# Patient Record
Sex: Male | Born: 1955 | Race: White | Hispanic: No | Smoking: Former smoker
Health system: Southern US, Community
[De-identification: ages and names within clinical notes are randomized; demographics above are authoritative.]

## PROBLEM LIST (undated history)

## (undated) DIAGNOSIS — G4700 Insomnia, unspecified: Secondary | ICD-10-CM

## (undated) DIAGNOSIS — F419 Anxiety disorder, unspecified: Secondary | ICD-10-CM

## (undated) DIAGNOSIS — J449 Chronic obstructive pulmonary disease, unspecified: Secondary | ICD-10-CM

## (undated) DIAGNOSIS — I10 Essential (primary) hypertension: Secondary | ICD-10-CM

## (undated) DIAGNOSIS — K219 Gastro-esophageal reflux disease without esophagitis: Secondary | ICD-10-CM

## (undated) DIAGNOSIS — Z87891 Personal history of nicotine dependence: Secondary | ICD-10-CM

## (undated) DIAGNOSIS — E119 Type 2 diabetes mellitus without complications: Secondary | ICD-10-CM

## (undated) HISTORY — DX: Insomnia, unspecified: G47.00

## (undated) HISTORY — DX: Chronic obstructive pulmonary disease, unspecified: J44.9

## (undated) HISTORY — DX: Gastro-esophageal reflux disease without esophagitis: K21.9

## (undated) HISTORY — DX: Type 2 diabetes mellitus without complications: E11.9

## (undated) HISTORY — DX: Anxiety disorder, unspecified: F41.9

## (undated) HISTORY — DX: Essential (primary) hypertension: I10

## (undated) HISTORY — DX: Personal history of nicotine dependence: Z87.891

---

## 2001-05-26 HISTORY — PX: HERNIA REPAIR: SHX51

## 2004-10-13 ENCOUNTER — Emergency Department: Payer: Self-pay | Admitting: Emergency Medicine

## 2008-02-14 ENCOUNTER — Other Ambulatory Visit: Payer: Self-pay

## 2008-02-14 ENCOUNTER — Inpatient Hospital Stay: Payer: Self-pay | Admitting: Psychiatry

## 2009-04-20 ENCOUNTER — Emergency Department: Payer: Self-pay | Admitting: Emergency Medicine

## 2009-08-27 ENCOUNTER — Emergency Department: Payer: Self-pay | Admitting: Unknown Physician Specialty

## 2010-01-20 ENCOUNTER — Emergency Department: Payer: Self-pay | Admitting: Internal Medicine

## 2010-08-01 ENCOUNTER — Emergency Department: Payer: Self-pay | Admitting: Emergency Medicine

## 2010-08-11 ENCOUNTER — Emergency Department: Payer: Self-pay | Admitting: Emergency Medicine

## 2010-09-03 ENCOUNTER — Emergency Department: Payer: Self-pay | Admitting: Unknown Physician Specialty

## 2010-09-28 ENCOUNTER — Emergency Department: Payer: Self-pay | Admitting: Emergency Medicine

## 2010-10-26 ENCOUNTER — Emergency Department: Payer: Self-pay | Admitting: Emergency Medicine

## 2010-12-08 ENCOUNTER — Emergency Department: Payer: Self-pay | Admitting: Emergency Medicine

## 2011-01-25 ENCOUNTER — Inpatient Hospital Stay: Payer: Self-pay | Admitting: Psychiatry

## 2011-02-27 ENCOUNTER — Emergency Department: Payer: Self-pay | Admitting: Emergency Medicine

## 2011-04-07 ENCOUNTER — Ambulatory Visit: Payer: Self-pay | Admitting: Adult Health

## 2011-11-09 ENCOUNTER — Emergency Department: Payer: Self-pay | Admitting: Emergency Medicine

## 2011-11-09 LAB — URINALYSIS, COMPLETE
Bacteria: NONE SEEN
Blood: NEGATIVE
Glucose,UR: >=500 mg/dL (ref 0–75)
Leukocyte Esterase: NEGATIVE
Nitrite: NEGATIVE
RBC,UR: <1 /HPF (ref 0–5)
Squamous Epithelial: NONE SEEN

## 2011-11-09 LAB — COMPREHENSIVE METABOLIC PANEL
Albumin: 3 g/dL — ABNORMAL LOW (ref 3.4–5.0)
Alkaline Phosphatase: 93 U/L (ref 50–136)
Anion Gap: 7 (ref 7–16)
BUN: 16 mg/dL (ref 7–18)
Bilirubin,Total: 0.6 mg/dL (ref 0.2–1.0)
Calcium, Total: 8.7 mg/dL (ref 8.5–10.1)
Chloride: 103 mmol/L (ref 98–107)
Creatinine: 0.74 mg/dL (ref 0.60–1.30)
EGFR (Non-African Amer.): >60
Glucose: 269 mg/dL — ABNORMAL HIGH (ref 65–99)
Osmolality: 281 (ref 275–301)
Potassium: 4.2 mmol/L (ref 3.5–5.1)
SGOT(AST): 174 U/L — ABNORMAL HIGH (ref 15–37)
SGPT (ALT): 346 U/L — ABNORMAL HIGH
Sodium: 135 mmol/L — ABNORMAL LOW (ref 136–145)

## 2011-11-09 LAB — CBC
HGB: 14.5 g/dL (ref 13.0–18.0)
MCH: 32.8 pg (ref 26.0–34.0)
Platelet: 111 10*3/uL — ABNORMAL LOW (ref 150–440)
WBC: 6.7 10*3/uL (ref 3.8–10.6)

## 2011-11-09 LAB — TROPONIN I: Troponin-I: <0.02 ng/mL

## 2011-11-09 LAB — CK TOTAL AND CKMB (NOT AT ARMC): CK, Total: 52 U/L (ref 35–232)

## 2012-12-12 ENCOUNTER — Emergency Department: Payer: Self-pay | Admitting: Internal Medicine

## 2012-12-12 LAB — URINALYSIS, COMPLETE
Bacteria: NONE SEEN
Bilirubin,UR: NEGATIVE
Blood: NEGATIVE
Glucose,UR: NEGATIVE mg/dL
Ketone: NEGATIVE
Leukocyte Esterase: NEGATIVE
Nitrite: NEGATIVE
Ph: 5
Protein: NEGATIVE
RBC,UR: <1 /HPF
Specific Gravity: 1.019
Squamous Epithelial: NONE SEEN
WBC UR: 1 /HPF

## 2012-12-12 LAB — COMPREHENSIVE METABOLIC PANEL
Albumin: 3.3 g/dL — ABNORMAL LOW (ref 3.4–5.0)
Alkaline Phosphatase: 96 U/L (ref 50–136)
Anion Gap: 3 — ABNORMAL LOW (ref 7–16)
Bilirubin,Total: 0.8 mg/dL (ref 0.2–1.0)
Calcium, Total: 9.1 mg/dL (ref 8.5–10.1)
Co2: 28 mmol/L (ref 21–32)
Creatinine: 0.98 mg/dL (ref 0.60–1.30)
EGFR (African American): >60
Osmolality: 274 (ref 275–301)
SGPT (ALT): 253 U/L — ABNORMAL HIGH (ref 12–78)

## 2012-12-12 LAB — CBC
HCT: 38.6 % — ABNORMAL LOW (ref 40.0–52.0)
HGB: 13.3 g/dL (ref 13.0–18.0)
MCH: 31.3 pg (ref 26.0–34.0)
MCV: 91 fL (ref 80–100)
Platelet: 144 10*3/uL — ABNORMAL LOW (ref 150–440)
RBC: 4.26 10*6/uL — ABNORMAL LOW (ref 4.40–5.90)
WBC: 8.3 10*3/uL (ref 3.8–10.6)

## 2013-02-01 ENCOUNTER — Ambulatory Visit: Payer: Self-pay | Admitting: Surgery

## 2013-02-01 LAB — CBC WITH DIFFERENTIAL/PLATELET
Basophil #: 0 10*3/uL (ref 0.0–0.1)
Basophil %: 0.5 %
Eosinophil #: 0.2 10*3/uL (ref 0.0–0.7)
Eosinophil %: 2.6 %
HCT: 40.1 % (ref 40.0–52.0)
HGB: 14.3 g/dL (ref 13.0–18.0)
MCHC: 35.8 g/dL (ref 32.0–36.0)
MCV: 93 fL (ref 80–100)
Monocyte %: 6.9 %
Neutrophil %: 59.9 %
RBC: 4.29 10*6/uL — ABNORMAL LOW (ref 4.40–5.90)
WBC: 6.1 10*3/uL (ref 3.8–10.6)

## 2013-02-01 LAB — BASIC METABOLIC PANEL
BUN: 11 mg/dL (ref 7–18)
Chloride: 105 mmol/L (ref 98–107)
Co2: 28 mmol/L (ref 21–32)
Creatinine: 0.86 mg/dL (ref 0.60–1.30)
EGFR (African American): >60
EGFR (Non-African Amer.): >60
Glucose: 150 mg/dL — ABNORMAL HIGH (ref 65–99)
Osmolality: 276 (ref 275–301)
Potassium: 4.3 mmol/L (ref 3.5–5.1)

## 2013-02-01 LAB — HEPATIC FUNCTION PANEL A (ARMC)
Alkaline Phosphatase: 89 U/L (ref 50–136)
Bilirubin,Total: 0.5 mg/dL (ref 0.2–1.0)
SGOT(AST): 137 U/L — ABNORMAL HIGH (ref 15–37)
SGPT (ALT): 215 U/L — ABNORMAL HIGH (ref 12–78)

## 2013-02-01 LAB — PROTIME-INR
INR: 0.9
Prothrombin Time: 12.7 secs (ref 11.5–14.7)

## 2013-02-04 ENCOUNTER — Emergency Department: Payer: Self-pay | Admitting: Emergency Medicine

## 2013-02-04 ENCOUNTER — Ambulatory Visit: Payer: Self-pay | Admitting: Surgery

## 2013-02-05 ENCOUNTER — Emergency Department: Payer: Self-pay | Admitting: Emergency Medicine

## 2013-02-05 LAB — URINALYSIS, COMPLETE
Bacteria: NONE SEEN
Glucose,UR: NEGATIVE mg/dL (ref 0–75)
Hyaline Cast: 3
Nitrite: NEGATIVE
Specific Gravity: 1.023 (ref 1.003–1.030)

## 2013-02-06 LAB — COMPREHENSIVE METABOLIC PANEL
Albumin: 3.1 g/dL — ABNORMAL LOW (ref 3.4–5.0)
Alkaline Phosphatase: 90 U/L (ref 50–136)
BUN: 9 mg/dL (ref 7–18)
Bilirubin,Total: 0.7 mg/dL (ref 0.2–1.0)
Calcium, Total: 8.9 mg/dL (ref 8.5–10.1)
Chloride: 107 mmol/L (ref 98–107)
Co2: 23 mmol/L (ref 21–32)
Creatinine: 0.76 mg/dL (ref 0.60–1.30)
EGFR (African American): >60
EGFR (Non-African Amer.): >60
Osmolality: 273 (ref 275–301)
Potassium: 3.8 mmol/L (ref 3.5–5.1)
SGOT(AST): 91 U/L — ABNORMAL HIGH (ref 15–37)
SGPT (ALT): 144 U/L — ABNORMAL HIGH (ref 12–78)
Sodium: 137 mmol/L (ref 136–145)
Total Protein: 7.7 g/dL (ref 6.4–8.2)

## 2013-02-06 LAB — CBC
HGB: 13 g/dL (ref 13.0–18.0)
MCH: 33 pg (ref 26.0–34.0)
RBC: 3.95 10*6/uL — ABNORMAL LOW (ref 4.40–5.90)
RDW: 13.5 % (ref 11.5–14.5)

## 2013-02-07 ENCOUNTER — Emergency Department: Payer: Self-pay | Admitting: Emergency Medicine

## 2013-02-07 LAB — URINALYSIS, COMPLETE
Bilirubin,UR: NEGATIVE
Nitrite: NEGATIVE
Ph: 6 (ref 4.5–8.0)
RBC,UR: 833 /HPF (ref 0–5)
Specific Gravity: 1.015 (ref 1.003–1.030)
WBC UR: 7 /HPF (ref 0–5)

## 2013-05-18 IMAGING — CT CT HEAD WITHOUT CONTRAST
3 of 4 series · 18 of 30 positions shown, 20 images · non-contrast
Comparison: none

REASON FOR EXAM: FALL, SCALP ABRASIONS
COMMENTS:   May transport without cardiac monitor

[Series 2: without · axial · non-contrast · 0.40mm/px · z∈[+1168,+1278]mm · 8 of 30 slices shown, 10 images (1 of 2)]
[im 4/30  brain]
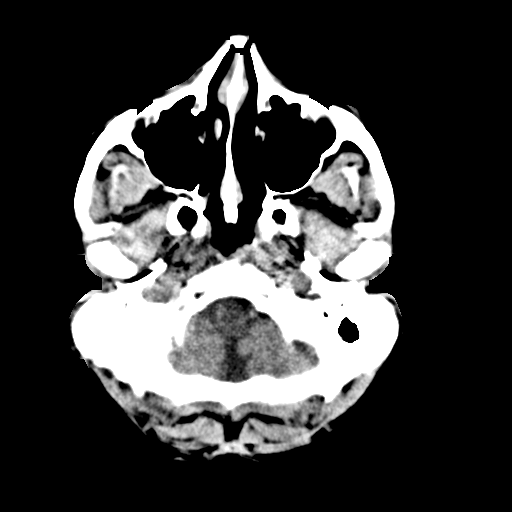
[im 4/30  bone]
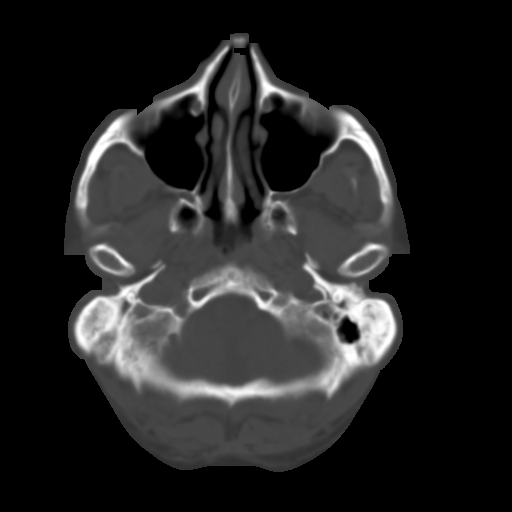
[im 7/30  brain]
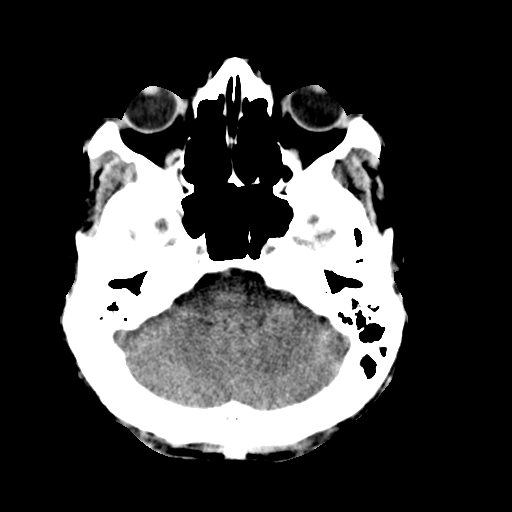
[im 10/30  brain]
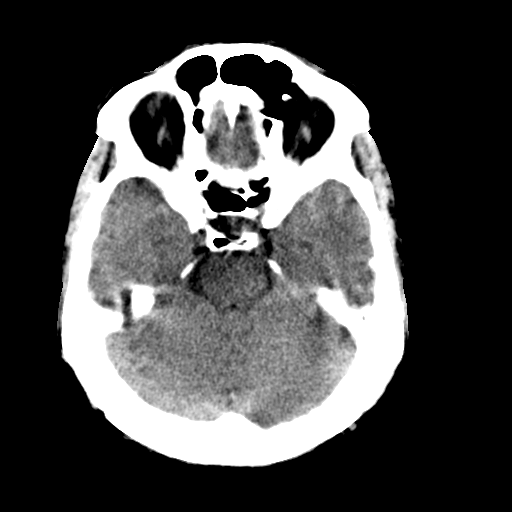
[im 13/30  brain]
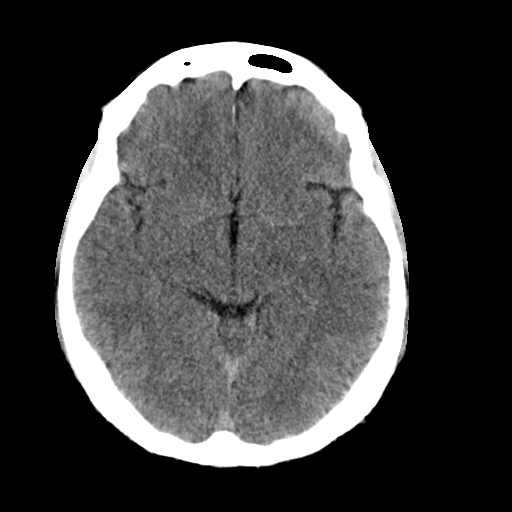
[im 17/30  brain]
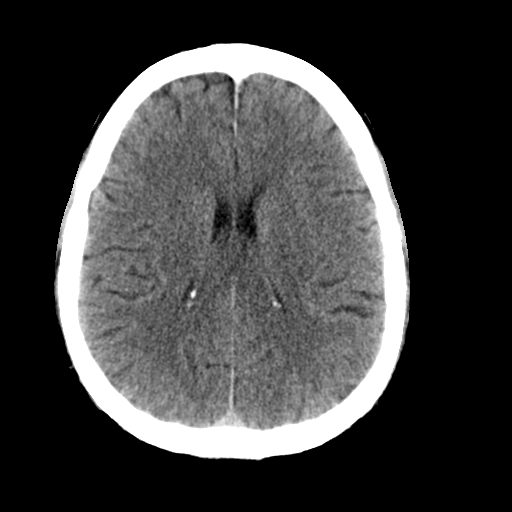
[im 17/30  bone]
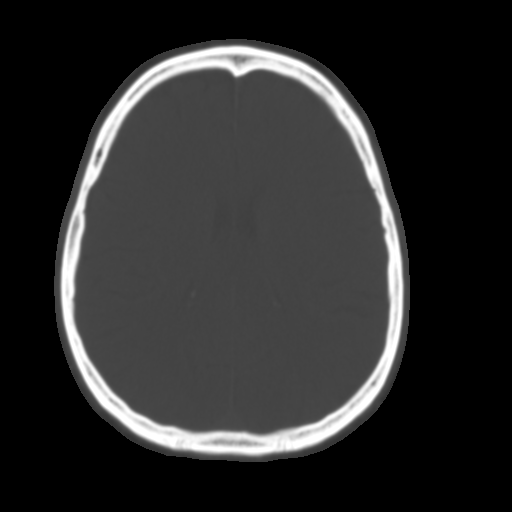
[im 20/30  brain]
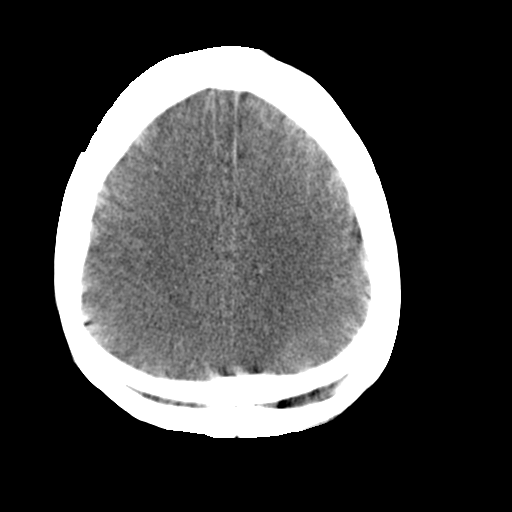
[im 23/30  brain]
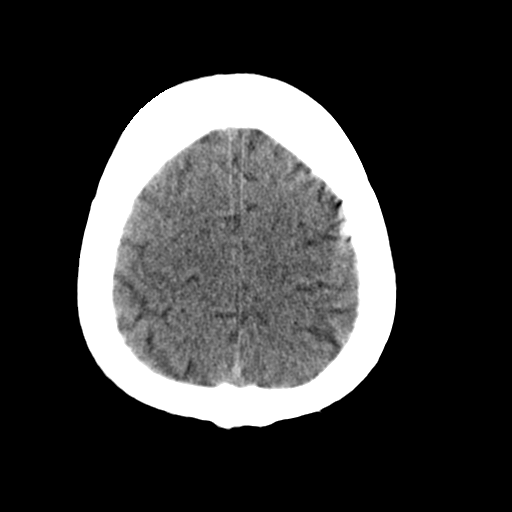
[im 26/30  brain]
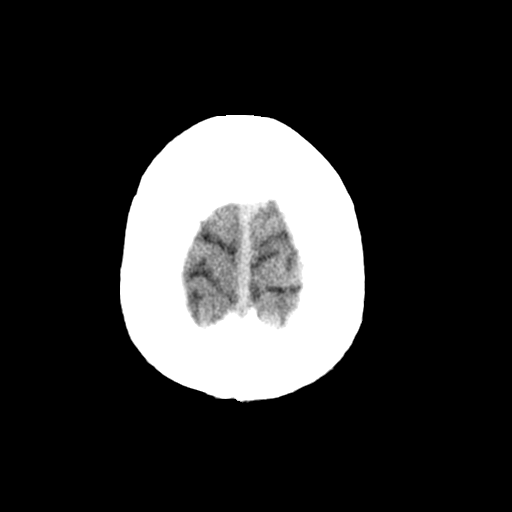

[Series 3: bone · axial · 0.40mm/px · z∈[+1168,+1262]mm · 7 of 30 slices shown]
[im 4/30  bone]
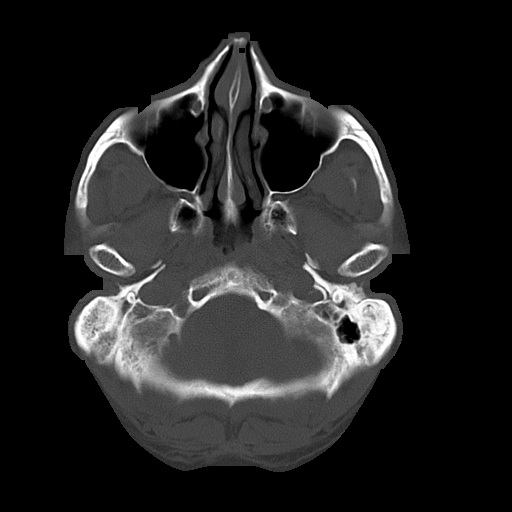
[im 7/30  bone]
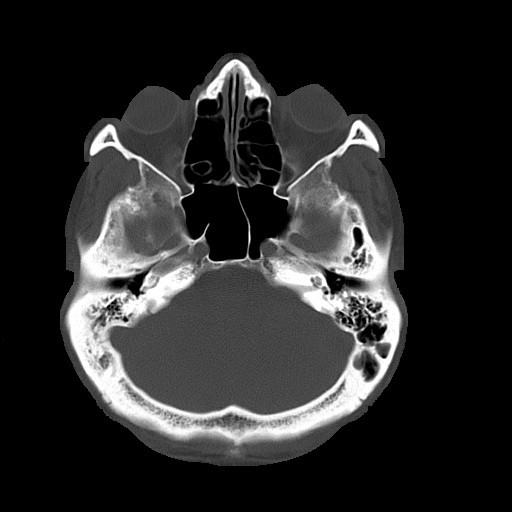
[im 10/30  bone]
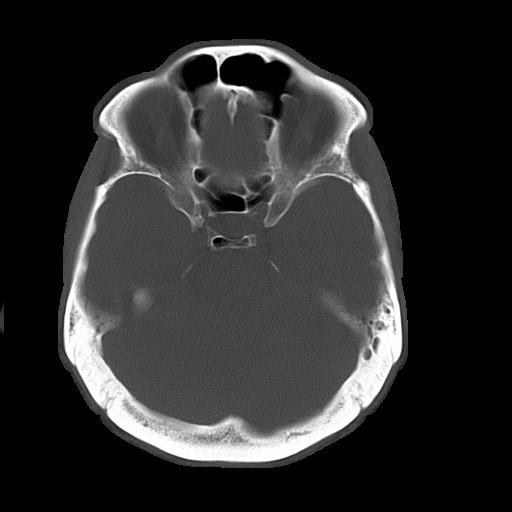
[im 13/30  bone]
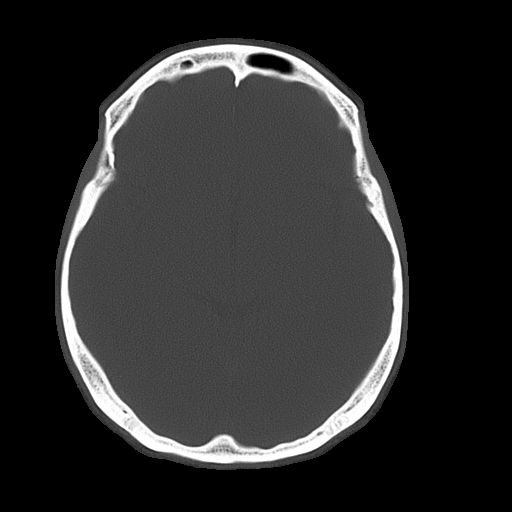
[im 17/30  bone]
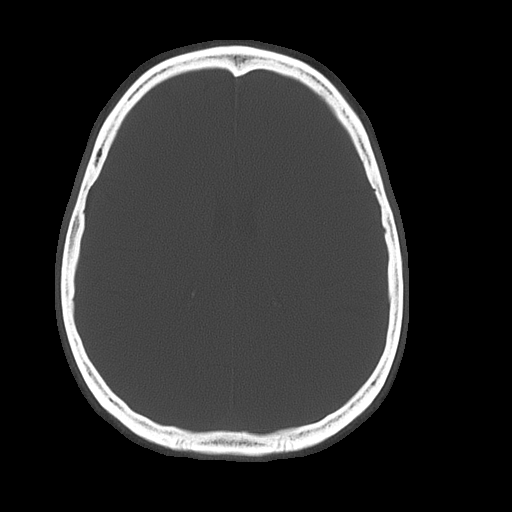
[im 20/30  bone]
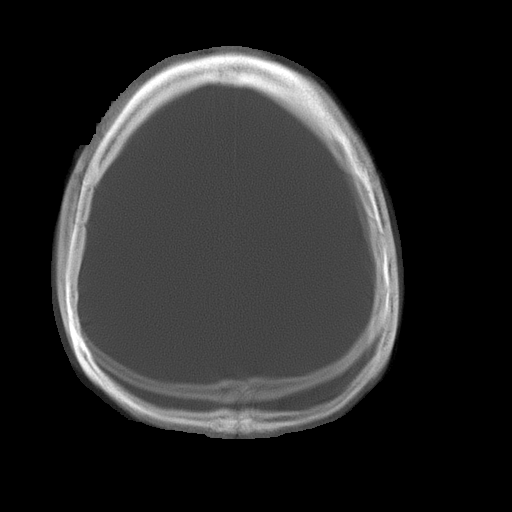
[im 23/30  bone]
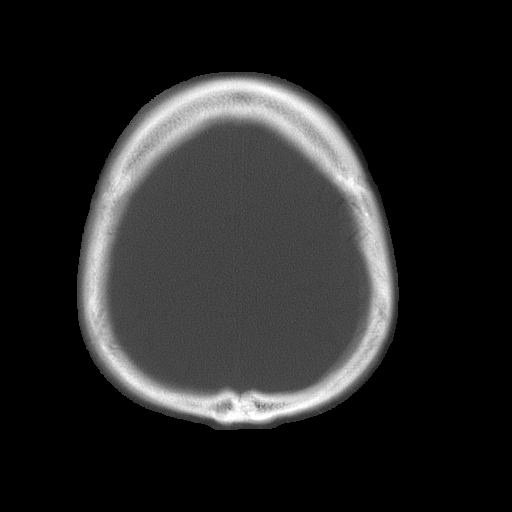

[Series 4: without · axial · non-contrast · 0.40mm/px · z∈[+1246,+1276]mm · 3 of 14 slices shown (2 of 2)]
[im 4/14  brain]
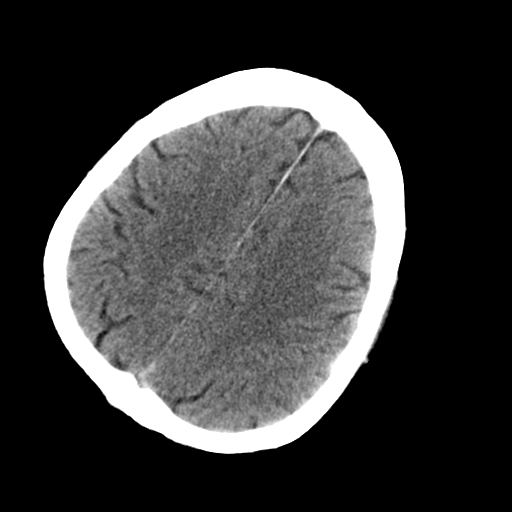
[im 7/14  brain]
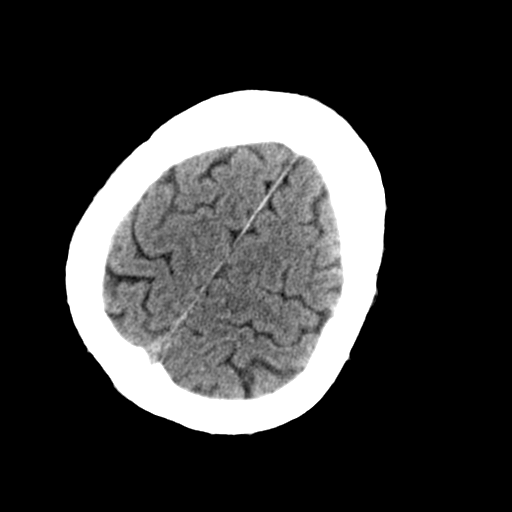
[im 10/14  brain]
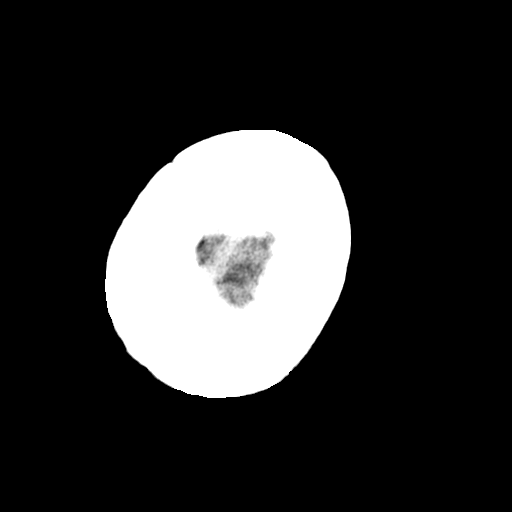

[18 of 30 positions shown; findings below may reference images not displayed]

PROCEDURE:     CT  - CT HEAD WITHOUT CONTRAST  - January 25, 2011  [DATE]

RESULT:     Emergent noncontrast CT of the brain is compared to the previous
examination from 10/26/2010.

The ventricles and sulci are normal. There is no hemorrhage. There is no
focal mass, mass-effect or midline shift. There is no evidence of edema or
territorial infarct. The bone windows demonstrate normal aeration of the
paranasal sinuses and mastoid air cells. There is no skull fracture
demonstrated.
IMPRESSION: 1. No acute intracranial abnormality.

## 2013-06-20 IMAGING — CR DG CHEST 2V
1 series · 2 of 2 positions shown · non-contrast
Comparison: none

REASON FOR EXAM: CP, SOB
COMMENTS:   LMP: (Male)

PROCEDURE:     DXR - DXR CHEST PA (OR AP) AND LATERAL  - February 27, 2011  [DATE]
RESULT:     Comparison is made to the prior exam of 08/01/2010. The lung
fields are clear. The heart, mediastinal and osseous structures reveal no
significant abnormalities.

[Series 1: view not recorded · 0.17mm/px · 2 of 2 slices shown]
[im 1/2]
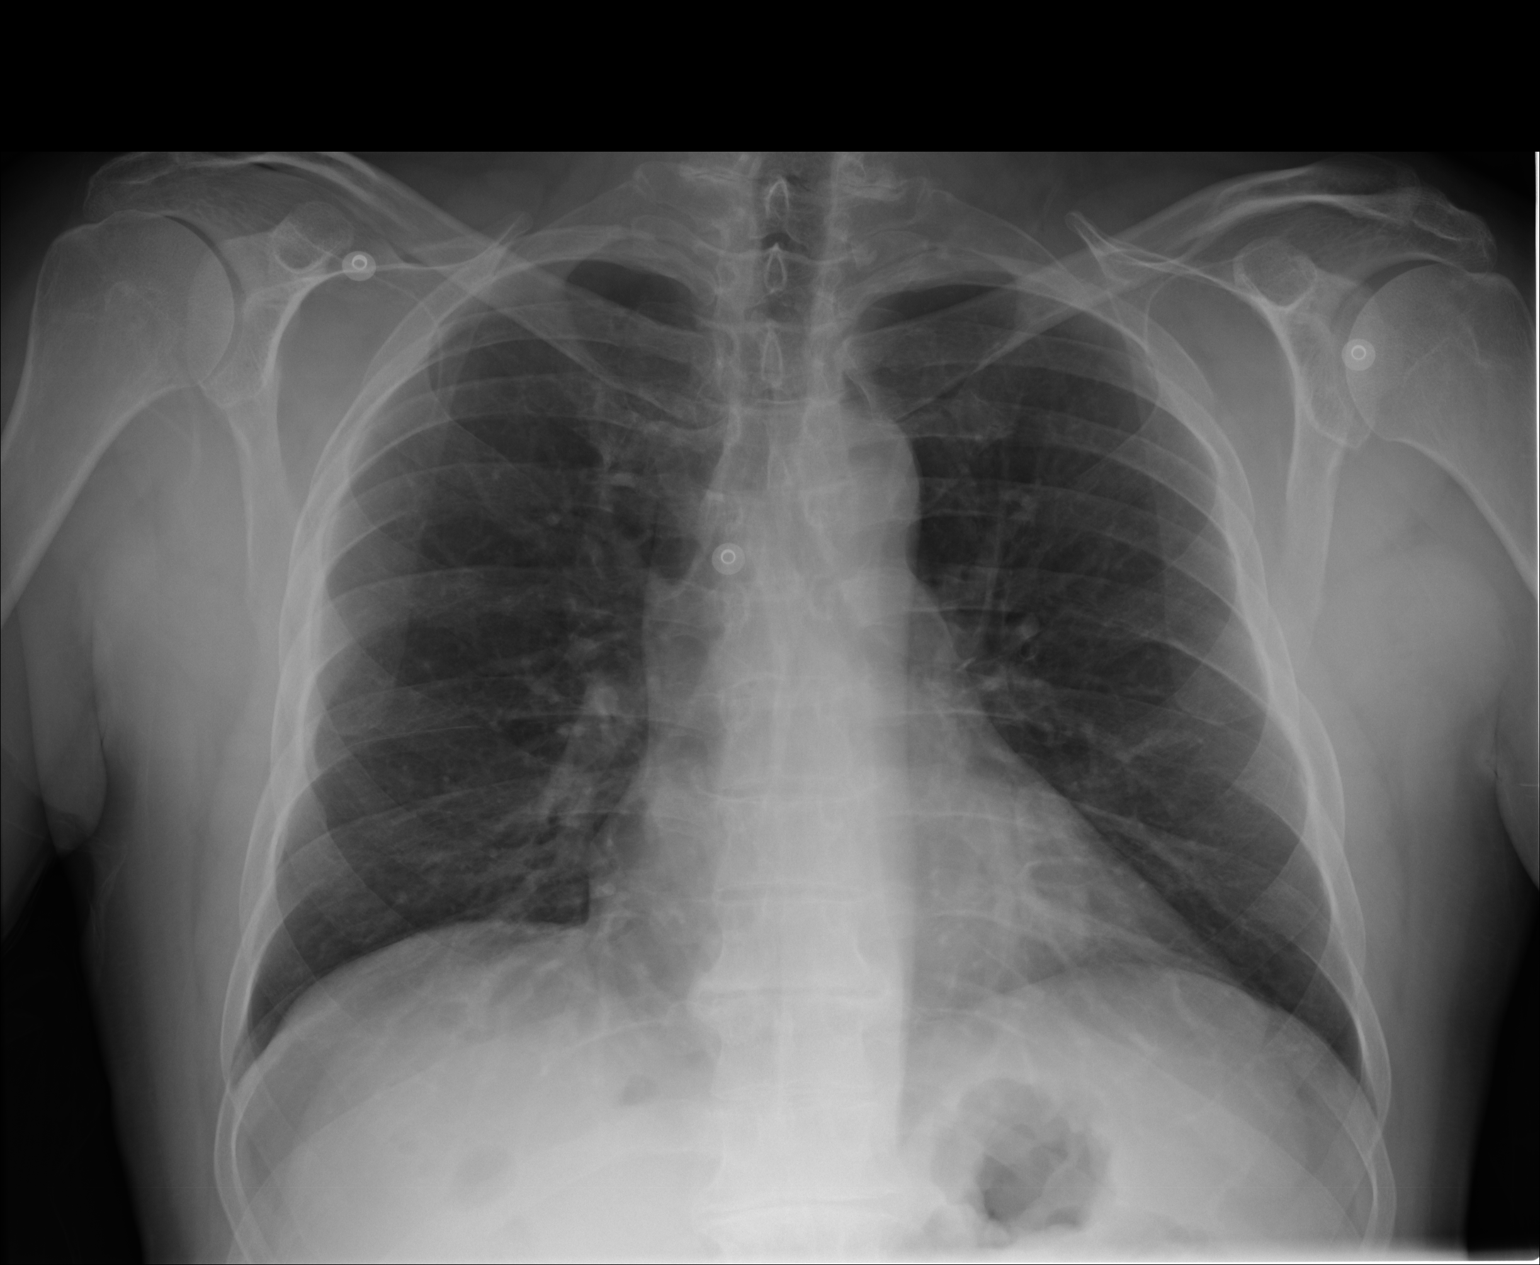
[im 2/2]
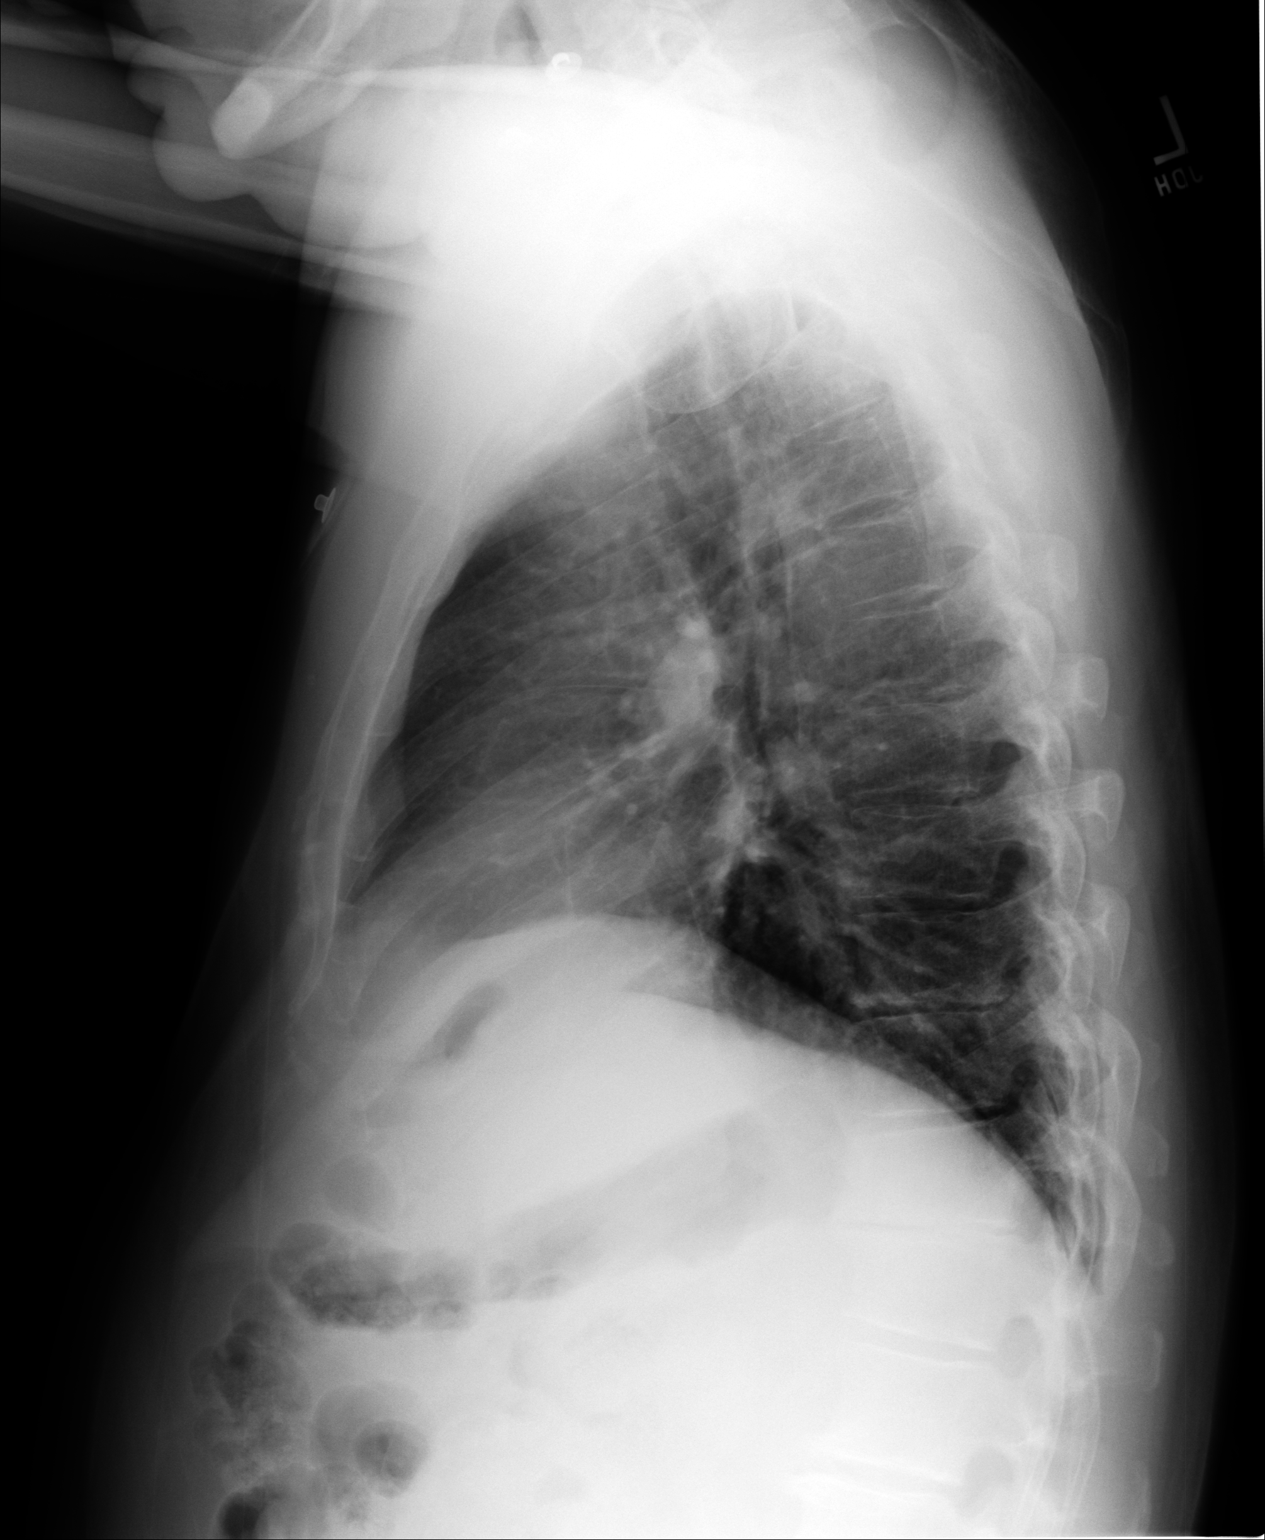

[2 of 2 positions shown; findings below may reference images not displayed]

IMPRESSION: No acute changes are identified.

## 2013-10-16 ENCOUNTER — Emergency Department: Payer: Self-pay | Admitting: Emergency Medicine

## 2014-06-08 DIAGNOSIS — B182 Chronic viral hepatitis C: Secondary | ICD-10-CM | POA: Insufficient documentation

## 2014-06-14 ENCOUNTER — Ambulatory Visit: Payer: Self-pay | Admitting: Gastroenterology

## 2014-06-26 ENCOUNTER — Ambulatory Visit: Payer: Self-pay | Admitting: Gastroenterology

## 2014-07-10 ENCOUNTER — Ambulatory Visit: Payer: Self-pay | Admitting: Gastroenterology

## 2014-07-10 LAB — HM COLONOSCOPY

## 2014-09-15 NOTE — Op Note (Signed)
PATIENT NAME:  [name], [name] MR#:  [id number] DATE OF BIRTH:  06-01-1955  DATE OF PROCEDURE:  02/04/2013  PREOPERATIVE DIAGNOSIS:  Bilateral inguinal hernias, umbilical hernia.   POSTOPERATIVE DIAGNOSIS:  Left inguinal hernia, direct and umbilical hernia.   PROCEDURE PERFORMED:  Transabdominal preperitoneal left inguinal hernia repair with mesh, left and umbilical hernia repair.   ESTIMATED BLOOD LOSS:  15 mL.   COMPLICATIONS:  None.   SPECIMEN:  None.   FINDINGS:  Left direct inguinal hernia.   INDICATION FOR SURGERY:  Mr. Drew is a pleasant 59 year old male with history of left inguinal hernia which has been causing him discomfort.  He would like it repeated.  I also thought that he had a right inguinal hernia and had a definite umbilical hernia.   DETAILS OF PROCEDURE ARE AS FOLLOWS:  Informed consent was obtained.  Mr. Kant was brought to the Operating Room suite.  His supraumbilical incision was made.  This was deepened down to the fascia.  The fascia was incised.  I decided to access the abdomen with the umbilical hernia.  I thus went around the stalk of the umbilical hernia with a Kelly clamp and divided it.  There was a small defect about the size of the [facility] trocar.  I placed two stay sutures through the fascia and placed the [facility] in the abdomen.  The abdomen was insufflated.  The camera was placed through the [facility] trocar.  I then looked at his abdominal wall.  There was a definite direct inguinal hernia on the right.  He did have colon that was stuck to the abdominal wall in the area of the hernia.  I thus bluntly dissected sigmoid colon away from the hernia.  I then incised the peritoneum using hot scissors on the abdominal wall laterally and proceeded medially and proceeded down and I dissected the peritoneum out of the direct hernia which again was quite small.  I dissected out Cooper's ligament and dissected out the cord.  There was no obvious peritoneum and I encircled the  cord and then decided to place prolene mesh.  I used an approximately 4 x 6 cm piece of Prolene mesh which I made an incision halfway during the length of the mesh to allow me to recreate the internal ring.  The mesh was then tacked to the anterior abdominal wall medially and Cooper's ligament.  I then encircled the cord with the tails of the mesh and recreated the ring by tacking a suture through both pieces of mesh.  I then tacked the mesh to the abdominal wall laterally.  It appeared to fit well and cover all three defects.  We tacked the peritoneum up to the abdominal wall using tacks.  I then irrigated the abdomen.  There were no obvious large exposed mesh defects and then I removed all trocars under direct visualization.  The umbilical hernia was closed with a figure-of-eight 0 Vicryl.  I then re-tacked the umbilicus to the fascial repair using two 3-0 Vicryl sutures.  I then closed the port sites using an interrupted deep dermal 4-0 Monocryl.  I then placed Steri-Strips, Telfa gauze and Tegaderm over the wounds.  The patient was then awoken, extubated and brought to the postanesthesia care unit.  There were no immediate complications.  Needle, sponge and instrument counts correct at the end of the procedure.    ____________________________ Glena Norfolk. Camdyn Laden, MD cal:ea D: 02/04/2013 12:17:08 ET T: 02/04/2013 22:26:20 ET JOB#: [id number]  cc: Harrell Gave A. Tida Saner,  MD, <Dictator> Floyde Parkins MD ELECTRONICALLY SIGNED 02/08/2013 11:44

## 2014-09-18 LAB — SURGICAL PATHOLOGY

## 2015-05-31 ENCOUNTER — Encounter: Payer: Self-pay | Admitting: Family Medicine

## 2015-05-31 ENCOUNTER — Other Ambulatory Visit: Payer: Self-pay | Admitting: Family Medicine

## 2015-05-31 DIAGNOSIS — Z87891 Personal history of nicotine dependence: Secondary | ICD-10-CM

## 2015-05-31 HISTORY — DX: Personal history of nicotine dependence: Z87.891

## 2015-06-01 ENCOUNTER — Inpatient Hospital Stay: Payer: Medicaid Other | Attending: Family Medicine | Admitting: Family Medicine

## 2015-06-01 ENCOUNTER — Ambulatory Visit
Admission: RE | Admit: 2015-06-01 | Discharge: 2015-06-01 | Disposition: A | Discharge status: Home / Self Care | Payer: Medicaid Other | Source: Ambulatory Visit | Attending: Family Medicine | Admitting: Family Medicine

## 2015-06-01 ENCOUNTER — Telehealth: Payer: Self-pay | Admitting: *Deleted

## 2015-06-01 DIAGNOSIS — Z122 Encounter for screening for malignant neoplasm of respiratory organs: Secondary | ICD-10-CM | POA: Diagnosis not present

## 2015-06-01 DIAGNOSIS — J432 Centrilobular emphysema: Secondary | ICD-10-CM | POA: Diagnosis not present

## 2015-06-01 DIAGNOSIS — I708 Atherosclerosis of other arteries: Secondary | ICD-10-CM | POA: Diagnosis not present

## 2015-06-01 DIAGNOSIS — Z87891 Personal history of nicotine dependence: Secondary | ICD-10-CM | POA: Insufficient documentation

## 2015-06-01 DIAGNOSIS — K8020 Calculus of gallbladder without cholecystitis without obstruction: Secondary | ICD-10-CM | POA: Insufficient documentation

## 2015-06-01 NOTE — Telephone Encounter (Signed)
IMPRESSION: 1. Lung-RADS Category 1, negative. Continue annual screening with low-dose chest CT without contrast in 12 months. Centrilobular emphysema, without suspicious pulmonary nodule or mass. 2. Atherosclerosis, including within the coronary arteries. LAD versus possible distal left main coronary artery atherosclerosis. Correlate with risk factors. Consider cardiology consultation. 3. Hepatic morphology, suspicious for cirrhosis. Correlate with risk factors. Consider dedicated abdominal imaging (i.e. the pre and postcontrast abdominal MRI or ultrasound elastography). 4. Cholelithiasis. These results will be called to the ordering clinician or representative by the Radiologist Assistant, and communication documented in the PACS or zVision Dashboard.   Electronically Signed  By: Abigail Miyamoto M.D.  On: 06/01/2015 15:36

## 2015-06-01 NOTE — Progress Notes (Signed)
In accordance with CMS guidelines, patient has meet eligibility criteria including age, absence of signs or symptoms of lung cancer, the specific calculation of cigarette smoking pack-years was 50 years and is a current smoker.   A shared decision-making session was conducted prior to the performance of CT scan. This includes one or more decision aids, includes benefits and harms of screening, follow-up diagnostic testing, over-diagnosis, false positive rate, and total radiation exposure.  Counseling on the importance of adherence to annual lung cancer LDCT screening, impact of co-morbidities, and ability or willingness to undergo diagnosis and treatment is imperative for compliance of the program.  Counseling on the importance of continued smoking cessation for former smokers; the importance of smoking cessation for current smokers and information about tobacco cessation interventions have been given to patient including the [place] [facility], 1800 quit [facility], as well as Cancer Center specific smoking cessation programs.  Written order for lung cancer screening with LDCT has been given to the patient and any and all questions have been answered to the best of my abilities.   Yearly follow up will be scheduled by Shawn Perkins, Thoracic Navigator.   

## 2015-06-05 ENCOUNTER — Telehealth: Payer: Self-pay | Admitting: *Deleted

## 2015-06-05 NOTE — Telephone Encounter (Signed)
Notified patient of LDCT lung cancer screening results of Lung Rads 1 finding with recommendation for 12 month follow up imaging. Also notified of incidental finding noted below. Patient verbalizes understanding. Results will be sent to referring provider.  IMPRESSION: 1. Lung-RADS Category 1, negative. Continue annual screening with low-dose chest CT without contrast in 12 months. Centrilobular emphysema, without suspicious pulmonary nodule or mass. 2. Atherosclerosis, including within the coronary arteries. LAD versus possible distal left main coronary artery atherosclerosis. Correlate with risk factors. Consider cardiology consultation. 3. Hepatic morphology, suspicious for cirrhosis. Correlate with risk factors. Consider dedicated abdominal imaging (i.e. the pre and postcontrast abdominal MRI or ultrasound elastography). 4. Cholelithiasis. These results will be called to the ordering clinician or representative by the Radiologist Assistant, and communication documented in the PACS or zVision Dashboard.

## 2015-09-24 ENCOUNTER — Other Ambulatory Visit: Payer: Self-pay | Admitting: Physician Assistant

## 2015-09-24 DIAGNOSIS — R9439 Abnormal result of other cardiovascular function study: Secondary | ICD-10-CM

## 2015-09-28 ENCOUNTER — Ambulatory Visit
Admission: RE | Admit: 2015-09-28 | Discharge: 2015-09-28 | Disposition: A | Discharge status: Home / Self Care | Payer: Medicaid Other | Source: Ambulatory Visit | Attending: Physician Assistant | Admitting: Physician Assistant

## 2015-09-28 DIAGNOSIS — R9439 Abnormal result of other cardiovascular function study: Secondary | ICD-10-CM | POA: Insufficient documentation

## 2015-09-28 LAB — NM MYOCAR MULTI W/SPECT W/WALL MOTION / EF
CHL CUP NUCLEAR SRS: 0
CHL CUP NUCLEAR SSS: 0
CHL CUP STRESS STAGE 1 HR: 60 {beats}/min
CHL CUP STRESS STAGE 2 HR: 61 {beats}/min
CHL CUP STRESS STAGE 3 GRADE: 0 %
CHL CUP STRESS STAGE 4 HR: 102 {beats}/min
CHL CUP STRESS STAGE 4 SBP: 160 mmHg
CHL CUP STRESS STAGE 5 GRADE: 12 %
CHL CUP STRESS STAGE 5 HR: 121 {beats}/min
CHL CUP STRESS STAGE 6 SPEED: 3.4 mph
CHL CUP STRESS STAGE 7 GRADE: 0 %
CHL CUP STRESS STAGE 8 DBP: 101 mmHg
CHL CUP STRESS STAGE 8 SBP: 156 mmHg
CSEPED: 6 min
CSEPEDS: 31 s
CSEPEW: 7.8 METS
CSEPPHR: 127 {beats}/min
LV dias vol: 46 mL (ref 62–150)
LVSYSVOL: 13 mL
NUC STRESS TID: 0.76
Percent of predicted max HR: 80 %
Rest HR: 60 {beats}/min
SDS: 0
Stage 2 Grade: 0 %
Stage 2 Speed: 0.1 mph
Stage 3 HR: 61 {beats}/min
Stage 3 Speed: 0.1 mph
Stage 4 DBP: 80 mmHg
Stage 4 Grade: 10 %
Stage 4 Speed: 1.7 mph
Stage 5 Speed: 2.5 mph
Stage 6 Grade: 14 %
Stage 6 HR: 127 {beats}/min
Stage 7 HR: 104 {beats}/min
Stage 7 Speed: 0 mph
Stage 8 Grade: 0 %
Stage 8 HR: 66 {beats}/min
Stage 8 Speed: 0 mph

## 2015-09-28 MED ORDER — TECHNETIUM TC 99M SESTAMIBI - CARDIOLITE
13.8100 | Freq: Once | INTRAVENOUS | Status: AC | PRN
  Administered 2015-09-28: 09:00:00 13.81 via INTRAVENOUS

## 2015-09-28 MED ORDER — TECHNETIUM TC 99M SESTAMIBI - CARDIOLITE
30.4200 | Freq: Once | INTRAVENOUS | Status: AC | PRN
  Administered 2015-09-28: 30.42 via INTRAVENOUS

## 2016-05-27 ENCOUNTER — Telehealth: Payer: Self-pay | Admitting: *Deleted

## 2016-05-27 DIAGNOSIS — Z87891 Personal history of nicotine dependence: Secondary | ICD-10-CM

## 2016-05-27 NOTE — Telephone Encounter (Signed)
Notified patient that annual lung cancer screening low dose CT scan is due. Confirmed that patient is within the age range of 55-77, and asymptomatic, (no signs or symptoms of lung cancer). Patient denies illness that would prevent curative treatment for lung cancer if found. The patient is a former smoker, quit 2 years ago with a 50 pack year history. The shared decision making visit was done 06/01/15. Patient is agreeable for CT scan being scheduled.

## 2016-06-10 ENCOUNTER — Ambulatory Visit: Admission: RE | Admit: 2016-06-10 | Payer: Medicaid Other | Source: Ambulatory Visit

## 2016-06-17 ENCOUNTER — Ambulatory Visit
Admission: RE | Admit: 2016-06-17 | Discharge: 2016-06-17 | Disposition: A | Discharge status: Home / Self Care | Payer: Medicaid Other | Source: Ambulatory Visit | Attending: Oncology | Admitting: Oncology

## 2016-06-17 ENCOUNTER — Encounter (INDEPENDENT_AMBULATORY_CARE_PROVIDER_SITE_OTHER): Payer: Self-pay

## 2016-06-17 DIAGNOSIS — I2510 Atherosclerotic heart disease of native coronary artery without angina pectoris: Secondary | ICD-10-CM | POA: Insufficient documentation

## 2016-06-17 DIAGNOSIS — I700 Atherosclerosis of aorta: Secondary | ICD-10-CM | POA: Insufficient documentation

## 2016-06-17 DIAGNOSIS — Z87891 Personal history of nicotine dependence: Secondary | ICD-10-CM | POA: Diagnosis present

## 2016-06-27 ENCOUNTER — Encounter: Payer: Self-pay | Admitting: *Deleted

## 2017-05-05 ENCOUNTER — Ambulatory Visit: Payer: Self-pay | Admitting: Internal Medicine

## 2017-06-18 ENCOUNTER — Telehealth: Payer: Self-pay | Admitting: *Deleted

## 2017-06-18 ENCOUNTER — Other Ambulatory Visit: Payer: Self-pay | Admitting: *Deleted

## 2017-06-18 DIAGNOSIS — Z87891 Personal history of nicotine dependence: Secondary | ICD-10-CM

## 2017-06-18 DIAGNOSIS — Z122 Encounter for screening for malignant neoplasm of respiratory organs: Secondary | ICD-10-CM

## 2017-06-18 NOTE — Telephone Encounter (Signed)
Notified patient that annual lung cancer screening low dose CT scan is due currently or will be in near future. Confirmed that patient is within the age range of 55-77, and asymptomatic, (no signs or symptoms of lung cancer). Patient denies illness that would prevent curative treatment for lung cancer if found. Verified smoking history, (former, quit 2016, 50 pack year). The shared decision making visit was done 06/01/15. Patient is agreeable for CT scan being scheduled.

## 2017-06-29 ENCOUNTER — Ambulatory Visit
Admission: RE | Admit: 2017-06-29 | Discharge: 2017-06-29 | Disposition: A | Discharge status: Home / Self Care | Payer: Medicaid Other | Source: Ambulatory Visit | Attending: Nurse Practitioner | Admitting: Nurse Practitioner

## 2017-06-29 DIAGNOSIS — Z87891 Personal history of nicotine dependence: Secondary | ICD-10-CM | POA: Insufficient documentation

## 2017-06-29 DIAGNOSIS — Z122 Encounter for screening for malignant neoplasm of respiratory organs: Secondary | ICD-10-CM | POA: Diagnosis not present

## 2017-06-29 DIAGNOSIS — I700 Atherosclerosis of aorta: Secondary | ICD-10-CM | POA: Diagnosis not present

## 2017-07-06 ENCOUNTER — Encounter: Payer: Self-pay | Admitting: *Deleted

## 2018-06-18 ENCOUNTER — Telehealth: Payer: Self-pay

## 2018-06-18 NOTE — Telephone Encounter (Signed)
Call pt regarding lung screening .Unable to leave voice mail.

## 2018-06-19 ENCOUNTER — Telehealth: Payer: Self-pay

## 2018-06-19 NOTE — Telephone Encounter (Signed)
Call pt regarding lung screening. No answer unable to leave message. PT  Has no voice mail.

## 2018-06-25 ENCOUNTER — Telehealth: Payer: Self-pay

## 2018-06-25 NOTE — Telephone Encounter (Signed)
Call pt regarding lung screening. Unable to get anyone to say hello. Someone picks up the phone holds it and laugh never say anything.

## 2018-07-27 ENCOUNTER — Other Ambulatory Visit: Payer: Self-pay

## 2018-07-27 ENCOUNTER — Ambulatory Visit (INDEPENDENT_AMBULATORY_CARE_PROVIDER_SITE_OTHER): Payer: Medicare Other | Admitting: Nurse Practitioner

## 2018-07-27 ENCOUNTER — Encounter: Payer: Self-pay | Admitting: Nurse Practitioner

## 2018-07-27 VITALS — BP 106/69 | HR 69 | Temp 98.4°F | Ht 66.0 in | Wt 155.0 lb

## 2018-07-27 DIAGNOSIS — E1169 Type 2 diabetes mellitus with other specified complication: Secondary | ICD-10-CM

## 2018-07-27 DIAGNOSIS — E1136 Type 2 diabetes mellitus with diabetic cataract: Secondary | ICD-10-CM

## 2018-07-27 DIAGNOSIS — J449 Chronic obstructive pulmonary disease, unspecified: Secondary | ICD-10-CM

## 2018-07-27 DIAGNOSIS — Z7689 Persons encountering health services in other specified circumstances: Secondary | ICD-10-CM

## 2018-07-27 DIAGNOSIS — I10 Essential (primary) hypertension: Secondary | ICD-10-CM

## 2018-07-27 DIAGNOSIS — K219 Gastro-esophageal reflux disease without esophagitis: Secondary | ICD-10-CM

## 2018-07-27 DIAGNOSIS — E119 Type 2 diabetes mellitus without complications: Secondary | ICD-10-CM

## 2018-07-27 DIAGNOSIS — J432 Centrilobular emphysema: Secondary | ICD-10-CM | POA: Insufficient documentation

## 2018-07-27 DIAGNOSIS — F5101 Primary insomnia: Secondary | ICD-10-CM | POA: Insufficient documentation

## 2018-07-27 DIAGNOSIS — E785 Hyperlipidemia, unspecified: Secondary | ICD-10-CM

## 2018-07-27 LAB — POCT GLYCOSYLATED HEMOGLOBIN (HGB A1C): Hemoglobin A1C: 5.8 % — AB (ref 4.0–5.6)

## 2018-07-27 MED ORDER — HYDROCHLOROTHIAZIDE 12.5 MG PO CAPS: ORAL_CAPSULE | ORAL | 1 refills | Status: DC

## 2018-07-27 MED ORDER — TRAZODONE HCL 100 MG PO TABS: 100.0000 mg | ORAL_TABLET | Freq: Every evening | ORAL | 1 refills | Status: DC | PRN

## 2018-07-27 MED ORDER — FAMOTIDINE 20 MG PO TABS: 20.0000 mg | ORAL_TABLET | Freq: Two times a day (BID) | ORAL | 1 refills | Status: DC

## 2018-07-27 MED ORDER — ALBUTEROL SULFATE (2.5 MG/3ML) 0.083% IN NEBU: 2.5000 mg | INHALATION_SOLUTION | Freq: Four times a day (QID) | RESPIRATORY_TRACT | 1 refills | Status: DC | PRN

## 2018-07-27 MED ORDER — LISINOPRIL 40 MG PO TABS: 40.0000 mg | ORAL_TABLET | Freq: Every day | ORAL | 1 refills | Status: DC

## 2018-07-27 MED ORDER — METFORMIN HCL 1000 MG PO TABS: ORAL_TABLET | ORAL | 1 refills | Status: DC

## 2018-07-27 MED ORDER — ATORVASTATIN CALCIUM 40 MG PO TABS: ORAL_TABLET | ORAL | 1 refills | Status: DC

## 2018-07-27 NOTE — Patient Instructions (Addendum)
[name],   Thank you for coming in to clinic today.  1. No changes to medications today.  Refills are sent to [facility].   2.  You will be due for FASTING BLOOD WORK.  This means you should eat no food or drink after midnight.  Drink only water or coffee without cream/sugar on the morning of your lab visit. - Please go ahead and schedule a "Lab Only" visit in the morning at the clinic for lab draw in the next 7 days. - Your results will be available about 2-3 days after blood draw.  If you have set up a MyChart account, you can can log in to MyChart online to view your results and a brief explanation. Also, we can discuss your results together at your next office visit if you would like.   3. Your provider would like to you have your annual eye exam. Please contact your current eye doctor or here are some good options for you to contact.   [facility]   Address: [address] [place], [place] [address] Phone: [phone]  Website: [email or web address] [address], [place], [place] [address] Phone: [phone] [email or web address]  [facility]  Address: [place], [place], [place] [address] Phone: [phone]   [facility] [address] [place], [place] [address] Phone: [phone]  [facility] Address: [place], [place],  [address]  Phone: [phone]   Please schedule a follow-up appointment with Cassell Smiles, AGNP. Return in about 3 months (around 10/27/2018) for diabetes, hypertension.  If you have any other questions or concerns, please feel free to call the clinic or send a message through [place]. You may also schedule an earlier appointment if necessary.  You will receive a survey after today's visit either digitally by e-mail or paper by [employer]. Your experiences and feedback matter to [place].  Please respond so we know how we are doing as we provide care for you.   Cassell Smiles,  DNP, AGNP-BC Adult Gerontology Nurse Practitioner [place]

## 2018-07-27 NOTE — Progress Notes (Signed)
Subjective:    Patient ID: [name], male    DOB: June 03, 1955, 63 y.o.   MRN: [id number]  [name] is a 63 y.o. male presenting on 07/27/2018 for Establish Care (diabetes )   HPI Establish Care New Provider Pt last seen [facility].  Obtain records.   Has not been seen in over 1 year.  Diabetes - Last A1c per patient report about 1 year ago was "about a 6."  He is checking at home.   - Patient notes he has cataracts that are newly diagnosed by his eye doctor.  Will have upcoming follow-up with [facility]. - Pt presents today for follow up of Type 2 diabetes mellitus. He is checking fasting am CBG at home with a range of 130s usually in am and at bedtime - Current diabetic medications include: metformin - He is not currently symptomatic.  - He denies polydipsia, polyphagia, polyuria, headaches, diaphoresis, shakiness, chills, pain, numbness or tingling in extremities and changes in vision.   - Clinical course has been stable. - He  reports an exercise routine that includes walking twice daily for about 20 minutes each, 6-7 days per week. - His diet is moderate in salt, moderate in fat, and moderate in carbohydrates. - Weight trend: stable  PREVENTION: Eye exam current (within one year): no Foot exam current (within one year): no  Lipid/ASCVD risk reduction - on statin: atorvastatin Kidney protection - on ace or arb: lisinopril Recent Labs    07/27/18 1635  HGBA1C 5.8*   Hypertension - He is not checking BP at home or outside of clinic.    - Current medications: lisinopril 40 mg once daily, hydrochlorothiazide 12.5 mg once daily, tolerating well without side effects - He is not currently symptomatic. - Pt denies headache, lightheadedness, dizziness, changes in vision, chest tightness/pressure, palpitations, leg swelling, sudden loss of speech or loss of consciousness.  COPD - not on medications.  Patient has no inhalers.  Patient  has used in past  Patient has nebulizer without need to use recently - Albuterol. Pt reports nose bleeds on inhaler.  Social History   Tobacco Use  . Smoking status: Former Smoker    Packs/day: 1.00    Years: 40.00    Pack years: 40.00    Types: Cigarettes    Last attempt to quit: 01/26/2018    Years since quitting: 0.4  . Smokeless tobacco: Never Used  Substance Use Topics  . Alcohol use: Not Currently  . Drug use: Never    Review of Systems  Constitutional: Negative for activity change, appetite change, fatigue and unexpected weight change.  HENT: Negative for congestion, hearing loss and trouble swallowing.   Eyes: Negative for visual disturbance.  Respiratory: Negative for choking, shortness of breath and wheezing.   Cardiovascular: Negative for chest pain and palpitations.  Gastrointestinal: Negative for abdominal pain, blood in stool, constipation and diarrhea.  Endocrine: Negative for cold intolerance, heat intolerance, polydipsia, polyphagia and polyuria.  Genitourinary: Negative for difficulty urinating.  Musculoskeletal: Negative for arthralgias, back pain and myalgias.  Skin: Negative for color change, rash and wound.  Allergic/Immunologic: Negative for environmental allergies and food allergies.  Neurological: Negative for dizziness, seizures, weakness and headaches.  Psychiatric/Behavioral: Positive for sleep disturbance. Negative for behavioral problems, decreased concentration, dysphoric mood and suicidal ideas. The patient is not nervous/anxious.    Per HPI unless specifically indicated above     Objective:    BP 106/69 (BP  Location: Right Arm, Patient Position: Sitting, Cuff Size: Normal)   Pulse 69   Temp 98.4 F (36.9 C) (Oral)   Ht 5\' 6"  (1.676 m)   Wt 155 lb (70.3 kg)   SpO2 98%   BMI 25.02 kg/m   Wt Readings from Last 3 Encounters:  07/27/18 155 lb (70.3 kg)  06/29/17 160 lb (72.6 kg)  06/17/16 160 lb (72.6 kg)    Physical Exam Vitals signs  reviewed.  Constitutional:      General: He is not in acute distress.    Appearance: Normal appearance. He is well-developed and normal weight.  HENT:     Head: Normocephalic and atraumatic.  Cardiovascular:     Rate and Rhythm: Normal rate and regular rhythm.     Pulses:          Radial pulses are 2+ on the right side and 2+ on the left side.       Posterior tibial pulses are 1+ on the right side and 1+ on the left side.     Heart sounds: Normal heart sounds, S1 normal and S2 normal.  Pulmonary:     Effort: Pulmonary effort is normal. No respiratory distress.     Breath sounds: Normal air entry. Decreased breath sounds present.  Musculoskeletal:     Right lower leg: No edema.     Left lower leg: No edema.  Skin:    General: Skin is warm and dry.     Capillary Refill: Capillary refill takes less than 2 seconds.  Neurological:     General: No focal deficit present.     Mental Status: He is alert and oriented to person, place, and time. Mental status is at baseline.  Psychiatric:        Attention and Perception: Attention normal.        Mood and Affect: Mood and affect normal.        Behavior: Behavior normal. Behavior is cooperative.        Thought Content: Thought content normal.        Judgment: Judgment normal.    Diabetic Foot Exam - Simple   Simple Foot Form Diabetic Foot exam was performed with the following findings:  Yes 07/27/2018  2:50 PM  Visual Inspection No deformities, no ulcerations, no other skin breakdown bilaterally:  Yes Sensation Testing Intact to touch and monofilament testing bilaterally:  Yes Pulse Check Posterior Tibialis and Dorsalis pulse intact bilaterally:  Yes Comments Patient is wearing well fitted and supportive shoes.        Assessment & Plan:   Problem List Items Addressed This Visit      Cardiovascular and Mediastinum   Essential hypertension Controlled hypertension.  BP goal < 130/80.  Pt is working on lifestyle modifications.   Taking medications tolerating well without side effects. No currently identified complications - needs kidney function check  Plan: 1. Continue taking medications today without changes. 2. Obtain labs today  3. Encouraged heart healthy diet and increasing exercise to 30 minutes most days of the week. 4. Check BP 1-2 x per week at home, keep log, and bring to clinic at next appointment. 5. Follow up 3 months.     Relevant Medications   atorvastatin (LIPITOR) 40 MG tablet   hydrochlorothiazide (MICROZIDE) 12.5 MG capsule   lisinopril (PRINIVIL,ZESTRIL) 40 MG tablet   Other Relevant Orders   COMPLETE METABOLIC PANEL WITH GFR (Completed)     Respiratory   Chronic obstructive pulmonary disease (HCC) Improved today  on exam.  Medications tolerated without side effects.  Continue at current doses.  Refills provided.  Consider future PFT assess severity. Pt declines today. Followup 3 months.    Relevant Medications   albuterol (PROVENTIL) (2.5 MG/3ML) 0.083% nebulizer solution     Digestive   Gastroesophageal reflux disease Currently well controlled on famotidine 20 mg bid.  He was recently changed from Zantac.  Plan: 1. Continue famotidine 20 mg one tab twice daily. Side effects discussed. Pt wants to continue med. 2. Avoid diet triggers. Reviewed need to seek care if globus sensation, difficulty swallowing, s/sx of GI bleed. 3. Follow up as needed and in 6 months.    Relevant Medications   famotidine (PEPCID) 20 MG tablet     Endocrine   Diabetes mellitus ([place]) Well-controlledDM with A1c < 7.0% and no recent medical followup.  Pt has continued great lifestyle management strategies. Goal A1c < 7.0%. - Complications - hyperlipidemia and cataracts.  Plan:  1. Continue current therapy: metformin 1000 mg bid 2. Encourage improved lifestyle: - low carb/low glycemic diet encouraged - Continue regular physical activity to 30 minutes most days of the week.  Explained that increased physical  activity increases body's use of sugar for energy.  Patient verbalizes that is why he already walks after meals. 3. Continue ACEi and Statin 5. DM Foot exam done today with normal findings.   and Advised to schedule DM ophtho exam, send record. 6. Follow-up 3 months.    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  Medications tolerated without side effects.  Continue at current doses.  Refills provided.  Encouraged ongoing work to improve sleep hygiene. Followup 6 months.    Relevant Medications   traZODone (DESYREL) 100 MG tablet   Hyperlipidemia Stable per patient in past.  Recheck labs.  Continue statin.  Refills provided. Follow-up 6 months.   Relevant Medications   atorvastatin (LIPITOR) 40 MG tablet   hydrochlorothiazide (MICROZIDE) 12.5 MG capsule   lisinopril (PRINIVIL,ZESTRIL) 40 MG tablet   Other Relevant Orders   COMPLETE METABOLIC PANEL WITH GFR (Completed)   Lipid panel (Completed)    Other Visit Diagnoses    Encounter to establish care     Previous PCP was at [facility].  Records will be requested.  Past medical, family, and surgical history reviewed w/ patient in clinic.       Meds ordered this encounter  Medications  . traZODone (DESYREL) 100 MG tablet    Sig: Take 1 tablet (100 mg total) by mouth at bedtime as needed for sleep.    Dispense:  90 tablet    Refill:  1    Order Specific Question:   Supervising Provider    Answer:   Olin Hauser [[id number]]  . albuterol (PROVENTIL) (2.5 MG/3ML) 0.083% nebulizer solution    Sig: Take 3 mLs (2.5 mg total) by nebulization every 6 (six) hours as needed for wheezing or shortness of breath.  Dispense:  75 mL    Refill:  1    Please do not fill until patient requests.    Order Specific Question:   Supervising Provider    Answer:   Olin Hauser [[id number]]  . atorvastatin (LIPITOR) 40 MG tablet    Sig: TAKE 1 TABLET BY MOUTH AT BEDTIME FOR HIGH CHOLESTEROL    Dispense:  90 tablet    Refill:  1    Order Specific Question:   Supervising Provider    Answer:   Olin Hauser [[id number]]  . famotidine (PEPCID) 20 MG tablet    Sig: Take 1 tablet (20 mg total) by mouth 2 (two) times daily.    Dispense:  180 tablet    Refill:  1    Order Specific Question:   Supervising Provider    Answer:   Olin Hauser [[id number]]  . hydrochlorothiazide (MICROZIDE) 12.5 MG capsule    Sig: TAKE ONE CAPSULE BY MOUTH ONCE DAILY FOR HIGH BLOOD PRESSURE    Dispense:  90 capsule    Refill:  1    Order Specific Question:   Supervising Provider    Answer:   Olin Hauser [[id number]]  . lisinopril (PRINIVIL,ZESTRIL) 40 MG tablet    Sig: Take 1 tablet (40 mg total) by mouth daily. for high blood pressure    Dispense:  90 tablet    Refill:  1    Order Specific Question:   Supervising Provider    Answer:   Olin Hauser [[id number]]  . metFORMIN (GLUCOPHAGE) 1000 MG tablet    Sig: TAKE 1 TABLET BY MOUTH TWICE DAILY FOR DIABETES    Dispense:  180 tablet    Refill:  1    Order Specific Question:   Supervising Provider    Answer:   Olin Hauser [[id number]]   Follow up plan: Return in about 3 months (around 10/27/2018) for diabetes, hypertension.  Cassell Smiles, DNP, AGPCNP-BC Adult Gerontology Primary Care Nurse Practitioner [facility] 07/27/2018, 3:47 PM

## 2018-07-28 ENCOUNTER — Telehealth: Payer: Self-pay | Admitting: *Deleted

## 2018-07-28 DIAGNOSIS — Z122 Encounter for screening for malignant neoplasm of respiratory organs: Secondary | ICD-10-CM

## 2018-07-28 DIAGNOSIS — Z87891 Personal history of nicotine dependence: Secondary | ICD-10-CM

## 2018-07-28 LAB — LIPID PANEL
Cholesterol: 77 mg/dL (ref <200)
HDL: 33 mg/dL — ABNORMAL LOW (ref >=40)
LDL Cholesterol (Calc): 26 mg/dL (calc)
Non-HDL Cholesterol (Calc): 44 mg/dL (calc) (ref <130)
Total CHOL/HDL Ratio: 2.3 (calc) (ref <5.0)
Triglycerides: 95 mg/dL (ref <150)

## 2018-07-28 LAB — COMPLETE METABOLIC PANEL WITH GFR
AG Ratio: 1.5 (calc) (ref 1.0–2.5)
ALT: 18 U/L (ref 9–46)
AST: 17 U/L (ref 10–35)
Albumin: 4 g/dL (ref 3.6–5.1)
Alkaline phosphatase (APISO): 64 U/L (ref 35–144)
BUN: 19 mg/dL (ref 7–25)
CO2: 26 mmol/L (ref 20–32)
Calcium: 9 mg/dL (ref 8.6–10.3)
Chloride: 105 mmol/L (ref 98–110)
Creat: 1.23 mg/dL (ref 0.70–1.25)
GFR, Est African American: 71 mL/min/{1.73_m2} (ref >=60)
GFR, Est Non African American: 62 mL/min/{1.73_m2} (ref >=60)
Globulin: 2.7 g/dL (calc) (ref 1.9–3.7)
Glucose, Bld: 113 mg/dL — ABNORMAL HIGH (ref 65–99)
Potassium: 4.2 mmol/L (ref 3.5–5.3)
Sodium: 139 mmol/L (ref 135–146)
Total Bilirubin: 0.5 mg/dL (ref 0.2–1.2)
Total Protein: 6.7 g/dL (ref 6.1–8.1)

## 2018-07-28 NOTE — Telephone Encounter (Signed)
Patient has been notified that annual lung cancer screening low dose CT scan is due currently or will be in near future. Confirmed that patient is within the age range of 55-77, and asymptomatic, (no signs or symptoms of lung cancer). Patient denies illness that would prevent curative treatment for lung cancer if found. Verified smoking history, (former, quit 03/26/18, 50 pack year). The shared decision making visit was done 06/01/15. Patient is agreeable for CT scan being scheduled.  

## 2018-07-29 ENCOUNTER — Encounter: Payer: Self-pay | Admitting: *Deleted

## 2018-07-30 ENCOUNTER — Encounter: Payer: Self-pay | Admitting: *Deleted

## 2018-08-02 ENCOUNTER — Ambulatory Visit: Admission: RE | Admit: 2018-08-02 | Payer: Medicare Other | Source: Ambulatory Visit

## 2018-08-06 ENCOUNTER — Telehealth: Payer: Self-pay | Admitting: *Deleted

## 2018-08-06 NOTE — Telephone Encounter (Signed)
Attempted to contact patient r/t LDCT Screening follow up due at this time. Pt did not realize he had an appt and was a no show, would like to reschedule appt for the end of this month, sent to scheduling at this time.

## 2018-08-09 ENCOUNTER — Telehealth: Payer: Self-pay | Admitting: *Deleted

## 2018-08-09 NOTE — Telephone Encounter (Signed)
Called pt to inform of ldct appt for tuesday 08/24/2018 @ 2:00pm here @ [facility], voiced understanding appt mailed.

## 2018-08-12 ENCOUNTER — Telehealth: Payer: Self-pay | Admitting: *Deleted

## 2018-08-12 NOTE — Telephone Encounter (Signed)
Patient notified/message left to notify patient that due to current restrictions lung screening appointments are cancelled and patient will be contacted regarding rescheduling.  

## 2018-08-24 ENCOUNTER — Ambulatory Visit: Payer: Medicare Other

## 2018-10-05 ENCOUNTER — Telehealth: Payer: Self-pay | Admitting: *Deleted

## 2018-10-05 NOTE — Telephone Encounter (Signed)
Patient has been notified that annual lung cancer screening low dose CT scan is due currently or will be in near future. Confirmed that patient is within the age range of 55-77, and asymptomatic, (no signs or symptoms of lung cancer). Patient denies illness that would prevent curative treatment for lung cancer if found. Verified smoking history, (former, quit 03/26/18, 50 pack year). The shared decision making visit was done 06/01/15. Patient is agreeable for CT scan being scheduled.

## 2018-10-11 ENCOUNTER — Ambulatory Visit
Admission: RE | Admit: 2018-10-11 | Discharge: 2018-10-11 | Disposition: A | Discharge status: Home / Self Care | Payer: Medicare Other | Source: Ambulatory Visit | Attending: Oncology | Admitting: Oncology

## 2018-10-11 ENCOUNTER — Other Ambulatory Visit: Payer: Self-pay

## 2018-10-11 DIAGNOSIS — Z122 Encounter for screening for malignant neoplasm of respiratory organs: Secondary | ICD-10-CM | POA: Insufficient documentation

## 2018-10-11 DIAGNOSIS — Z87891 Personal history of nicotine dependence: Secondary | ICD-10-CM | POA: Insufficient documentation

## 2018-10-12 ENCOUNTER — Encounter: Payer: Self-pay | Admitting: *Deleted

## 2018-11-08 ENCOUNTER — Other Ambulatory Visit: Payer: Self-pay

## 2018-11-08 ENCOUNTER — Encounter: Payer: Self-pay | Admitting: Family Medicine

## 2018-11-08 ENCOUNTER — Ambulatory Visit (INDEPENDENT_AMBULATORY_CARE_PROVIDER_SITE_OTHER): Payer: Medicare Other | Admitting: Family Medicine

## 2018-11-08 ENCOUNTER — Ambulatory Visit: Payer: Medicare Other | Admitting: Nurse Practitioner

## 2018-11-08 DIAGNOSIS — E1169 Type 2 diabetes mellitus with other specified complication: Secondary | ICD-10-CM

## 2018-11-08 DIAGNOSIS — I10 Essential (primary) hypertension: Secondary | ICD-10-CM | POA: Diagnosis not present

## 2018-11-08 NOTE — Patient Instructions (Signed)
No mychart access. AVS info given by phone.

## 2018-11-08 NOTE — Progress Notes (Signed)
Virtual Visit via Telephone The purpose of this virtual visit is to provide medical care while limiting exposure to the novel coronavirus (COVID19) for both patient and office staff.  Consent was obtained for phone visit:  Yes.   Answered questions that patient had about telehealth interaction:  Yes.   I discussed the limitations, risks, security and privacy concerns of performing an evaluation and management service by telephone. I also discussed with the patient that there may be a patient responsible charge related to this service. The patient expressed understanding and agreed to proceed.  Patient Location: Home Provider Location: [name] (Office)  PCP is Cassell Smiles, AGPCNP-BC - I am currently covering during her maternity leave.   ---------------------------------------------------------------------- Chief Complaint  Patient presents with  . Diabetes    S: Reviewed CMA documentation. I have called patient and gathered additional HPI as follows:  CHRONIC DM, Type 2: Reports no concerns, last check A1c 5.8 CBGs: Avg 120s, Low >100, High <200. Checks CBGs x 1 daily Meds: Metformin 1000mg  BID Reports good compliance. Tolerating well w/o side-effects Currently on ACEi / Statin Lifestyle: - Diet (balanced low sugar, reduced carbs/starches)  - Exercise (walking twice a day) - Unable to schedule DM Eye visit with eye doctor locally, he will try to schedule soon, now that restrictions from coronavirus pandemic are less Denies hypoglycemia, polyuria, visual changes, numbness or tingling.  CHRONIC HTN: Reports not checking BP regularly. Current Meds - Lisinopril 40mg  daily, HCTZ 12.5mg  daily   Reports good compliance, took meds today. Tolerating well, w/o complaints. Denies CP, dyspnea, HA, edema, dizziness / lightheadedness   Denies any high risk travel to areas of current concern for COVID19. Denies any known or suspected exposure to person with or possibly  with COVID19.  Denies any fevers, chills, sweats, body ache, cough, shortness of breath, sinus pain or pressure, headache, abdominal pain, diarrhea  Past Medical History:  Diagnosis Date  . Anxiety   . COPD (chronic obstructive pulmonary disease) ([place])   . GERD (gastroesophageal reflux disease)   . Hypertension   . Insomnia   . Personal history of tobacco use, presenting hazards to health 05/31/2015   Social History   Tobacco Use  . Smoking status: Current Every Day Smoker    Packs/day: 1.00    Years: 40.00    Pack years: 40.00    Types: Cigarettes    Last attempt to quit: 01/26/2018    Years since quitting: 0.7  . Smokeless tobacco: Current User  Substance Use Topics  . Alcohol use: Not Currently    Comment: quit 2003, drank about 5 beer per week prior  . Drug use: Never    Current Outpatient Medications:  .  atorvastatin (LIPITOR) 40 MG tablet, TAKE 1 TABLET BY MOUTH AT BEDTIME FOR HIGH CHOLESTEROL, Disp: 90 tablet, Rfl: 1 .  famotidine (PEPCID) 20 MG tablet, Take 1 tablet (20 mg total) by mouth 2 (two) times daily., Disp: 180 tablet, Rfl: 1 .  hydrochlorothiazide (MICROZIDE) 12.5 MG capsule, TAKE ONE CAPSULE BY MOUTH ONCE DAILY FOR HIGH BLOOD PRESSURE, Disp: 90 capsule, Rfl: 1 .  lisinopril (PRINIVIL,ZESTRIL) 40 MG tablet, Take 1 tablet (40 mg total) by mouth daily. for high blood pressure, Disp: 90 tablet, Rfl: 1 .  metFORMIN (GLUCOPHAGE) 1000 MG tablet, TAKE 1 TABLET BY MOUTH TWICE DAILY FOR DIABETES, Disp: 180 tablet, Rfl: 1 .  traZODone (DESYREL) 100 MG tablet, Take 1 tablet (100 mg total) by mouth at bedtime as needed for sleep.,  Disp: 90 tablet, Rfl: 1 .  albuterol (PROVENTIL) (2.5 MG/3ML) 0.083% nebulizer solution, Take 3 mLs (2.5 mg total) by nebulization every 6 (six) hours as needed for wheezing or shortness of breath. (Patient not taking: Reported on 11/08/2018), Disp: 75 mL, Rfl: 1  Depression screen [facility] 2/9 11/08/2018 07/27/2018  Decreased Interest 1 0  Down,  Depressed, Hopeless 1 0  PHQ - 2 Score 2 0  Altered sleeping 0 -  Tired, decreased energy 2 -  Change in appetite 1 -  Feeling bad or failure about yourself  0 -  Trouble concentrating 0 -  Moving slowly or fidgety/restless 2 -  Suicidal thoughts 0 -  PHQ-9 Score 7 -  Difficult doing work/chores Not difficult at all -    GAD 7 : Generalized Anxiety Score 07/27/2018  Nervous, Anxious, on Edge 0  Control/stop worrying 0  Worry too much - different things 0  Trouble relaxing 0  Restless 0  Easily annoyed or irritable 0  Afraid - awful might happen 0  Total GAD 7 Score 0  Anxiety Difficulty Not difficult at all    -------------------------------------------------------------------------- O: No physical exam performed due to remote telephone encounter.  Lab results reviewed.  Recent Labs    07/27/18 1635  HGBA1C 5.8*     No results found for this or any previous visit (from the past 2160 hour(s)).  -------------------------------------------------------------------------- A&P:  Problem List Items Addressed This Visit    Essential hypertension    Reported controlled HTN - Home BP readings limited  No known complications     Plan:  1. Continue current BP regimen - Lisinopril 40mg  daily, HCTZ 12.5mg  daily 2. Encourage improved lifestyle - low sodium diet, regular exercise 3. Start monitor BP outside office, bring readings to next visit, if persistently >140/90 or new symptoms notify office sooner      Type 2 diabetes mellitus with other specified complication ([place]) - Primary    Previously very well-controlled DM with A1c last 5.8 No hypoglycemia or hyperglycemia Complications - Elevated creatinine, cataracts, GERD, Hyperlipidemia - increases risk of future cardiovascular complications   Plan:  1. Continue current therapy - Metformin 1000mg  BID 2. Encourage improved lifestyle - low carb, low sugar diet, reduce portion size, continue improving regular exercise 3.  Check CBG , bring log to next visit for review 4. Continue ACEi, Statin 5. Advised to schedule DM ophtho exam, send record         No orders of the defined types were placed in this encounter.   Follow-up: - Return in 3 months for DM A1c w/ PCP  Patient verbalizes understanding with the above medical recommendations including the limitation of remote medical advice.  Specific follow-up and call-back criteria were given for patient to follow-up or seek medical care more urgently if needed.   - Time spent in direct consultation with patient on phone: 11 minutes  [name], [facility] 11/08/2018, 8:38 AM

## 2018-11-08 NOTE — Assessment & Plan Note (Addendum)
Reported controlled HTN - Home BP readings limited  No known complications     Plan:  1. Continue current BP regimen - Lisinopril 40mg  daily, HCTZ 12.5mg  daily 2. Encourage improved lifestyle - low sodium diet, regular exercise 3. Start monitor BP outside office, bring readings to next visit, if persistently >140/90 or new symptoms notify office sooner

## 2018-11-08 NOTE — Assessment & Plan Note (Signed)
Previously very well-controlled DM with A1c last 5.8 No hypoglycemia or hyperglycemia Complications - Elevated creatinine, cataracts, GERD, Hyperlipidemia - increases risk of future cardiovascular complications   Plan:  1. Continue current therapy - Metformin 1000mg  BID 2. Encourage improved lifestyle - low carb, low sugar diet, reduce portion size, continue improving regular exercise 3. Check CBG , bring log to next visit for review 4. Continue ACEi, Statin 5. Advised to schedule DM ophtho exam, send record

## 2018-11-12 ENCOUNTER — Other Ambulatory Visit: Payer: Self-pay

## 2018-11-12 DIAGNOSIS — I10 Essential (primary) hypertension: Secondary | ICD-10-CM

## 2018-11-12 DIAGNOSIS — E1136 Type 2 diabetes mellitus with diabetic cataract: Secondary | ICD-10-CM

## 2018-11-12 DIAGNOSIS — E1169 Type 2 diabetes mellitus with other specified complication: Secondary | ICD-10-CM

## 2018-11-12 DIAGNOSIS — E785 Hyperlipidemia, unspecified: Secondary | ICD-10-CM

## 2018-11-12 DIAGNOSIS — K219 Gastro-esophageal reflux disease without esophagitis: Secondary | ICD-10-CM

## 2018-11-12 MED ORDER — HYDROCHLOROTHIAZIDE 12.5 MG PO CAPS: ORAL_CAPSULE | ORAL | 1 refills | Status: DC

## 2018-11-12 MED ORDER — METFORMIN HCL 1000 MG PO TABS: ORAL_TABLET | ORAL | 1 refills | Status: DC

## 2018-11-12 MED ORDER — LISINOPRIL 40 MG PO TABS: 40.0000 mg | ORAL_TABLET | Freq: Every day | ORAL | 1 refills | Status: DC

## 2018-11-12 MED ORDER — FAMOTIDINE 20 MG PO TABS: 20.0000 mg | ORAL_TABLET | Freq: Two times a day (BID) | ORAL | 1 refills | Status: DC

## 2018-11-12 MED ORDER — ATORVASTATIN CALCIUM 40 MG PO TABS: ORAL_TABLET | ORAL | 1 refills | Status: DC

## 2018-11-16 ENCOUNTER — Telehealth: Payer: Self-pay

## 2018-11-16 NOTE — Telephone Encounter (Signed)
The pt called requesting a letter for a service animal (dog) to help with his anxiety. The letter will be submitted to the patients apartment manager. He informed me that his previous PCP at [facility] wrote him a letter for a service cat several years ago.

## 2018-11-16 NOTE — Telephone Encounter (Signed)
Covering inbox for [name], AGPCNP-BC while she is out of office.  I did see patient for virtual visit recently. We did not focus on anxiety.  I agreed to write a generic letter for this request. It is printed and signed, available for pick up or to be mailed to patient, it is in my outbox.  If he needs any further documentation, I would ask that he follow-up with PCP Lauren on her return and discuss his anxiety/mental health in more detail.  Nobie Putnam, [facility] 11/16/2018, 1:14 PM

## 2018-11-16 NOTE — Telephone Encounter (Signed)
The pt was notified. I will mail out the letter per patient request.

## 2018-12-08 LAB — HM DIABETES EYE EXAM

## 2019-01-26 ENCOUNTER — Other Ambulatory Visit: Payer: Self-pay | Admitting: Nurse Practitioner

## 2019-01-26 DIAGNOSIS — K219 Gastro-esophageal reflux disease without esophagitis: Secondary | ICD-10-CM

## 2019-01-26 MED ORDER — OMEPRAZOLE 20 MG PO CPDR: 20.0000 mg | DELAYED_RELEASE_CAPSULE | Freq: Every day | ORAL | 0 refills | Status: DC

## 2019-02-10 ENCOUNTER — Ambulatory Visit (INDEPENDENT_AMBULATORY_CARE_PROVIDER_SITE_OTHER): Payer: Medicare Other | Admitting: Nurse Practitioner

## 2019-02-10 ENCOUNTER — Other Ambulatory Visit: Payer: Self-pay

## 2019-02-10 ENCOUNTER — Encounter: Payer: Self-pay | Admitting: Nurse Practitioner

## 2019-02-10 VITALS — BP 95/59 | HR 68 | Ht 66.0 in | Wt 159.2 lb

## 2019-02-10 DIAGNOSIS — I10 Essential (primary) hypertension: Secondary | ICD-10-CM | POA: Diagnosis not present

## 2019-02-10 DIAGNOSIS — F5101 Primary insomnia: Secondary | ICD-10-CM

## 2019-02-10 DIAGNOSIS — K219 Gastro-esophageal reflux disease without esophagitis: Secondary | ICD-10-CM

## 2019-02-10 DIAGNOSIS — Z23 Encounter for immunization: Secondary | ICD-10-CM | POA: Diagnosis not present

## 2019-02-10 DIAGNOSIS — E1169 Type 2 diabetes mellitus with other specified complication: Secondary | ICD-10-CM | POA: Diagnosis not present

## 2019-02-10 DIAGNOSIS — E785 Hyperlipidemia, unspecified: Secondary | ICD-10-CM

## 2019-02-10 LAB — POCT GLYCOSYLATED HEMOGLOBIN (HGB A1C): Hemoglobin A1C: 6.3 % — AB (ref 4.0–5.6)

## 2019-02-10 MED ORDER — LISINOPRIL 20 MG PO TABS: 20.0000 mg | ORAL_TABLET | Freq: Every day | ORAL | 1 refills | Status: DC

## 2019-02-10 MED ORDER — OMEPRAZOLE 20 MG PO CPDR: 20.0000 mg | DELAYED_RELEASE_CAPSULE | Freq: Every day | ORAL | 1 refills | Status: DC

## 2019-02-10 MED ORDER — ATORVASTATIN CALCIUM 40 MG PO TABS: ORAL_TABLET | ORAL | 1 refills | Status: DC

## 2019-02-10 MED ORDER — TRAZODONE HCL 100 MG PO TABS: 50.0000 mg | ORAL_TABLET | Freq: Every evening | ORAL | 1 refills | Status: DC | PRN

## 2019-02-10 MED ORDER — HYDROCHLOROTHIAZIDE 12.5 MG PO CAPS: ORAL_CAPSULE | ORAL | 1 refills | Status: DC

## 2019-02-10 NOTE — Progress Notes (Signed)
Subjective:    Patient ID: [name], male    DOB: [birth year], 63 y.o.   MRN: [id number]  [name] is a 63 y.o. male presenting on 02/10/2019 for Diabetes  HPI Diabetes Pt presents today for follow up of Type 2 diabetes mellitus. He is not checking CBG at home - Current diabetic medications include: metformin - He is not currently symptomatic.  - He denies polydipsia, polyphagia, polyuria, headaches, diaphoresis, shakiness, chills, pain, numbness or tingling in extremities and changes in vision.   - Clinical course has been stable. - He  reports an exercise routine that includes walking 2 miles, 3-5 days per week. - His diet is moderate in salt, moderate in fat, and moderate in carbohydrates. - Weight trend: stable  PREVENTION: Eye exam current (within one year): yes Foot exam current (within one year): yes  Lipid/ASCVD risk reduction - on statin: yes Kidney protection - on ace or arb: yes Recent Labs    07/27/18 1635 02/10/19 1015  HGBA1C 5.8* 6.3*     Hypertension - He is not checking BP at home or outside of clinic.    - Current medications: lisinopril 40 mg once daily, hydrochlorothiazide 12.5 mg once daily, tolerating well without side effects - He is not currently symptomatic. - Pt denies headache, lightheadedness, dizziness, changes in vision, chest tightness/pressure, palpitations, leg swelling, sudden loss of speech or loss of consciousness.  Insomnia Patient is having difficulty sleeping at night.  Goes to bed around 10:30, but is "instantly wide awake."  Patient further explains that he only remains asleep for about 30-60 minutes.  Watches TV at night regularly. Patient took trazodone previously for sleep. Stopped this in past, but doesn't really remember why.  GERD Patient is not having any heartburn when taking medication.  Has trialed off med without success.  Is having difficulty eating when not taking meds. - He reports no n/v, coffee  ground emesis, dark/black/tarry stool, BRBPR, or other GI bleeding.   Social History   Tobacco Use  . Smoking status: Former Smoker    Packs/day: 1.00    Years: 40.00    Pack years: 40.00    Types: Cigarettes    Quit date: 07/25/2018    Years since quitting: 0.5  . Smokeless tobacco: Former Network engineer Use Topics  . Alcohol use: Not Currently    Comment: quit 2003, drank about 5 beer per week prior  . Drug use: Never    Review of Systems Per HPI unless specifically indicated above     Objective:    BP (!) 95/59 (BP Location: Right Arm, Patient Position: Sitting, Cuff Size: Normal)   Pulse 68   Ht 5\' 6"  (1.676 m)   Wt 159 lb 3.2 oz (72.2 kg)   BMI 25.70 kg/m   Wt Readings from Last 3 Encounters:  02/10/19 159 lb 3.2 oz (72.2 kg)  10/11/18 155 lb (70.3 kg)  07/27/18 155 lb (70.3 kg)    Physical Exam Vitals signs reviewed.  Constitutional:      General: He is not in acute distress.    Appearance: He is well-developed.  HENT:     Head: Normocephalic and atraumatic.  Cardiovascular:     Rate and Rhythm: Normal rate and regular rhythm.     Pulses:          Radial pulses are 2+ on the right side and 2+ on the left side.       Posterior tibial pulses are 1+  on the right side and 1+ on the left side.     Heart sounds: Normal heart sounds, S1 normal and S2 normal.  Pulmonary:     Effort: Pulmonary effort is normal. No respiratory distress.     Breath sounds: Normal breath sounds and air entry.  Abdominal:     General: Bowel sounds are normal. There is no distension.     Palpations: Abdomen is soft.     Tenderness: There is no abdominal tenderness.     Hernia: No hernia is present.  Musculoskeletal:     Right lower leg: No edema.     Left lower leg: No edema.  Skin:    General: Skin is warm and dry.     Capillary Refill: Capillary refill takes less than 2 seconds.  Neurological:     Mental Status: He is alert and oriented to person, place, and time.   Psychiatric:        Attention and Perception: Attention normal.        Mood and Affect: Mood and affect normal.        Behavior: Behavior normal. Behavior is cooperative.      Results for orders placed or performed in visit on 02/10/19  POCT glycosylated hemoglobin (Hb A1C)  Result Value Ref Range   Hemoglobin A1C 6.3 (A) 4.0 - 5.6 %   HbA1c POC (<> result, manual entry)     HbA1c, POC (prediabetic range)     HbA1c, POC (controlled diabetic range)        Assessment & Plan:   Problem List Items Addressed This Visit      Cardiovascular and Mediastinum   Essential hypertension Controlled hypertension.  BP goal < 130/80.  Pt is working on lifestyle modifications.  Taking medications tolerating well without side effects. Complications: hypotension  Plan: 1. Reduce lisinopril to 20 mg once daily (from 40) - Continue hydrochlorothiazide.  Consider stopping in future if BP remains at goals and needing to reduce meds again. 2. Obtain labs  3. Encouraged heart healthy diet and increasing exercise to 30 minutes most days of the week. 4. Check BP 1-2 x per week at home, keep log, and bring to clinic at next appointment. 5. Follow up 3 months.     Relevant Medications   lisinopril (ZESTRIL) 20 MG tablet   atorvastatin (LIPITOR) 40 MG tablet   hydrochlorothiazide (MICROZIDE) 12.5 MG capsule     Digestive   Gastroesophageal reflux disease Currently well controlled on omeprazole 20 mg once daily.  Plan: 1. Continue omeprazole 20 mg once daily. Side effects discussed. Pt wants to continue med. 2. Avoid diet triggers. Reviewed need to seek care if globus sensation, difficulty swallowing, s/sx of GI bleed. 3. Follow up as needed and in 6 months.    Relevant Medications   omeprazole (PRILOSEC) 20 MG capsule     Endocrine   Type 2 diabetes mellitus with other specified complication ([place]) - Primary Well-controlledDM with A1c 6.3% increased from last check and goal A1c < 7.0%. -  Complications - hyperlipidemia, hyperglycemia.  Plan:  1. Continue current therapy: metformin 2. Encourage improved lifestyle: - low carb/low glycemic diet reinforced prior education - Increase physical activity to 30 minutes most days of the week.  Explained that increased physical activity increases body's use of sugar for energy. 3. Check fasting am CBG and bring log to next visit for review 4. Continue ASA, ACEi and Statin 5. Advised to schedule DM ophtho exam, send record when due 6.  Follow-up 3 months    Relevant Medications   lisinopril (ZESTRIL) 20 MG tablet   atorvastatin (LIPITOR) 40 MG tablet   Other Relevant Orders   POCT glycosylated hemoglobin (Hb A1C) (Completed)   Comprehensive metabolic panel (Completed)     Other   Primary insomnia Uncontrolled and worsening.  Patient with past response to trazodone.  Plan: 1. Resume trazodone 50 mg once daily x 1-2 weeks, increase to 100 mg if needed for effect.   2. Encouraged good sleep hygiene. 3. Follow-up 3 months or sooner if trazodone not effective.   Relevant Medications   traZODone (DESYREL) 100 MG tablet   Hyperlipidemia Stable today on exam.  Medications tolerated without side effects.  Continue at current doses.  Refills provided.  Check labs today. Followup prn after labs and in 6 months.    Relevant Medications   lisinopril (ZESTRIL) 20 MG tablet   atorvastatin (LIPITOR) 40 MG tablet   hydrochlorothiazide (MICROZIDE) 12.5 MG capsule    Other Visit Diagnoses    Needs flu shot       Relevant Orders   Flu Vaccine QUAD High Dose(Fluad) (Completed)      Meds ordered this encounter  Medications  . traZODone (DESYREL) 100 MG tablet    Sig: Take 0.5-1 tablets (50-100 mg total) by mouth at bedtime as needed for sleep.    Dispense:  90 tablet    Refill:  1    Order Specific Question:   Supervising Provider    Answer:   Olin Hauser [[id number]]  . lisinopril (ZESTRIL) 20 MG tablet    Sig: Take 1 tablet  (20 mg total) by mouth daily. for high blood pressure    Dispense:  90 tablet    Refill:  1    Order Specific Question:   Supervising Provider    Answer:   Olin Hauser [[id number]]  . atorvastatin (LIPITOR) 40 MG tablet    Sig: TAKE 1 TABLET BY MOUTH AT BEDTIME FOR HIGH CHOLESTEROL    Dispense:  90 tablet    Refill:  1    Order Specific Question:   Supervising Provider    Answer:   Olin Hauser [[id number]]  . hydrochlorothiazide (MICROZIDE) 12.5 MG capsule    Sig: TAKE ONE CAPSULE BY MOUTH ONCE DAILY FOR HIGH BLOOD PRESSURE    Dispense:  90 capsule    Refill:  1    Order Specific Question:   Supervising Provider    Answer:   Olin Hauser [[id number]]  . omeprazole (PRILOSEC) 20 MG capsule    Sig: Take 1 capsule (20 mg total) by mouth daily.    Dispense:  90 capsule    Refill:  1    Order Specific Question:   Supervising Provider    Answer:   Olin Hauser [[id number]]    Follow up plan: Return in about 3 months (around 05/12/2019) for hypertension, diabetes.  Cassell Smiles, DNP, AGPCNP-BC Adult Gerontology Primary Care Nurse Practitioner [facility] 02/10/2019, 10:17 AM

## 2019-02-10 NOTE — Patient Instructions (Addendum)
[name],   Thank you for coming in to clinic today.  1. REDUCE lisinopril to 20 mg once daily (take 1/2 of 40 mg tablet before your next refill). - New prescription is to take one whole 20 mg tablet daily.  2. Trazodone - resume 100 mg tablet.  Try 1/2 tablet at bedtime again for 50 mg for first 1-2 weeks.  If not effective, take whole 100 mg tablet.  Please schedule a follow-up appointment with Cassell Smiles, AGNP. Return in about 3 months (around 05/12/2019) for hypertension, diabetes.  If you have any other questions or concerns, please feel free to call the clinic or send a message through [place]. You may also schedule an earlier appointment if necessary.  You will receive a survey after today's visit either digitally by e-mail or paper by [employer]. Your experiences and feedback matter to [place].  Please respond so we know how we are doing as we provide care for you.   Cassell Smiles, DNP, AGNP-BC Adult Gerontology Nurse Practitioner [place]

## 2019-02-11 LAB — COMPREHENSIVE METABOLIC PANEL
AG Ratio: 1.4 (calc) (ref 1.0–2.5)
ALT: 45 U/L (ref 9–46)
AST: 31 U/L (ref 10–35)
Albumin: 4.3 g/dL (ref 3.6–5.1)
Alkaline phosphatase (APISO): 61 U/L (ref 35–144)
BUN: 22 mg/dL (ref 7–25)
CO2: 24 mmol/L (ref 20–32)
Calcium: 9.7 mg/dL (ref 8.6–10.3)
Chloride: 104 mmol/L (ref 98–110)
Creat: 1.25 mg/dL (ref 0.70–1.25)
Globulin: 3 g/dL (calc) (ref 1.9–3.7)
Glucose, Bld: 204 mg/dL — ABNORMAL HIGH (ref 65–99)
Potassium: 4.5 mmol/L (ref 3.5–5.3)
Sodium: 136 mmol/L (ref 135–146)
Total Bilirubin: 0.8 mg/dL (ref 0.2–1.2)
Total Protein: 7.3 g/dL (ref 6.1–8.1)

## 2019-04-29 ENCOUNTER — Other Ambulatory Visit: Payer: Self-pay | Admitting: Nurse Practitioner

## 2019-04-29 DIAGNOSIS — E1136 Type 2 diabetes mellitus with diabetic cataract: Secondary | ICD-10-CM

## 2019-04-29 DIAGNOSIS — I10 Essential (primary) hypertension: Secondary | ICD-10-CM

## 2019-04-29 DIAGNOSIS — E1169 Type 2 diabetes mellitus with other specified complication: Secondary | ICD-10-CM

## 2019-06-07 ENCOUNTER — Ambulatory Visit: Payer: Medicare Other

## 2019-07-18 ENCOUNTER — Ambulatory Visit: Payer: Medicare Other | Admitting: Family Medicine

## 2019-07-22 ENCOUNTER — Ambulatory Visit: Payer: Medicare Other | Attending: Internal Medicine

## 2019-07-22 DIAGNOSIS — Z23 Encounter for immunization: Secondary | ICD-10-CM

## 2019-07-22 NOTE — Progress Notes (Signed)
   Covid-19 Vaccination Clinic  Name:  [name]    MRN: [id number] DOB: November 25, 1955  07/22/2019  [name] was observed post Covid-19 immunization for 15 minutes without incidence. He was provided with Vaccine Information Sheet and instruction to access the V-Safe system.   [name] was instructed to call 911 with any severe reactions post vaccine: [name] Kitchen Difficulty breathing  . Swelling of your face and throat  . A fast heartbeat  . A bad rash all over your body  . Dizziness and weakness    Immunizations Administered    Name Date Dose VIS Date Route   Pfizer COVID-19 Vaccine 07/22/2019 12:50 PM 0.3 mL 05/06/2019 Intramuscular   Manufacturer: [place]   Lot: [id number]   [place]: [id number]

## 2019-08-01 ENCOUNTER — Other Ambulatory Visit: Payer: Self-pay

## 2019-08-01 ENCOUNTER — Encounter: Payer: Self-pay | Admitting: Family Medicine

## 2019-08-01 ENCOUNTER — Ambulatory Visit (INDEPENDENT_AMBULATORY_CARE_PROVIDER_SITE_OTHER): Payer: Medicare Other | Admitting: Family Medicine

## 2019-08-01 ENCOUNTER — Other Ambulatory Visit: Payer: Self-pay | Admitting: Family Medicine

## 2019-08-01 VITALS — BP 116/74 | HR 100 | Temp 97.1°F | Resp 20 | Ht 66.0 in | Wt 160.6 lb

## 2019-08-01 DIAGNOSIS — E1136 Type 2 diabetes mellitus with diabetic cataract: Secondary | ICD-10-CM

## 2019-08-01 DIAGNOSIS — K219 Gastro-esophageal reflux disease without esophagitis: Secondary | ICD-10-CM

## 2019-08-01 DIAGNOSIS — Z136 Encounter for screening for cardiovascular disorders: Secondary | ICD-10-CM

## 2019-08-01 DIAGNOSIS — E1169 Type 2 diabetes mellitus with other specified complication: Secondary | ICD-10-CM

## 2019-08-01 DIAGNOSIS — E785 Hyperlipidemia, unspecified: Secondary | ICD-10-CM

## 2019-08-01 DIAGNOSIS — J449 Chronic obstructive pulmonary disease, unspecified: Secondary | ICD-10-CM

## 2019-08-01 DIAGNOSIS — I10 Essential (primary) hypertension: Secondary | ICD-10-CM

## 2019-08-01 MED ORDER — METFORMIN HCL 1000 MG PO TABS: ORAL_TABLET | ORAL | 1 refills | Status: DC

## 2019-08-01 MED ORDER — ATORVASTATIN CALCIUM 40 MG PO TABS: ORAL_TABLET | ORAL | 1 refills | Status: DC

## 2019-08-01 MED ORDER — HYDROCHLOROTHIAZIDE 12.5 MG PO CAPS: ORAL_CAPSULE | ORAL | 1 refills | Status: DC

## 2019-08-01 MED ORDER — FAMOTIDINE 20 MG PO TABS: 20.0000 mg | ORAL_TABLET | Freq: Two times a day (BID) | ORAL | 1 refills | Status: DC

## 2019-08-01 MED ORDER — ALBUTEROL SULFATE (2.5 MG/3ML) 0.083% IN NEBU: 2.5000 mg | INHALATION_SOLUTION | Freq: Four times a day (QID) | RESPIRATORY_TRACT | 1 refills | Status: DC | PRN

## 2019-08-01 MED ORDER — OMEPRAZOLE 20 MG PO CPDR: 20.0000 mg | DELAYED_RELEASE_CAPSULE | Freq: Every day | ORAL | 1 refills | Status: DC

## 2019-08-01 MED ORDER — LISINOPRIL 20 MG PO TABS: 20.0000 mg | ORAL_TABLET | Freq: Every day | ORAL | 1 refills | Status: DC

## 2019-08-01 NOTE — Patient Instructions (Signed)
I have put in a referral for a AAA ultrasound as we discussed.  We will see you back in 3 months for a DM and HTN follow up  You will receive a survey after today's visit either digitally by e-mail or paper by [employer]. Your experiences and feedback matter to [place].  Please respond so we know how we are doing as we provide care for you.  Call us with any questions/concerns/needs.  It is my goal to be available to you for your health concerns.  Thanks for choosing me to be a partner in your healthcare needs!  Harlin Rain, FNP-C Family Nurse Practitioner [facility] Phone: [phone]

## 2019-08-01 NOTE — Progress Notes (Signed)
Patient presents during Welcome to Medicare, notified myself that he had 1 day of medications left.  All refills sent in.

## 2019-08-01 NOTE — Progress Notes (Signed)
Subjective:    [name] is a 64 y.o. male who presents for Medicare Initial preventive examination.  Preventive Screening-Counseling & Management Problems Prior to Visit (verified) Patient Active Problem List   Diagnosis Date Noted  . Primary insomnia 07/27/2018  . Type 2 diabetes mellitus with other specified complication ([place]) [id number]  . Cataract associated with type 2 diabetes mellitus ([place]) 07/27/2018  . Gastroesophageal reflux disease 07/27/2018  . Essential hypertension 07/27/2018  . Centrilobular emphysema ([place]) 07/27/2018  . Hyperlipidemia 07/27/2018  . Personal history of tobacco use, presenting hazards to health 05/31/2015  . Chronic hepatitis C without hepatic coma ([place]) 06/08/2014   Medications Prior to Visit (verified) Current Outpatient Medications on File Prior to Visit  Medication Sig Dispense Refill  . albuterol (PROVENTIL) (2.5 MG/3ML) 0.083% nebulizer solution Take 3 mLs (2.5 mg total) by nebulization every 6 (six) hours as needed for wheezing or shortness of breath. 75 mL 1  . atorvastatin (LIPITOR) 40 MG tablet TAKE 1 TABLET BY MOUTH AT BEDTIME FOR HIGH CHOLESTEROL 90 tablet 1  . hydrochlorothiazide (MICROZIDE) 12.5 MG capsule TAKE ONE CAPSULE BY MOUTH ONCE DAILY FOR HIGH BLOOD PRESSURE 90 capsule 1  . lisinopril (ZESTRIL) 20 MG tablet TAKE 1 TABLET BY MOUTH ONCE DAILY FOR HIGH BLOOD PRESSURE 90 tablet 1  . metFORMIN (GLUCOPHAGE) 1000 MG tablet TAKE 1 TABLET BY MOUTH TWICE DAILY FOR DIABETES 180 tablet 1  . Multiple Vitamins-Minerals (SENTRY ADULT) TABS Take by mouth.    [name] Kitchen omeprazole (PRILOSEC) 20 MG capsule Take 1 capsule (20 mg total) by mouth daily. 90 capsule 1  . traZODone (DESYREL) 100 MG tablet Take 0.5-1 tablets (50-100 mg total) by mouth at bedtime as needed for sleep. 90 tablet 1  . Multiple Vitamins-Minerals (VITAMIN D3 COMPLETE PO) Take by mouth.     No current facility-administered medications on file prior to visit.   Allergies  (verified) Codeine  PAST HISTORY Family History Family History  Problem Relation Age of Onset  . Renal Disease Mother 52  . Heart disease Father   . Stroke Father   . Alzheimer's disease Father   . Stroke Brother   . Diabetes Brother   . Diabetes Maternal Grandfather    Social History Social History   Tobacco Use  . Smoking status: Former Smoker    Packs/day: 1.00    Years: 40.00    Pack years: 40.00    Types: Cigarettes    Quit date: 07/25/2018    Years since quitting: 1.0  . Smokeless tobacco: Former Network engineer Use Topics  . Alcohol use: Not Currently    Comment: quit 2003, drank about 5 beer per week prior    Are there smokers in your home (other than you)? No  Risk Factors Current exercise habits: Home exercise routine includes walking 1 hrs per day.  Dietary issues discussed: None  Hospitalizations in last 1 year: None  Cardiac risk factors: advanced age (older than 54 for men, 25 for women), diabetes mellitus, dyslipidemia, family history of premature cardiovascular disease, hypertension and male gender.  Depression Screen Depression screen [facility] 2/9 08/01/2019 02/10/2019 11/08/2018 07/27/2018  Decreased Interest 3 1 1  0  Down, Depressed, Hopeless 1 0 1 0  PHQ - 2 Score 4 1 2  0  Altered sleeping 2 3 0 -  Tired, decreased energy 3 3 2  -  Change in appetite 0 0 1 -  Feeling bad or failure about yourself  0 0 0 -  Trouble concentrating 3  1 0 -  Moving slowly or fidgety/restless 0 0 2 -  Suicidal thoughts 0 0 0 -  PHQ-9 Score 12 8 7  -  Difficult doing work/chores Somewhat difficult Not difficult at all Not difficult at all -     Activities of Daily Living In your present state of health, do you have any difficulty performing the following activities?:  Driving? No Managing money?  No Feeding yourself? No Getting from bed to chair? No Climbing a flight of stairs? No Preparing food and eating?: No Bathing or showering? No Getting dressed: No Getting to the  toilet? No Using the toilet:No Moving around from place to place: No In the past year have you fallen or had a near fall?:No   Are you sexually active?  Yes  Do you have more than one partner?  No  Hearing Difficulties: No Do you often ask people to speak up or repeat themselves? No Do you experience ringing or noises in your ears? No Do you have difficulty understanding soft or whispered voices? Yes   Do you feel that you have a problem with memory? Yes  Do you often misplace items? Yes  Do you feel safe at home?  Yes  Cognitive Testing  Alert? Yes  Normal Appearance?Yes  Oriented to person? Yes  Place? Yes   Time? Yes  Displays appropriate judgment?Yes  No flowsheet data found.  Advanced Directives  Advanced Directives have been discussed with the patient? Yes    List the Names of Other Physician/Practitioners you currently use: 1.  Eye Doctor - Dr. Clydene Laming  Medical Services received from non-[facility] providers in the past year:n/a  Immunization History  Administered Date(s) Administered  . Fluad Quad(high Dose 65+) 02/10/2019  . Influenza,inj,Quad PF,6+ Mos 02/15/2018  . PFIZER SARS-COV-2 Vaccination 07/22/2019  . Pneumococcal Polysaccharide-23 01/28/2011  . Tdap 10/08/2011    Screening Tests Health Maintenance  Topic Date Due  . HIV Screening  08/21/1968  . COLONOSCOPY  08/22/2003  . PNA vac Low Risk Adult (1 of 2 - PCV13) 08/22/2018  . FOOT EXAM  07/27/2019  . HEMOGLOBIN A1C  08/10/2019  . OPHTHALMOLOGY EXAM  12/08/2019  . TETANUS/TDAP  10/07/2021  . INFLUENZA VACCINE  Completed  . Hepatitis C Screening  Completed    History reviewed:  [name]  has a past medical history of Anxiety, COPD (chronic obstructive pulmonary disease) ([place]), GERD (gastroesophageal reflux disease), Hypertension, Insomnia, and Personal history of tobacco use, presenting hazards to health (05/31/2015). [name] does not have any pertinent problems on file. [name]  has a past surgical history that  includes Hernia repair (Left, 2003). His family history includes Alzheimer's disease in his father; Diabetes in his brother and maternal grandfather; Heart disease in his father; Renal Disease (age of onset: 98) in his mother; Stroke in his brother and father. [name]  reports that [name] quit smoking about a year ago. His smoking use included cigarettes. [name] has a 40.00 pack-year smoking history. [name] has quit using smokeless tobacco. [name] reports previous alcohol use. [name] reports that [name] does not use drugs. [name] has a current medication list which includes the following prescription(s): albuterol, atorvastatin, hydrochlorothiazide, lisinopril, metformin, sentry adult, omeprazole, trazodone, and multiple vitamins-minerals. Current Outpatient Medications on File Prior to Visit  Medication Sig Dispense Refill  . albuterol (PROVENTIL) (2.5 MG/3ML) 0.083% nebulizer solution Take 3 mLs (2.5 mg total) by nebulization every 6 (six) hours as needed for wheezing or shortness of breath. 75 mL 1  . atorvastatin (LIPITOR) 40  MG tablet TAKE 1 TABLET BY MOUTH AT BEDTIME FOR HIGH CHOLESTEROL 90 tablet 1  . hydrochlorothiazide (MICROZIDE) 12.5 MG capsule TAKE ONE CAPSULE BY MOUTH ONCE DAILY FOR HIGH BLOOD PRESSURE 90 capsule 1  . lisinopril (ZESTRIL) 20 MG tablet TAKE 1 TABLET BY MOUTH ONCE DAILY FOR HIGH BLOOD PRESSURE 90 tablet 1  . metFORMIN (GLUCOPHAGE) 1000 MG tablet TAKE 1 TABLET BY MOUTH TWICE DAILY FOR DIABETES 180 tablet 1  . Multiple Vitamins-Minerals (SENTRY ADULT) TABS Take by mouth.    [name] Kitchen omeprazole (PRILOSEC) 20 MG capsule Take 1 capsule (20 mg total) by mouth daily. 90 capsule 1  . traZODone (DESYREL) 100 MG tablet Take 0.5-1 tablets (50-100 mg total) by mouth at bedtime as needed for sleep. 90 tablet 1  . Multiple Vitamins-Minerals (VITAMIN D3 COMPLETE PO) Take by mouth.     No current facility-administered medications on file prior to visit.   [name] is allergic to codeine.  Review of Systems Review of Systems    Constitutional: Negative.   HENT: Negative.   Eyes: Negative.   Respiratory: Negative.   Cardiovascular: Negative.   Gastrointestinal: Negative.   Genitourinary: Negative.   Musculoskeletal: Negative.   Skin: Negative.   Neurological: Negative.   Endo/Heme/Allergies: Negative.   Psychiatric/Behavioral: Negative.      Objective:    BP 116/74 (BP Location: Right Arm, Patient Position: Sitting, Cuff Size: Normal)   Pulse 100   Temp (!) 97.1 F (36.2 C) (Temporal)   Resp 20   Ht 5\' 6"  (1.676 m)   Wt 160 lb 9.6 oz (72.8 kg)   SpO2 98%   BMI 25.92 kg/m   No exam data present  Filed Weights   08/01/19 1421  Weight: 160 lb 9.6 oz (72.8 kg)    Physical Exam Vitals reviewed.  Constitutional:      General: [name] is not in acute distress.    Appearance: Normal appearance. [name] is well-groomed and overweight. [name] is not ill-appearing or toxic-appearing.  Eyes:     General:        Right eye: No discharge.        Left eye: No discharge.     Extraocular Movements: Extraocular movements intact.     Conjunctiva/sclera: Conjunctivae normal.     Pupils: Pupils are equal, round, and reactive to light.  Pulmonary:     Effort: Pulmonary effort is normal.  Neurological:     General: No focal deficit present.     Mental Status: [name] is alert and oriented to person, place, and time.     Cranial Nerves: No cranial nerve deficit.     Sensory: No sensory deficit.     Motor: No weakness or tremor.     Coordination: Coordination normal.     Gait: Gait normal.  Psychiatric:        Attention and Perception: Attention and perception normal.        Mood and Affect: Mood and affect normal.        Speech: Speech normal.        Behavior: Behavior normal. Behavior is cooperative.        Thought Content: Thought content normal.        Cognition and Memory: Cognition and memory normal.        Judgment: Judgment normal.     08/01/19 EKG: WNL  HR 99 PR 168ms QT 344ms   QTc 370ms Axis deviation:      Assessment and Plan:    Problem List Items  Addressed This Visit    None      During the course of the visit the patient was educated and counseled about appropriate screening and preventive services including:    Pneumococcal vaccine   Influenza vaccine  Screening electrocardiogram  Diabetes screening  Glaucoma screening   AAA Ultrasound  Diet review for nutrition referral? N/A  Patient Instructions (the written plan) was given to the patient.  Medicare Attestation I have personally reviewed: The patient's medical and social history Their use of alcohol, tobacco or illicit drugs Their current medications and supplements The patient's functional ability including ADLs,fall risks, home safety risks, cognitive, and hearing and visual impairment Diet and physical activities Evidence for depression or mood disorders  The patient's weight, height, BMI, and visual acuity have been recorded in the chart.  I have made referrals, counseling, and provided education to the patient based on review of the above and I have provided the patient with a written personalized care plan for preventive services.     Harlin Rain, FNP-C Family Nurse Practitioner [facility] 08/01/2019, 2:33 PM

## 2019-08-22 ENCOUNTER — Ambulatory Visit (INDEPENDENT_AMBULATORY_CARE_PROVIDER_SITE_OTHER): Payer: Medicare Other

## 2019-08-22 ENCOUNTER — Other Ambulatory Visit: Payer: Self-pay

## 2019-08-22 DIAGNOSIS — Z136 Encounter for screening for cardiovascular disorders: Secondary | ICD-10-CM

## 2019-08-24 ENCOUNTER — Ambulatory Visit: Payer: Medicare Other | Attending: Internal Medicine

## 2019-08-24 DIAGNOSIS — Z23 Encounter for immunization: Secondary | ICD-10-CM

## 2019-08-24 NOTE — Progress Notes (Signed)
   Covid-19 Vaccination Clinic  Name:  [name]    MRN: [id number] DOB: 1956-02-24  08/24/2019  [name] was observed post Covid-19 immunization for 15 minutes without incident. He was provided with Vaccine Information Sheet and instruction to access the V-Safe system.   [name] was instructed to call 911 with any severe reactions post vaccine: [name] Kitchen Difficulty breathing  . Swelling of face and throat  . A fast heartbeat  . A bad rash all over body  . Dizziness and weakness   Immunizations Administered    Name Date Dose VIS Date Route   Pfizer COVID-19 Vaccine 08/24/2019 11:56 AM 0.3 mL 05/06/2019 Intramuscular   Manufacturer: [place]   Lot: [id number]   [place]: [id number]

## 2019-08-25 NOTE — Progress Notes (Signed)
Ultrasound is within normal limits.  No required follow up.

## 2019-08-30 ENCOUNTER — Encounter: Payer: Self-pay | Admitting: Family Medicine

## 2019-08-30 ENCOUNTER — Ambulatory Visit (INDEPENDENT_AMBULATORY_CARE_PROVIDER_SITE_OTHER): Payer: Medicare Other | Admitting: Family Medicine

## 2019-08-30 ENCOUNTER — Other Ambulatory Visit: Payer: Self-pay

## 2019-08-30 VITALS — BP 108/71 | HR 102 | Temp 97.3°F | Ht 66.0 in | Wt 160.0 lb

## 2019-08-30 DIAGNOSIS — E1169 Type 2 diabetes mellitus with other specified complication: Secondary | ICD-10-CM | POA: Diagnosis not present

## 2019-08-30 NOTE — Patient Instructions (Signed)
We have completed your diabetic foot exam today.  We will see you back in clinic in 4 weeks for your updated labs and A1C.  Advice on Protecting Your Feet   1. Daily foot inspections - Look for any breaks in the skin or areas of irritation such as blisters or red areas. Report to primary doctor or foot nurse immediately if any problems occur.  - If you have vision problems and cannot see your feet well or it is hard for you to reach your feet, ask a family member to inspect your feet daily.   2. Daily foot hygiene  - Wash feet daily, but do not soak your feet in hot water. If you do have callus, you may use warm water epsom salt foot soak and use moisturizer after. - Dry well especially between toes; Pat dry do not rub.  - If your skin is dry use a lotion to moisturize but never between the toes.  - If your skin is wet from perspiration, use an antifungal foot powder daily.   3. Shoes and Socks  - Wear a clean pair of socks daily.  - Make sure your shoes fit well and that you are measured and properly fit each time you purchase shoes. Your shoe size may change.  - Powder your shoes with a small amount of an antifungal foot powder daily, as the shoe is the only article of clothing that is not laundered.  - Wear appropriate shoes and socks for the weather. It is especially important to protect your feet from the cold; however, don't forget about sunscreen to the tops of your feet in the hot sun.  - Wear well fitting shoes rather than slippers or flip-flops when walking or standing for long period of time.  - When at home make sure you always have protective foot wear on your feet.     Eat at least 3 meals and 1-2 snacks per day (don't skip breakfast).  Aim for no more than 5 hours between eating. - Tip: If you go >5 hours without eating and become very hungry, your body will supply it's own resources temporarily and you can gain extra weight when you eat.   The 5 Minute Rule of Exercise -  Promise yourself to at least do 5 minutes of exercise (make sure you time it), and if at the end of 5 minutes (this is the hardest part of the work-out), if you still feel like you want stop (or not motivated to continue) then allow yourself to stop. Otherwise, more often than not you will feel encouraged that you can continue for a little while longer or even more!   My 5 to Fitness!  5: fruits and vegetables per day (work on 9 per day if you are at 5) 4: exercise 4-5 times per week for at least 30 minutes (walking counts!) 3: meals per day (don't skip breakfast!), no more than 5 hours between meals 2: habits to quit -smoking -excess alcohol use (men >2 beer/day; women >1beer/day) 1: sweet per day (2 cookies, 1 small cup of ice cream, 12 oz soda)  These are general tips for healthy living. Try to start with 1 or 2 habit TODAY and make it a part of your life for several months. You set a goal today to work on:  Once you have 1 or 2 habits down for several months, try to begin working on your next healthy habit. With every single step you take, you  will be leading a healthier lifestyle!   Diet Recommendations for Diabetes   Starchy (carb) foods include: Bread, rice, pasta, potatoes, corn, crackers, bagels, muffins, all baked goods.   Protein foods include: Meat, fish, poultry, eggs, dairy foods, and beans such as pinto and kidney beans (beans also provide carbohydrate).   1. Eat at least 3 meals and 1-2 snacks per day. Never go more than 4-5 hours while awake without eating.   2. Limit starchy foods to TWO per meal and ONE per snack. ONE portion of a starchy  food is equal to the following:   - ONE slice of bread (or its equivalent, such as half of a hamburger bun).   - 1/2 cup of a "scoopable" starchy food such as potatoes or rice.   - 1 OUNCE (28 grams) of starchy snacks (crackers or pretzels, look on label).   - 15 grams of carbohydrate as shown on food label.   3. Both lunch and  dinner should include a protein food, a carb food, and vegetables.   - Obtain twice as many veg's as protein or carbohydrate foods for both lunch and dinner.   - Try to keep frozen veg's on hand for a quick vegetable serving.     - Fresh or frozen veg's are best.   4. Breakfast should always include protein.    You can learn more information online about your diabetes at American Diabetes Association: http://www.diabetes.org/ - General self-care (diet, medications, blood sugar checks). - Diet recommendations - There are even recipes available for you to look at and try.  You will receive a survey after today's visit either digitally by e-mail or paper by [employer]. Your experiences and feedback matter to [place].  Please respond so we know how we are doing as we provide care for you.  Call us with any questions/concerns/needs.  It is my goal to be available to you for your health concerns.  Thanks for choosing me to be a partner in your healthcare needs!  Harlin Rain, FNP-C Family Nurse Practitioner [facility] Phone: [phone]

## 2019-08-30 NOTE — Progress Notes (Addendum)
Subjective:    Patient ID: [name], male    DOB: 17-May-1956, 64 y.o.   MRN: [id number]  [name] is a 64 y.o. male presenting on 08/30/2019 for Diabetes (diabetic foot exam )   HPI  [name] presents to clinic today for diabetic foot exam and paperwork for his diabetic shoes.  Denies any acute concerns today.  Depression screen [facility] 2/9 08/01/2019 02/10/2019 11/08/2018  Decreased Interest 3 1 1   Down, Depressed, Hopeless 1 0 1  PHQ - 2 Score 4 1 2   Altered sleeping 2 3 0  Tired, decreased energy 3 3 2   Change in appetite 0 0 1  Feeling bad or failure about yourself  0 0 0  Trouble concentrating 3 1 0  Moving slowly or fidgety/restless 0 0 2  Suicidal thoughts 0 0 0  PHQ-9 Score 12 8 7   Difficult doing work/chores Somewhat difficult Not difficult at all Not difficult at all    Social History   Tobacco Use  . Smoking status: Former Smoker    Packs/day: 1.00    Years: 40.00    Pack years: 40.00    Types: Cigarettes    Quit date: 07/25/2018    Years since quitting: 1.0  . Smokeless tobacco: Former Network engineer Use Topics  . Alcohol use: Not Currently    Comment: quit 2003, drank about 5 beer per week prior  . Drug use: Never    Review of Systems  Constitutional: Negative.   HENT: Negative.   Eyes: Negative.   Respiratory: Negative.   Cardiovascular: Negative.   Gastrointestinal: Negative.   Endocrine: Negative.   Genitourinary: Negative.   Musculoskeletal: Negative.   Skin: Negative.   Allergic/Immunologic: Negative.   Neurological: Negative.   Hematological: Negative.   Psychiatric/Behavioral: Negative.    Per HPI unless specifically indicated above     Objective:    BP 108/71 (BP Location: Right Arm, Patient Position: Sitting, Cuff Size: Normal)   Pulse (!) 102   Temp (!) 97.3 F (36.3 C) (Temporal)   Ht 5\' 6"  (1.676 m)   Wt 160 lb (72.6 kg)   BMI 25.82 kg/m   Wt Readings from Last 3 Encounters:  08/30/19 160 lb (72.6 kg)   08/01/19 160 lb 9.6 oz (72.8 kg)  02/10/19 159 lb 3.2 oz (72.2 kg)    Physical Exam Vitals reviewed.  Constitutional:      General: He is not in acute distress.    Appearance: Normal appearance. He is not ill-appearing or toxic-appearing.  HENT:     Head: Normocephalic.  Eyes:     General:        Right eye: No discharge.        Left eye: No discharge.     Extraocular Movements: Extraocular movements intact.     Conjunctiva/sclera: Conjunctivae normal.     Pupils: Pupils are equal, round, and reactive to light.  Cardiovascular:     Rate and Rhythm: Normal rate and regular rhythm.     Pulses: Normal pulses.     Heart sounds: Normal heart sounds. No murmur. No friction rub. No gallop.   Pulmonary:     Effort: Pulmonary effort is normal. No respiratory distress.     Breath sounds: Normal breath sounds.  Feet:     Comments: Absent of callus and all other lesions.  Some dry skin noted, but pt reports regular foot care and can see bottom of his feet.  Skin:  General: Skin is warm and dry.     Capillary Refill: Capillary refill takes less than 2 seconds.  Neurological:     General: No focal deficit present.     Mental Status: He is alert and oriented to person, place, and time.     Cranial Nerves: No cranial nerve deficit.     Sensory: No sensory deficit.     Motor: No weakness.     Coordination: Coordination normal.     Gait: Gait normal.  Psychiatric:        Attention and Perception: Attention and perception normal.        Mood and Affect: Mood and affect normal.        Speech: Speech normal.        Behavior: Behavior normal. Behavior is cooperative.        Thought Content: Thought content normal.        Cognition and Memory: Cognition and memory normal.    Diabetic Foot Exam - Simple   Simple Foot Form Diabetic Foot exam was performed with the following findings: Yes 08/30/2019  5:03 PM  Visual Inspection See comments: Yes Sensation Testing See comments: Yes Pulse  Check Posterior Tibialis and Dorsalis pulse intact bilaterally: Yes Comments Ulcerations noted bilaterally around base of great right toe without other deformities. Peripheral neuropathy bilaterally noted from toes through mid arch.      Results for orders placed or performed in visit on 02/10/19  Comprehensive metabolic panel  Result Value Ref Range   Glucose, Bld 204 (H) 65 - 99 mg/dL   BUN 22 7 - 25 mg/dL   Creat 1.25 0.70 - 1.25 mg/dL   BUN/Creatinine Ratio NOT APPLICABLE 6 - 22 (calc)   Sodium 136 135 - 146 mmol/L   Potassium 4.5 3.5 - 5.3 mmol/L   Chloride 104 98 - 110 mmol/L   CO2 24 20 - 32 mmol/L   Calcium 9.7 8.6 - 10.3 mg/dL   Total Protein 7.3 6.1 - 8.1 g/dL   Albumin 4.3 3.6 - 5.1 g/dL   Globulin 3.0 1.9 - 3.7 g/dL (calc)   AG Ratio 1.4 1.0 - 2.5 (calc)   Total Bilirubin 0.8 0.2 - 1.2 mg/dL   Alkaline phosphatase (APISO) 61 35 - 144 U/L   AST 31 10 - 35 U/L   ALT 45 9 - 46 U/L  POCT glycosylated hemoglobin (Hb A1C)  Result Value Ref Range   Hemoglobin A1C 6.3 (A) 4.0 - 5.6 %   HbA1c POC (<> result, manual entry)     HbA1c, POC (prediabetic range)     HbA1c, POC (controlled diabetic range)        Assessment & Plan:   Problem List Items Addressed This Visit    Type 2 diabetes mellitus with other specified complication ([place]) - Primary    Visit for diabetic shoes.  Has been tolerating metformin 1000mg  PO twice daily.    ControlledDM with A1c 6.3%, needing updated A1C at next T2DM follow up visit and goal A1c < 7.0%. - Complications - peripheral neuropathy.  Plan:  1. Continue current therapy: Metformin 1000mg  twice daily 2. Encourage improved lifestyle: - low carb/low glycemic diet reinforced prior education - Increase physical activity to 30 minutes most days of the week.  Explained that increased physical activity increases body's use of sugar for energy. 3. Check fasting am CBG and bring log to clinic.  Bring log to next visit for review 4. Continue  ACEi and Statin 5. DM Foot  exam done today with no acute changes/ findings.   and Advised to schedule DM ophtho exam, send record. 6. Follow-up 1 month for DM office visit           No orders of the defined types were placed in this encounter.  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 See exam note.  Plan - Proceed with ordering Diabetic Shoes, completed form today, will fax back to [place] along with this office note - Patient would benefit from Diabetic Shoes due to neuropathy with callus formation , diabetes control is improving on current regimen, and I am continuing to monitor and manage diabetes.  Controlled diabetic with A1C of 6.3% Without evidence of hyperglycemia or hypoglycemia Continue current meds: metformin 1000mg  twice per day  This patient would benefit from and needs Diabetic Shoes due to the following indicated complications of their diabetes: - Peripheral neuropathy with evidence of callus formation  Will proceed with authorizing order for diabetic shoes    Follow up plan: Return in about 4 weeks (around 09/27/2019) for DM F/U, A1C.   Harlin Rain, [place] [facility] Nurse Practitioner [facility] 08/30/2019, 2:34 PM

## 2019-08-30 NOTE — Assessment & Plan Note (Signed)
Visit for diabetic shoes.  Has been tolerating metformin 1000mg  PO twice daily.    ControlledDM with A1c 6.3%, needing updated A1C at next T2DM follow up visit and goal A1c < 7.0%. - Complications - peripheral neuropathy.  Plan:  1. Continue current therapy: Metformin 1000mg  twice daily 2. Encourage improved lifestyle: - low carb/low glycemic diet reinforced prior education - Increase physical activity to 30 minutes most days of the week.  Explained that increased physical activity increases body's use of sugar for energy. 3. Check fasting am CBG and bring log to clinic.  Bring log to next visit for review 4. Continue ACEi and Statin 5. DM Foot exam done today with no acute changes/ findings.   and Advised to schedule DM ophtho exam, send record. 6. Follow-up 1 month for DM office visit

## 2019-08-31 DIAGNOSIS — E119 Type 2 diabetes mellitus without complications: Secondary | ICD-10-CM | POA: Diagnosis not present

## 2019-09-16 ENCOUNTER — Telehealth: Payer: Self-pay

## 2019-09-16 NOTE — Telephone Encounter (Signed)
I called the patient back and gave him [facility] clinic [facility] Dept. Number to call and find out when his last colonoscopy was done.

## 2019-09-19 ENCOUNTER — Telehealth: Payer: Self-pay | Admitting: Family Medicine

## 2019-09-19 NOTE — Telephone Encounter (Signed)
Attempted to contact the patient, no answer. I will try again later today.

## 2019-09-19 NOTE — Telephone Encounter (Signed)
Copied from [place] [id number]. Topic: General - Other >> Sep 19, 2019  9:52 AM Keene Breath wrote: Reason for CRM: Patient called to let the office know that when he went to [facility] clinic to have his colon checked, they said he needed to go to PCP to sign a medical release form and bring that back to the clinic to have it done.  Patient is not sure when he needs to do and would like the nurse to call him to discuss.  CB# [phone]

## 2019-09-21 ENCOUNTER — Other Ambulatory Visit: Payer: Self-pay | Admitting: Family Medicine

## 2019-09-21 DIAGNOSIS — Z1211 Encounter for screening for malignant neoplasm of colon: Secondary | ICD-10-CM

## 2019-09-21 NOTE — Progress Notes (Signed)
Referral for colonoscopy placed.  Received previous colonoscopy report from 06/2014.  Report received with polyp results.  Lab report "Diagnosis: A. Colon polyp, ascending; cold biopsy -Tubular adenoma - negative for high grade dysplasia and malignancy B. Colon polyp, transverse, proximal, cold snare -tubular adenoma -negative for high grade dysplasia and malignancy"

## 2019-09-22 ENCOUNTER — Telehealth: Payer: Self-pay

## 2019-09-22 DIAGNOSIS — Z87891 Personal history of nicotine dependence: Secondary | ICD-10-CM

## 2019-09-22 DIAGNOSIS — Z122 Encounter for screening for malignant neoplasm of respiratory organs: Secondary | ICD-10-CM

## 2019-09-22 NOTE — Telephone Encounter (Signed)
Patient has been notified that the low dose lung cancer screening CT scan is due currently or will be in near future.  Confirmed that patient is within the appropriate age range and asymptomatic, (no signs or symptoms of lung cancer).  Patient denies illness that would prevent curative treatment for lung cancer if found.  Patient is agreeable for CT scan being scheduled.    Verified smoking history (former smoker, quit 07/2018 with 40 year 1 ppd history).   Lung screening CT scan is scheduled for 10/12/2019 @ 12:00.

## 2019-09-26 NOTE — Addendum Note (Signed)
Addended by: Lieutenant Diego on: 09/26/2019 11:04 AM   Modules accepted: Orders

## 2019-09-26 NOTE — Telephone Encounter (Signed)
Smoking history: former, quit 07/2018, 50 pack year

## 2019-09-28 ENCOUNTER — Telehealth (INDEPENDENT_AMBULATORY_CARE_PROVIDER_SITE_OTHER): Payer: Self-pay | Admitting: Gastroenterology

## 2019-09-28 DIAGNOSIS — Z1211 Encounter for screening for malignant neoplasm of colon: Secondary | ICD-10-CM

## 2019-09-28 NOTE — Progress Notes (Signed)
Gastroenterology Pre-Procedure Review  Request Date: 10/19/19 Requesting Physician: Dr. Vicente Males  PATIENT REVIEW QUESTIONS: The patient responded to the following health history questions as indicated:    1. Are you having any GI issues? no 2. Do you have a personal history of Polyps? yes (2016) 3. Do you have a family history of Colon Cancer or Polyps? no 4. Diabetes Mellitus? yes (oral meds) 5. Joint replacements in the past 12 months?no 6. Major health problems in the past 3 months?no 7. Any artificial heart valves, MVP, or defibrillator?no    MEDICATIONS & ALLERGIES:    Patient reports the following regarding taking any anticoagulation/antiplatelet therapy:   Plavix, Coumadin, Eliquis, Xarelto, Lovenox, Pradaxa, Brilinta, or Effient? no Aspirin? no  Patient confirms/reports the following medications:  Current Outpatient Medications  Medication Sig Dispense Refill  . atorvastatin (LIPITOR) 40 MG tablet TAKE 1 TABLET BY MOUTH AT BEDTIME FOR HIGH CHOLESTEROL 90 tablet 1  . famotidine (PEPCID) 20 MG tablet Take 1 tablet (20 mg total) by mouth 2 (two) times daily. 180 tablet 1  . hydrochlorothiazide (MICROZIDE) 12.5 MG capsule TAKE ONE CAPSULE BY MOUTH ONCE DAILY FOR HIGH BLOOD PRESSURE 90 capsule 1  . lisinopril (ZESTRIL) 20 MG tablet Take 1 tablet (20 mg total) by mouth daily. for high blood pressure 90 tablet 1  . Melatonin 10 MG CAPS Take by mouth.    . metFORMIN (GLUCOPHAGE) 1000 MG tablet Take 1 tablet twice daily with food for Diabetes 180 tablet 1  . Multiple Vitamins-Minerals (SENTRY ADULT) TABS Take by mouth.    . Multiple Vitamins-Minerals (VITAMIN D3 COMPLETE PO) Take by mouth.    . traZODone (DESYREL) 100 MG tablet Take 0.5-1 tablets (50-100 mg total) by mouth at bedtime as needed for sleep. 90 tablet 1  . albuterol (PROVENTIL) (2.5 MG/3ML) 0.083% nebulizer solution Take 3 mLs (2.5 mg total) by nebulization every 6 (six) hours as needed for wheezing or shortness of breath.  (Patient not taking: Reported on 09/28/2019) 75 mL 1  . omeprazole (PRILOSEC) 20 MG capsule Take 1 capsule (20 mg total) by mouth daily. (Patient not taking: Reported on 09/28/2019) 90 capsule 1   No current facility-administered medications for this visit.    Patient confirms/reports the following allergies:  Allergies  Allergen Reactions  . Codeine     Other reaction(s): Vomiting    No orders of the defined types were placed in this encounter.   AUTHORIZATION INFORMATION Primary Insurance: 1D#: Group #:  Secondary Insurance: 1D#: Group #:  SCHEDULE INFORMATION: Date: 10/19/19 Time: Location:[facility]

## 2019-10-06 NOTE — Telephone Encounter (Signed)
Message left informing patient of low dose lung cancer screening CT scan appointment on 10/12/19 @ 11:45.

## 2019-10-12 ENCOUNTER — Ambulatory Visit
Admission: RE | Admit: 2019-10-12 | Discharge: 2019-10-12 | Disposition: A | Discharge status: Home / Self Care | Payer: Medicare Other | Source: Ambulatory Visit | Attending: Oncology | Admitting: Oncology

## 2019-10-12 ENCOUNTER — Other Ambulatory Visit: Payer: Self-pay

## 2019-10-12 DIAGNOSIS — Z87891 Personal history of nicotine dependence: Secondary | ICD-10-CM | POA: Diagnosis not present

## 2019-10-12 DIAGNOSIS — F17210 Nicotine dependence, cigarettes, uncomplicated: Secondary | ICD-10-CM | POA: Diagnosis not present

## 2019-10-12 DIAGNOSIS — Z122 Encounter for screening for malignant neoplasm of respiratory organs: Secondary | ICD-10-CM | POA: Diagnosis not present

## 2019-10-14 ENCOUNTER — Encounter: Payer: Self-pay | Admitting: *Deleted

## 2019-10-17 ENCOUNTER — Other Ambulatory Visit
Admission: RE | Admit: 2019-10-17 | Discharge: 2019-10-17 | Disposition: A | Discharge status: Home / Self Care | Payer: Medicare Other | Source: Ambulatory Visit | Attending: Gastroenterology | Admitting: Gastroenterology

## 2019-10-17 ENCOUNTER — Other Ambulatory Visit: Payer: Self-pay

## 2019-10-17 DIAGNOSIS — Z01812 Encounter for preprocedural laboratory examination: Secondary | ICD-10-CM | POA: Insufficient documentation

## 2019-10-17 DIAGNOSIS — Z20822 Contact with and (suspected) exposure to covid-19: Secondary | ICD-10-CM | POA: Insufficient documentation

## 2019-10-17 LAB — SARS CORONAVIRUS 2 (TAT 6-24 HRS): SARS Coronavirus 2: NEGATIVE

## 2019-10-19 ENCOUNTER — Encounter: Payer: Self-pay | Admitting: Certified Registered Nurse Anesthetist

## 2019-10-19 ENCOUNTER — Encounter
Admission: RE | Disposition: A | Discharge status: Home / Self Care | Payer: Self-pay | Source: Home / Self Care | Attending: Gastroenterology

## 2019-10-19 ENCOUNTER — Encounter: Payer: Self-pay | Admitting: Gastroenterology

## 2019-10-19 ENCOUNTER — Ambulatory Visit
Admission: RE | Admit: 2019-10-19 | Discharge: 2019-10-19 | Disposition: A | Discharge status: Home / Self Care | Payer: Medicare Other | Attending: Gastroenterology | Admitting: Gastroenterology

## 2019-10-19 ENCOUNTER — Other Ambulatory Visit: Payer: Self-pay

## 2019-10-19 ENCOUNTER — Telehealth: Payer: Self-pay

## 2019-10-19 DIAGNOSIS — Z538 Procedure and treatment not carried out for other reasons: Secondary | ICD-10-CM | POA: Diagnosis not present

## 2019-10-19 DIAGNOSIS — F419 Anxiety disorder, unspecified: Secondary | ICD-10-CM | POA: Diagnosis not present

## 2019-10-19 DIAGNOSIS — Z1211 Encounter for screening for malignant neoplasm of colon: Secondary | ICD-10-CM | POA: Diagnosis not present

## 2019-10-19 DIAGNOSIS — Z7984 Long term (current) use of oral hypoglycemic drugs: Secondary | ICD-10-CM | POA: Diagnosis not present

## 2019-10-19 DIAGNOSIS — J449 Chronic obstructive pulmonary disease, unspecified: Secondary | ICD-10-CM | POA: Insufficient documentation

## 2019-10-19 DIAGNOSIS — Z79899 Other long term (current) drug therapy: Secondary | ICD-10-CM | POA: Insufficient documentation

## 2019-10-19 DIAGNOSIS — Z8719 Personal history of other diseases of the digestive system: Secondary | ICD-10-CM | POA: Insufficient documentation

## 2019-10-19 DIAGNOSIS — G4700 Insomnia, unspecified: Secondary | ICD-10-CM | POA: Diagnosis not present

## 2019-10-19 DIAGNOSIS — K219 Gastro-esophageal reflux disease without esophagitis: Secondary | ICD-10-CM | POA: Insufficient documentation

## 2019-10-19 DIAGNOSIS — E119 Type 2 diabetes mellitus without complications: Secondary | ICD-10-CM | POA: Diagnosis not present

## 2019-10-19 DIAGNOSIS — I10 Essential (primary) hypertension: Secondary | ICD-10-CM | POA: Insufficient documentation

## 2019-10-19 DIAGNOSIS — Z87891 Personal history of nicotine dependence: Secondary | ICD-10-CM | POA: Insufficient documentation

## 2019-10-19 SURGERY — COLONOSCOPY WITH PROPOFOL
Anesthesia: General

## 2019-10-19 NOTE — Telephone Encounter (Signed)
Called patient to let him know that we will be leaving a prep kit at the front office for him to pick up. Patient did not answer his call, therefore, I left him a voicemail to come back to the office.

## 2019-10-19 NOTE — H&P (Signed)
Jonathon Bellows, MD [address], [place], [place], [place], [address] [address], [place], [place], [place], [address] Phone: [phone]  Fax: [phone]  Primary Care Physician:  Verl Bangs, FNP   Pre-Procedure History & Physical: HPI:  [name] is a 64 y.o. male is here for an colonoscopy.   Past Medical History:  Diagnosis Date  . Anxiety   . COPD (chronic obstructive pulmonary disease) ([place])   . Diabetes mellitus without complication ([place])   . GERD (gastroesophageal reflux disease)   . Hypertension   . Insomnia   . Personal history of tobacco use, presenting hazards to health 05/31/2015    Past Surgical History:  Procedure Laterality Date  . HERNIA REPAIR Left 2003    Prior to Admission medications   Medication Sig Start Date End Date Taking? Authorizing Provider  albuterol (PROVENTIL) (2.5 MG/3ML) 0.083% nebulizer solution Take 3 mLs (2.5 mg total) by nebulization every 6 (six) hours as needed for wheezing or shortness of breath. Patient not taking: Reported on 09/28/2019 08/01/19   Verl Bangs, FNP  atorvastatin (LIPITOR) 40 MG tablet TAKE 1 TABLET BY MOUTH AT BEDTIME FOR HIGH CHOLESTEROL 08/01/19   Malfi, Lupita Raider, FNP  famotidine (PEPCID) 20 MG tablet Take 1 tablet (20 mg total) by mouth 2 (two) times daily. 08/01/19   Malfi, Lupita Raider, FNP  hydrochlorothiazide (MICROZIDE) 12.5 MG capsule TAKE ONE CAPSULE BY MOUTH ONCE DAILY FOR HIGH BLOOD PRESSURE 08/01/19   Malfi, Lupita Raider, FNP  lisinopril (ZESTRIL) 20 MG tablet Take 1 tablet (20 mg total) by mouth daily. for high blood pressure 08/01/19   Malfi, Lupita Raider, FNP  Melatonin 10 MG CAPS Take by mouth.    [provider]  metFORMIN (GLUCOPHAGE) 1000 MG tablet Take 1 tablet twice daily with food for Diabetes 08/01/19   Malfi, Lupita Raider, FNP  Multiple Vitamins-Minerals (SENTRY ADULT) TABS Take by mouth.    [provider]  Multiple Vitamins-Minerals (VITAMIN D3 COMPLETE PO) Take by mouth.     [provider]  omeprazole (PRILOSEC) 20 MG capsule Take 1 capsule (20 mg total) by mouth daily. Patient not taking: Reported on 09/28/2019 08/01/19   Verl Bangs, FNP  traZODone (DESYREL) 100 MG tablet Take 0.5-1 tablets (50-100 mg total) by mouth at bedtime as needed for sleep. 02/10/19   Mikey College, NP    Allergies as of 09/29/2019 - Review Complete 09/28/2019  Allergen Reaction Noted  . Codeine  01/23/2014    Family History  Problem Relation Age of Onset  . Renal Disease Mother 88  . Heart disease Father   . Stroke Father   . Alzheimer's disease Father   . Stroke Brother   . Diabetes Brother   . Diabetes Maternal Grandfather     Social History   Socioeconomic History  . Marital status: Divorced    Spouse name: Not on file  . Number of children: 3  . Years of education: Not on file  . Highest education level: 10th grade  Occupational History  . Not on file  Tobacco Use  . Smoking status: Former Smoker    Packs/day: 1.00    Years: 40.00    Pack years: 40.00    Types: Cigarettes    Quit date: 07/25/2018    Years since quitting: 1.2  . Smokeless tobacco: Former Network engineer and Sexual Activity  . Alcohol use: Not Currently    Comment: quit 2003, drank about 5 beer per week  prior  . Drug use: Never  . Sexual activity: Yes    Birth control/protection: None  Other Topics Concern  . Not on file  Social History Narrative  . Not on file   Social Determinants of Health   Financial Resource Strain:   . Difficulty of Paying Living Expenses:   Food Insecurity:   . Worried About Charity fundraiser in the Last Year:   . Arboriculturist in the Last Year:   Transportation Needs:   . Film/video editor (Medical):   [name] Kitchen Lack of Transportation (Non-Medical):   Physical Activity:   . Days of Exercise per Week:   . Minutes of Exercise per Session:   Stress:   . Feeling of Stress :   Social Connections:   . Frequency of Communication with  Friends and Family:   . Frequency of Social Gatherings with Friends and Family:   . Attends Religious Services:   . Active Member of Clubs or Organizations:   . Attends Archivist Meetings:   [name] Kitchen Marital Status:   Intimate Partner Violence:   . Fear of Current or Ex-Partner:   . Emotionally Abused:   [name] Kitchen Physically Abused:   . Sexually Abused:     Review of Systems: See HPI, otherwise negative ROS  Physical Exam: There were no vitals taken for this visit. General:   Alert,  pleasant and cooperative in NAD Head:  Normocephalic and atraumatic. Neck:  Supple; no masses or thyromegaly. Lungs:  Clear throughout to auscultation, normal respiratory effort.    Heart:  +S1, +S2, Regular rate and rhythm, No edema. Abdomen:  Soft, nontender and nondistended. Normal bowel sounds, without guarding, and without rebound.   Neurologic:  Alert and  oriented x4;  grossly normal neurologically.  Impression/Plan: [name] is here for an colonoscopy to be performed for surveillance due to prior history of colon polyps   Risks, benefits, limitations, and alternatives regarding  colonoscopy have been reviewed with the patient.  Questions have been answered.  All parties agreeable.   Jonathon Bellows, MD  10/19/2019, 12:27 PM

## 2019-10-19 NOTE — OR Nursing (Signed)
Reported eating a piece of toast and milk at 0700 this am. Dr. Randa Lynn and Dr. Vicente Males notified. Procedure to be delayed until 1500 due to NPO status. Decision made to cancel procedure today with reschedule for tomorrow. Need for NPO status in am reinforced with understanding verbalized.

## 2019-10-20 ENCOUNTER — Encounter
Admission: RE | Disposition: A | Discharge status: Home / Self Care | Payer: Self-pay | Source: Home / Self Care | Attending: Gastroenterology

## 2019-10-20 ENCOUNTER — Ambulatory Visit
Admission: RE | Admit: 2019-10-20 | Discharge: 2019-10-20 | Disposition: A | Discharge status: Home / Self Care | Payer: Medicare Other | Attending: Gastroenterology | Admitting: Gastroenterology

## 2019-10-20 ENCOUNTER — Other Ambulatory Visit: Payer: Self-pay

## 2019-10-20 ENCOUNTER — Ambulatory Visit: Payer: Medicare Other | Admitting: Certified Registered"

## 2019-10-20 ENCOUNTER — Encounter: Payer: Self-pay | Admitting: Gastroenterology

## 2019-10-20 DIAGNOSIS — Z79899 Other long term (current) drug therapy: Secondary | ICD-10-CM | POA: Diagnosis not present

## 2019-10-20 DIAGNOSIS — J449 Chronic obstructive pulmonary disease, unspecified: Secondary | ICD-10-CM | POA: Diagnosis not present

## 2019-10-20 DIAGNOSIS — E119 Type 2 diabetes mellitus without complications: Secondary | ICD-10-CM | POA: Insufficient documentation

## 2019-10-20 DIAGNOSIS — Z1211 Encounter for screening for malignant neoplasm of colon: Secondary | ICD-10-CM | POA: Diagnosis not present

## 2019-10-20 DIAGNOSIS — G4700 Insomnia, unspecified: Secondary | ICD-10-CM | POA: Diagnosis not present

## 2019-10-20 DIAGNOSIS — Z7984 Long term (current) use of oral hypoglycemic drugs: Secondary | ICD-10-CM | POA: Diagnosis not present

## 2019-10-20 DIAGNOSIS — I10 Essential (primary) hypertension: Secondary | ICD-10-CM | POA: Diagnosis not present

## 2019-10-20 DIAGNOSIS — Z538 Procedure and treatment not carried out for other reasons: Secondary | ICD-10-CM | POA: Diagnosis not present

## 2019-10-20 DIAGNOSIS — Z87891 Personal history of nicotine dependence: Secondary | ICD-10-CM | POA: Diagnosis not present

## 2019-10-20 DIAGNOSIS — K219 Gastro-esophageal reflux disease without esophagitis: Secondary | ICD-10-CM | POA: Diagnosis not present

## 2019-10-20 DIAGNOSIS — K5790 Diverticulosis of intestine, part unspecified, without perforation or abscess without bleeding: Secondary | ICD-10-CM | POA: Diagnosis not present

## 2019-10-20 DIAGNOSIS — K5730 Diverticulosis of large intestine without perforation or abscess without bleeding: Secondary | ICD-10-CM

## 2019-10-20 DIAGNOSIS — Z86010 Personal history of colonic polyps: Secondary | ICD-10-CM | POA: Diagnosis not present

## 2019-10-20 DIAGNOSIS — J432 Centrilobular emphysema: Secondary | ICD-10-CM | POA: Diagnosis not present

## 2019-10-20 HISTORY — PX: COLONOSCOPY WITH PROPOFOL: SHX5780

## 2019-10-20 LAB — GLUCOSE, CAPILLARY: Glucose-Capillary: 138 mg/dL — ABNORMAL HIGH (ref 70–99)

## 2019-10-20 SURGERY — COLONOSCOPY WITH PROPOFOL
Anesthesia: General

## 2019-10-20 MED ORDER — PROPOFOL 500 MG/50ML IV EMUL
INTRAVENOUS | Status: AC
  Filled 2019-10-20: qty 50

## 2019-10-20 MED ORDER — PROPOFOL 10 MG/ML IV BOLUS
INTRAVENOUS | Status: DC | PRN
Start: 2019-10-20 — End: 2019-10-20
  Administered 2019-10-20: 50 mg via INTRAVENOUS

## 2019-10-20 MED ORDER — SODIUM CHLORIDE 0.9 % IV SOLN: INTRAVENOUS | Status: DC

## 2019-10-20 MED ORDER — PROPOFOL 500 MG/50ML IV EMUL
INTRAVENOUS | Status: DC | PRN
  Administered 2019-10-20: 150 ug/kg/min via INTRAVENOUS

## 2019-10-20 NOTE — Transfer of Care (Signed)
Immediate Anesthesia Transfer of Care Note  Patient: [name]  Procedure(s) Performed: COLONOSCOPY WITH PROPOFOL (N/A )  Patient Location: Endoscopy Unit  Anesthesia Type:General  Level of Consciousness: awake, alert  and oriented  Airway & Oxygen Therapy: Patient Spontanous Breathing and Patient connected to nasal cannula oxygen  Post-op Assessment: Report given to RN and Post -op Vital signs reviewed and stable  Post vital signs: Reviewed and stable  Last Vitals:  Vitals Value Taken Time  BP    Temp    Pulse    Resp    SpO2      Last Pain:  Vitals:   10/20/19 1035  TempSrc: Temporal  PainSc: 0-No pain         Complications: No apparent anesthesia complications

## 2019-10-20 NOTE — Op Note (Signed)
[facility] Gastroenterology Patient Name: [name] Procedure Date: 10/20/2019 10:44 AM MRN: [id number] Account #: [id number] Date of Birth: 1956-04-15 Admit Type: Outpatient Age: 64 Room: [facility] ENDO ROOM 2 Gender: Male Note Status: Finalized Procedure:             Colonoscopy Indications:           High risk colon cancer surveillance: Personal history                         of colonic polyps, Last colonoscopy: February 2016 Providers:             Jonathon Bellows MD, MD Referring MD:          Lupita Raider. Malfi (Referring MD) Medicines:             Monitored Anesthesia Care Complications:         No immediate complications. Procedure:             Pre-Anesthesia Assessment:                        - Prior to the procedure, a History and Physical was                         performed, and patient medications, allergies and                         sensitivities were reviewed. The patient's tolerance                         of previous anesthesia was reviewed.                        - The risks and benefits of the procedure and the                         sedation options and risks were discussed with the                         patient. All questions were answered and informed                         consent was obtained.                        - ASA Grade Assessment: II - A patient with mild                         systemic disease.                        After obtaining informed consent, the colonoscope was                         passed under direct vision. Throughout the procedure,                         the patient's blood pressure, pulse, and oxygen                         saturations were monitored  continuously. The                         Colonoscope was introduced through the anus with the                         intention of advancing to the cecum. The scope was                         advanced to the ascending colon before the procedure    was aborted. Medications were given. The colonoscopy                         was performed without difficulty. The patient                         tolerated the procedure well. The quality of the bowel                         preparation was inadequate. Findings:      The perianal and digital rectal examinations were normal.      A large amount of semi-solid stool was found in the transverse colon and       in the ascending colon, interfering with visualization.      Multiple small-mouthed diverticula were found in the left colon. Impression:            - Preparation of the colon was inadequate.                        - Stool in the transverse colon and in the ascending                         colon.                        - Diverticulosis in the left colon.                        - No specimens collected. Recommendation:        - Discharge patient to home (with escort).                        - Resume previous diet.                        - Continue present medications.                        - Repeat colonoscopy because the bowel preparation was                         suboptimal. Procedure Code(s):     --- Professional ---                        [phone], 53, Colonoscopy, flexible; diagnostic,                         including collection of specimen(s) by brushing or  washing, when performed (separate procedure) Diagnosis Code(s):     --- Professional ---                        Z86.010, Personal history of colonic polyps                        K57.30, Diverticulosis of large intestine without                         perforation or abscess without bleeding CPT copyright 2019 American Medical Association. All rights reserved. The codes documented in this report are preliminary and upon coder review may  be revised to meet current compliance requirements. Jonathon Bellows, MD Jonathon Bellows MD, MD 10/20/2019 11:22:14 AM This report has been signed electronically. Number of  Addenda: 0 Note Initiated On: 10/20/2019 10:44 AM Total Procedure Duration: 0 hours 10 minutes 20 seconds  Estimated Blood Loss:  Estimated blood loss: none.      [facility]

## 2019-10-20 NOTE — Anesthesia Postprocedure Evaluation (Signed)
Anesthesia Post Note  Patient: [name]  Procedure(s) Performed: COLONOSCOPY WITH PROPOFOL (N/A )  Patient location during evaluation: Endoscopy Anesthesia Type: General Level of consciousness: awake and alert Pain management: pain level controlled Vital Signs Assessment: post-procedure vital signs reviewed and stable Respiratory status: spontaneous breathing, nonlabored ventilation, respiratory function stable and patient connected to nasal cannula oxygen Cardiovascular status: blood pressure returned to baseline and stable Postop Assessment: no apparent nausea or vomiting Anesthetic complications: no     Last Vitals:  Vitals:   10/20/19 1130 10/20/19 1200  BP: 121/81   Pulse: 62 68  Resp: 20 (!) 21  Temp:    SpO2: 98% 100%    Last Pain:  Vitals:   10/20/19 1120  TempSrc: Temporal  PainSc:                  Martha Clan

## 2019-10-20 NOTE — Anesthesia Preprocedure Evaluation (Signed)
Anesthesia Evaluation  Patient identified by MRN, date of birth, ID band Patient awake    Reviewed: Allergy & Precautions, H&P , NPO status , Patient's Chart, lab work & pertinent test results, reviewed documented beta blocker date and time   History of Anesthesia Complications Negative for: history of anesthetic complications  Airway Mallampati: IV  TM Distance: >3 FB Neck ROM: full    Dental  (+) Dental Advidsory Given, Edentulous Upper, Edentulous Lower, Upper Dentures, Lower Dentures   Pulmonary neg shortness of breath, COPD, neg recent URI, former smoker,    Pulmonary exam normal breath sounds clear to auscultation       Cardiovascular Exercise Tolerance: Good hypertension, (-) angina(-) Past MI and (-) Cardiac Stents Normal cardiovascular exam(-) dysrhythmias (-) Valvular Problems/Murmurs Rhythm:regular Rate:Normal     Neuro/Psych PSYCHIATRIC DISORDERS Anxiety negative neurological ROS     GI/Hepatic GERD  ,(+) Hepatitis - (s/p treatment), C  Endo/Other  diabetes  Renal/GU negative Renal ROS  negative genitourinary   Musculoskeletal   Abdominal   Peds  Hematology negative hematology ROS (+)   Anesthesia Other Findings Past Medical History: No date: Anxiety No date: COPD (chronic obstructive pulmonary disease) (HCC) No date: Diabetes mellitus without complication (HCC) No date: GERD (gastroesophageal reflux disease) No date: Hypertension No date: Insomnia 05/31/2015: Personal history of tobacco use, presenting hazards to  health   Reproductive/Obstetrics negative OB ROS                             Anesthesia Physical Anesthesia Plan  ASA: III  Anesthesia Plan: General   Post-op Pain Management:    Induction: Intravenous  PONV Risk Score and Plan: 2 and Propofol infusion and TIVA  Airway Management Planned: Natural Airway and Nasal Cannula  Additional Equipment:    Intra-op Plan:   Post-operative Plan:   Informed Consent: I have reviewed the patients History and Physical, chart, labs and discussed the procedure including the risks, benefits and alternatives for the proposed anesthesia with the patient or authorized representative who has indicated his/her understanding and acceptance.     Dental Advisory Given  Plan Discussed with: Anesthesiologist, CRNA and Surgeon  Anesthesia Plan Comments:         Anesthesia Quick Evaluation

## 2019-10-20 NOTE — H&P (Signed)
William Bellows, MD [address] [place], [place], [place], [address] [address], [place], [place], [place], [address] Phone: [phone]  Fax: [phone]  Primary Care Physician:  Verl Bangs, FNP   Pre-Procedure History & Physical: HPI:  [name] is a 64 y.o. male is here for an colonoscopy.   Past Medical History:  Diagnosis Date  . Anxiety   . COPD (chronic obstructive pulmonary disease) ([place])   . Diabetes mellitus without complication ([place])   . GERD (gastroesophageal reflux disease)   . Hypertension   . Insomnia   . Personal history of tobacco use, presenting hazards to health 05/31/2015    Past Surgical History:  Procedure Laterality Date  . HERNIA REPAIR Left 2003    Prior to Admission medications   Medication Sig Start Date End Date Taking? Authorizing Provider  albuterol (PROVENTIL) (2.5 MG/3ML) 0.083% nebulizer solution Take 3 mLs (2.5 mg total) by nebulization every 6 (six) hours as needed for wheezing or shortness of breath. Patient not taking: Reported on 09/28/2019 08/01/19   Verl Bangs, FNP  atorvastatin (LIPITOR) 40 MG tablet TAKE 1 TABLET BY MOUTH AT BEDTIME FOR HIGH CHOLESTEROL 08/01/19   Malfi, Lupita Raider, FNP  famotidine (PEPCID) 20 MG tablet Take 1 tablet (20 mg total) by mouth 2 (two) times daily. 08/01/19   Malfi, Lupita Raider, FNP  hydrochlorothiazide (MICROZIDE) 12.5 MG capsule TAKE ONE CAPSULE BY MOUTH ONCE DAILY FOR HIGH BLOOD PRESSURE 08/01/19   Malfi, Lupita Raider, FNP  lisinopril (ZESTRIL) 20 MG tablet Take 1 tablet (20 mg total) by mouth daily. for high blood pressure 08/01/19   Malfi, Lupita Raider, FNP  Melatonin 10 MG CAPS Take by mouth.    [provider]  metFORMIN (GLUCOPHAGE) 1000 MG tablet Take 1 tablet twice daily with food for Diabetes 08/01/19   Malfi, Lupita Raider, FNP  Multiple Vitamins-Minerals (SENTRY ADULT) TABS Take by mouth.    [provider]  Multiple Vitamins-Minerals (VITAMIN D3 COMPLETE PO) Take by mouth.     [provider]  omeprazole (PRILOSEC) 20 MG capsule Take 1 capsule (20 mg total) by mouth daily. Patient not taking: Reported on 09/28/2019 08/01/19   Verl Bangs, FNP  traZODone (DESYREL) 100 MG tablet Take 0.5-1 tablets (50-100 mg total) by mouth at bedtime as needed for sleep. 02/10/19   Mikey College, NP    Allergies as of 10/19/2019 - Review Complete 10/19/2019  Allergen Reaction Noted  . Codeine  01/23/2014    Family History  Problem Relation Age of Onset  . Renal Disease Mother 64  . Heart disease Father   . Stroke Father   . Alzheimer's disease Father   . Stroke Brother   . Diabetes Brother   . Diabetes Maternal Grandfather     Social History   Socioeconomic History  . Marital status: Divorced    Spouse name: Not on file  . Number of children: 3  . Years of education: Not on file  . Highest education level: 10th grade  Occupational History  . Not on file  Tobacco Use  . Smoking status: Former Smoker    Packs/day: 1.00    Years: 40.00    Pack years: 40.00    Types: Cigarettes    Quit date: 07/25/2018    Years since quitting: 1.2  . Smokeless tobacco: Former Network engineer and Sexual Activity  . Alcohol use: Not Currently    Comment: quit 2003, drank about 5 beer per week  prior  . Drug use: Never  . Sexual activity: Yes    Birth control/protection: None  Other Topics Concern  . Not on file  Social History Narrative  . Not on file   Social Determinants of Health   Financial Resource Strain:   . Difficulty of Paying Living Expenses:   Food Insecurity:   . Worried About Charity fundraiser in the Last Year:   . Arboriculturist in the Last Year:   Transportation Needs:   . Film/video editor (Medical):   [name] Kitchen Lack of Transportation (Non-Medical):   Physical Activity:   . Days of Exercise per Week:   . Minutes of Exercise per Session:   Stress:   . Feeling of Stress :   Social Connections:   . Frequency of Communication with  Friends and Family:   . Frequency of Social Gatherings with Friends and Family:   . Attends Religious Services:   . Active Member of Clubs or Organizations:   . Attends Archivist Meetings:   [name] Kitchen Marital Status:   Intimate Partner Violence:   . Fear of Current or Ex-Partner:   . Emotionally Abused:   [name] Kitchen Physically Abused:   . Sexually Abused:     Review of Systems: See HPI, otherwise negative ROS  Physical Exam: BP 140/86   Pulse 65   Temp (!) 97 F (36.1 C) (Temporal)   Resp 17   Ht 5\' 6"  (1.676 m)   Wt 72.6 kg   SpO2 100%   BMI 25.83 kg/m  General:   Alert,  pleasant and cooperative in NAD Head:  Normocephalic and atraumatic. Neck:  Supple; no masses or thyromegaly. Lungs:  Clear throughout to auscultation, normal respiratory effort.    Heart:  +S1, +S2, Regular rate and rhythm, No edema. Abdomen:  Soft, nontender and nondistended. Normal bowel sounds, without guarding, and without rebound.   Neurologic:  Alert and  oriented x4;  grossly normal neurologically.  Impression/Plan: [name] is here for an colonoscopy to be performed for surveillance due to prior history of colon polyps   Risks, benefits, limitations, and alternatives regarding  colonoscopy have been reviewed with the patient.  Questions have been answered.  All parties agreeable.   William Bellows, MD  10/20/2019, 10:44 AM

## 2019-10-20 NOTE — Progress Notes (Signed)
MD unable to reach cecum. Extent reached to ascending colon.

## 2019-10-21 ENCOUNTER — Encounter: Payer: Self-pay | Admitting: *Deleted

## 2019-11-01 ENCOUNTER — Other Ambulatory Visit: Payer: Self-pay

## 2019-11-01 ENCOUNTER — Ambulatory Visit (INDEPENDENT_AMBULATORY_CARE_PROVIDER_SITE_OTHER): Payer: Medicare Other | Admitting: Family Medicine

## 2019-11-01 ENCOUNTER — Encounter: Payer: Self-pay | Admitting: Family Medicine

## 2019-11-01 VITALS — BP 114/68 | HR 67 | Temp 97.5°F | Ht 66.0 in | Wt 157.0 lb

## 2019-11-01 DIAGNOSIS — E785 Hyperlipidemia, unspecified: Secondary | ICD-10-CM

## 2019-11-01 DIAGNOSIS — R635 Abnormal weight gain: Secondary | ICD-10-CM

## 2019-11-01 DIAGNOSIS — I10 Essential (primary) hypertension: Secondary | ICD-10-CM

## 2019-11-01 DIAGNOSIS — R5383 Other fatigue: Secondary | ICD-10-CM

## 2019-11-01 DIAGNOSIS — E1169 Type 2 diabetes mellitus with other specified complication: Secondary | ICD-10-CM

## 2019-11-01 DIAGNOSIS — F419 Anxiety disorder, unspecified: Secondary | ICD-10-CM

## 2019-11-01 LAB — POCT GLYCOSYLATED HEMOGLOBIN (HGB A1C): Hemoglobin A1C: 7.3 % — AB (ref 4.0–5.6)

## 2019-11-01 MED ORDER — BUSPIRONE HCL 5 MG PO TABS: 5.0000 mg | ORAL_TABLET | Freq: Three times a day (TID) | ORAL | 1 refills | Status: DC

## 2019-11-01 NOTE — Assessment & Plan Note (Signed)
Stable and well controlled hypertension.  BP is at goal < 130/80.  Pt is working on lifestyle modifications.  Taking medications tolerating well without side effects. Complications: diabetes, hyperlipidemia and GERD  Plan: 1. Continue taking lisinopril 20mg  daily 2. Obtain labs in the next 2 weeks 3. Encouraged heart healthy diet and increasing exercise to 30 minutes most days of the week, going no more than 2 days in a row without exercise. 4. Check BP 1-2 x per week at home, keep log, and bring to clinic at next appointment. 5. Follow up 3 months.

## 2019-11-01 NOTE — Assessment & Plan Note (Signed)
Reports long history of anxiety and that he believes he felt best when his previous psychiatry provided had given him alprazolam 2mg  PRN for anxiety.  Discussed options in clinic, including buspar and hydroxyzine.  Patient opting to try buspar for daily anxiety.  Plan: 1. Begin buspar 5mg  up to 3x per day as needed for anxiety 2. Mood handout given 3. Offered and declined referral to psychiatry for therapy 4. Follow up in 1 month

## 2019-11-01 NOTE — Patient Instructions (Signed)
Continue your medications as prescribed.  I have sent in a prescription for Buspar 5mg  to take 1 tablet up to 3x per day as needed for anxiety.  I have put in an order for a sleep study for your insomnia and daytime fatigue/sleepiness.  You should hear something from the sleep lab company within 1 week, if you do not, please let us know and I will reach out to them.  Try to get exercise a minimum of 30 minutes per day at least 5 days per week as well as  adequate water intake all while measuring blood pressure a few times per week.  Keep a blood pressure log and bring back to clinic at your next visit.  If your readings are consistently over 130/80 to contact our office/send me a MyChart message and we will see you sooner.  Can try DASH and Mediterranean diet options, avoiding processed foods, lowering sodium intake, avoiding pork products, and eating a plant based diet for optimal health.  Advice on Protecting Your Feet   1. Daily foot inspections - Look for any breaks in the skin or areas of irritation such as blisters or red areas. Report to primary doctor or foot nurse immediately if any problems occur.  - If you have vision problems and cannot see your feet well or it is hard for you to reach your feet, ask a family member to inspect your feet daily.   2. Daily foot hygiene  - Wash feet daily, but do not soak your feet in hot water. If you do have callus, you may use warm water epsom salt foot soak and use moisturizer after. - Dry well especially between toes; Pat dry do not rub.  - If your skin is dry use a lotion to moisturize but never between the toes.  - If your skin is wet from perspiration, use an antifungal foot powder daily.   3. Shoes and Socks  - Wear a clean pair of socks daily.  - Make sure your shoes fit well and that you are measured and properly fit each time you purchase shoes. Your shoe size may change.  - Powder your shoes with a small amount of an antifungal foot  powder daily, as the shoe is the only article of clothing that is not laundered.  - Wear appropriate shoes and socks for the weather. It is especially important to protect your feet from the cold; however, don't forget about sunscreen to the tops of your feet in the hot sun.  - Wear well fitting shoes rather than slippers or flip-flops when walking or standing for long period of time.  - When at home make sure you always have protective foot wear on your feet.   We will plan to see you back in 1 months for anxiety follow up  You will receive a survey after today's visit either digitally by e-mail or paper by [facility] mail. Your experiences and feedback matter to [place].  Please respond so we know how we are doing as we provide care for you.  Call us with any questions/concerns/needs.  It is my goal to be available to you for your health concerns.  Thanks for choosing me to be a partner in your healthcare needs!  Harlin Rain, FNP-C Family Nurse Practitioner [facility] Phone: [phone]

## 2019-11-01 NOTE — Assessment & Plan Note (Signed)
ControlledDM with A1c 7.3% Worsening control from 6.3% on 02/10/2019 and goal A1c < 7.0%. - Complications - peripheral neuropathy.  Plan:  1. Continue current therapy: metformin 1000mg  twice daily 2. Encourage improved lifestyle: - low carb/low glycemic diet reinforced prior education - Increase physical activity to 30 minutes most days of the week.  Explained that increased physical activity increases body's use of sugar for energy. 3. Check fasting am CBG and log these.  Bring log to next visit for review 4. Continue ACEi and Statin 5. Follow-up 3 months

## 2019-11-01 NOTE — Progress Notes (Signed)
Subjective:    Patient ID: [name], male    DOB: 14-May-1956, 64 y.o.   MRN: [id number]  [name] is a 64 y.o. male presenting on 11/01/2019 for Diabetes   HPI   Diabetes Pt presents today for follow up Type 2 Diabetes Mellitus.  He/she (caps): He ACTION; IS/IS NOT: is checking AM CBG at home with range of 135-150. -Current diabetic medications include: metformin 1000mg  twice daily -ACTION; IS/IS NOT: is not currently symptomatic -Actions; denies/reports/admits to: denies polydipsia, polyphagia, polyuria, headaches, diaphoresis, shakiness, chills, pain, numbness or tingling in extremities or changes in vision -Clinical course has been stable -Reports no structured exercise routine -Diet is high in salt, high in fat, and high in carbohydrates  PREVENTION Eye exam current (within 1 year) Yes Foot exam current (within 1 year) Yes Lipid/ASCVD risk reduction - on statin: YES/NO: Yes  Kidney Protection (On ACE/ARB)? YES/NO: Yes    Depression screen [facility] 2/9 08/01/2019 02/10/2019 11/08/2018  Decreased Interest 3 1 1   Down, Depressed, Hopeless 1 0 1  PHQ - 2 Score 4 1 2   Altered sleeping 2 3 0  Tired, decreased energy 3 3 2   Change in appetite 0 0 1  Feeling bad or failure about yourself  0 0 0  Trouble concentrating 3 1 0  Moving slowly or fidgety/restless 0 0 2  Suicidal thoughts 0 0 0  PHQ-9 Score 12 8 7   Difficult doing work/chores Somewhat difficult Not difficult at all Not difficult at all    Social History   Tobacco Use  . Smoking status: Former Smoker    Packs/day: 1.00    Years: 40.00    Pack years: 40.00    Types: Cigarettes    Quit date: 07/25/2018    Years since quitting: 1.2  . Smokeless tobacco: Former Network engineer Use Topics  . Alcohol use: Not Currently    Comment: quit 2003, drank about 5 beer per week prior  . Drug use: Never    Review of Systems  Constitutional: Negative.   HENT: Negative.   Eyes: Negative.   Respiratory:  Negative.   Cardiovascular: Negative.   Gastrointestinal: Negative.   Endocrine: Negative.   Genitourinary: Negative.   Musculoskeletal: Negative.   Skin: Negative.   Allergic/Immunologic: Negative.   Neurological: Negative.   Hematological: Negative.   Psychiatric/Behavioral: Negative.    Per HPI unless specifically indicated above     Objective:    BP 114/68 (BP Location: Left Arm, Patient Position: Sitting, Cuff Size: Normal)   Pulse 67   Temp (!) 97.5 F (36.4 C) (Temporal)   Ht 5\' 6"  (1.676 m)   Wt 157 lb (71.2 kg)   BMI 25.34 kg/m   Wt Readings from Last 3 Encounters:  11/01/19 157 lb (71.2 kg)  10/20/19 160 lb 0.9 oz (72.6 kg)  10/12/19 160 lb (72.6 kg)    Physical Exam Vitals reviewed.  Constitutional:      General: He is not in acute distress.    Appearance: Normal appearance. He is well-developed, well-groomed and overweight.  HENT:     Head: Normocephalic and atraumatic.     Nose:     Comments: [name] is in place, covering mouth and nose  Eyes:     General: Lids are normal. Vision grossly intact.     Pupils: Pupils are equal, round, and reactive to light.  Cardiovascular:     Rate and Rhythm: Normal rate and regular rhythm.     Pulses:  Radial pulses are 2+ on the right side and 2+ on the left side.       Posterior tibial pulses are 1+ on the right side and 1+ on the left side.     Heart sounds: Normal heart sounds, S1 normal and S2 normal.  Pulmonary:     Effort: Pulmonary effort is normal. No respiratory distress.     Breath sounds: Normal breath sounds and air entry.  Abdominal:     General: Bowel sounds are normal. There is no distension.     Palpations: Abdomen is soft.     Tenderness: There is no abdominal tenderness.     Hernia: No hernia is present.  Musculoskeletal:     Right lower leg: No edema.     Left lower leg: No edema.  Skin:    General: Skin is warm and dry.     Capillary Refill: Capillary refill takes less than 2  seconds.  Neurological:     Mental Status: He is alert and oriented to person, place, and time.     Cranial Nerves: No cranial nerve deficit.     Motor: No weakness or atrophy.  Psychiatric:        Attention and Perception: Attention and perception normal.        Mood and Affect: Mood and affect normal.        Speech: Speech normal.        Behavior: Behavior normal. Behavior is cooperative.        Thought Content: Thought content normal.        Cognition and Memory: Cognition and memory normal.        Judgment: Judgment normal.    Results for orders placed or performed in visit on 11/01/19  POCT glycosylated hemoglobin (Hb A1C)  Result Value Ref Range   Hemoglobin A1C 7.3 (A) 4.0 - 5.6 %      Assessment & Plan:   Problem List Items Addressed This Visit      Cardiovascular and Mediastinum   Essential hypertension    Stable and well controlled hypertension.  BP is at goal < 130/80.  Pt is working on lifestyle modifications.  Taking medications tolerating well without side effects. Complications: diabetes, hyperlipidemia and GERD  Plan: 1. Continue taking lisinopril 20mg  daily 2. Obtain labs in the next 2 weeks 3. Encouraged heart healthy diet and increasing exercise to 30 minutes most days of the week, going no more than 2 days in a row without exercise. 4. Check BP 1-2 x per week at home, keep log, and bring to clinic at next appointment. 5. Follow up 3 months.         Relevant Orders   CBC with Differential   COMPLETE METABOLIC PANEL WITH GFR     Endocrine   Type 2 diabetes mellitus with other specified complication ([place]) - Primary    ControlledDM with A1c 7.3% Worsening control from 6.3% on 02/10/2019 and goal A1c < 7.0%. - Complications - peripheral neuropathy.  Plan:  1. Continue current therapy: metformin 1000mg  twice daily 2. Encourage improved lifestyle: - low carb/low glycemic diet reinforced prior education - Increase physical activity to 30 minutes most  days of the week.  Explained that increased physical activity increases body's use of sugar for energy. 3. Check fasting am CBG and log these.  Bring log to next visit for review 4. Continue ACEi and Statin 5. Follow-up 3 months      Relevant Orders   POCT glycosylated hemoglobin (  Hb A1C) (Completed)   CBC with Differential   COMPLETE METABOLIC PANEL WITH GFR     Other   Hyperlipidemia   Relevant Orders   Lipid Profile   Anxiety    Reports long history of anxiety and that he believes he felt best when his previous psychiatry provided had given him alprazolam 2mg  PRN for anxiety.  Discussed options in clinic, including buspar and hydroxyzine.  Patient opting to try buspar for daily anxiety.  Plan: 1. Begin buspar 5mg  up to 3x per day as needed for anxiety 2. Mood handout given 3. Offered and declined referral to psychiatry for therapy 4. Follow up in 1 month      Relevant Medications   busPIRone (BUSPAR) 5 MG tablet    Other Visit Diagnoses    Weight gain       Relevant Orders   Thyroid Panel With TSH   Other fatigue       Relevant Orders   Nocturnal polysomnography (NPSG)      Meds ordered this encounter  Medications  . busPIRone (BUSPAR) 5 MG tablet    Sig: Take 1 tablet (5 mg total) by mouth 3 (three) times daily.    Dispense:  90 tablet    Refill:  1      Follow up plan: Return in about 4 weeks (around 11/29/2019) for Anxiety follow up.   Harlin Rain, [place] Family Nurse Practitioner [facility] 11/01/2019, 3:07 PM

## 2019-12-01 ENCOUNTER — Other Ambulatory Visit: Payer: Self-pay | Admitting: Family Medicine

## 2019-12-01 DIAGNOSIS — R0683 Snoring: Secondary | ICD-10-CM

## 2019-12-01 NOTE — Progress Notes (Signed)
Received notification that patient cannot do in lab study and would prefer home sleep test.  New order placed.

## 2019-12-06 DIAGNOSIS — G4733 Obstructive sleep apnea (adult) (pediatric): Secondary | ICD-10-CM | POA: Diagnosis not present

## 2019-12-06 DIAGNOSIS — R0602 Shortness of breath: Secondary | ICD-10-CM | POA: Diagnosis not present

## 2019-12-07 DIAGNOSIS — G4733 Obstructive sleep apnea (adult) (pediatric): Secondary | ICD-10-CM | POA: Diagnosis not present

## 2019-12-07 DIAGNOSIS — R0602 Shortness of breath: Secondary | ICD-10-CM | POA: Diagnosis not present

## 2019-12-13 ENCOUNTER — Encounter: Payer: Self-pay | Admitting: Family Medicine

## 2019-12-13 DIAGNOSIS — G4730 Sleep apnea, unspecified: Secondary | ICD-10-CM | POA: Insufficient documentation

## 2019-12-22 ENCOUNTER — Telehealth: Payer: Self-pay

## 2019-12-22 NOTE — Telephone Encounter (Signed)
Patient had colonoscopy on 10/20/2019 and patient bowel prep was not sufficient. Dr. Vicente Males wanted to repeat colonoscopy. Called and left a message for call back to rescheduled colonoscopy

## 2020-01-02 ENCOUNTER — Telehealth: Payer: Self-pay | Admitting: Family Medicine

## 2020-01-02 NOTE — Telephone Encounter (Signed)
Copied from [place] [id number]. Topic: General - Other >> Jan 02, 2020  1:15 PM Keene Breath wrote: Reason for CRM: Patient stated that he was sent the test for Sleep Apnea and he should have gotten the actual machine.  He would like the nurse to call him to discuss [phone].

## 2020-01-04 NOTE — Telephone Encounter (Signed)
Spoke with pt to inquire if he's ready to schedule repeat colonoscopy. Pt states he has too much going on right now so he'd like to hold off on the procedure. He plans to contact the office when he's ready to schedule.

## 2020-01-04 NOTE — Telephone Encounter (Signed)
I spoke with the patient and he informed me that he received another Sleep Study machine in the mail two weeks after doing a sleep study. He was expecting to receive the cpap machine in the mail. He said he sent the machine back to the company. I contacted [employer] and left a message for someone to call me so I can get some clarity on what is the status of his sleep study.

## 2020-01-10 ENCOUNTER — Ambulatory Visit (INDEPENDENT_AMBULATORY_CARE_PROVIDER_SITE_OTHER): Payer: Medicare Other | Admitting: Family Medicine

## 2020-01-10 ENCOUNTER — Other Ambulatory Visit: Payer: Self-pay

## 2020-01-10 ENCOUNTER — Encounter: Payer: Self-pay | Admitting: Family Medicine

## 2020-01-10 VITALS — BP 99/63 | HR 70 | Temp 98.3°F | Ht 66.0 in | Wt 155.4 lb

## 2020-01-10 DIAGNOSIS — L089 Local infection of the skin and subcutaneous tissue, unspecified: Secondary | ICD-10-CM | POA: Diagnosis not present

## 2020-01-10 MED ORDER — SULFAMETHOXAZOLE-TRIMETHOPRIM 800-160 MG PO TABS: 1.0000 | ORAL_TABLET | Freq: Two times a day (BID) | ORAL | 0 refills | Status: AC

## 2020-01-10 NOTE — Patient Instructions (Signed)
As we discussed, you have a skin infection from your wound on your scalp.  I have sent in a prescription for Bactrim DS to take 1 tablet 2x per day for the next 5 days.  Be sure to wash the wound twice per day with a mild soap, such as dial gold, and pat it dry.  We want the wound to be clean and dry.  Do not apply any additional topical ointments to this area.  We will plan to see you back in 4 weeks for follow up on your hypertension and diabetes  You will receive a survey after today's visit either digitally by e-mail or paper by [employer]. Your experiences and feedback matter to [place].  Please respond so we know how we are doing as we provide care for you.  Call us with any questions/concerns/needs.  It is my goal to be available to you for your health concerns.  Thanks for choosing me to be a partner in your healthcare needs!  Harlin Rain, FNP-C Family Nurse Practitioner [facility] Phone: [phone]

## 2020-01-10 NOTE — Progress Notes (Signed)
Subjective:    Patient ID: [name], male    DOB: 1956-04-05, 64 y.o.   MRN: [id number]  [name] is a 64 y.o. male presenting on 01/10/2020 for Skin Problem (The pt state he was helping his son remove some cabinets in his house and he hit the top of his head on some boards. He now have two sores in the top of his heads that  have scabe over but will not completely heal. x 2 mths ago )   HPI  [name] presents to clinic with concerns of delayed healing skin wound on the top of his head.  Reports he was helping his son remove some cabinets and he hit his head on some of the boards, has been using topical ointments and peroxide without improvement in his wound healing.  Has a history of diabetes, that he reports has been well controlled of recently.  Depression screen [facility] 2/9 08/01/2019 02/10/2019 11/08/2018  Decreased Interest 3 1 1   Down, Depressed, Hopeless 1 0 1  PHQ - 2 Score 4 1 2   Altered sleeping 2 3 0  Tired, decreased energy 3 3 2   Change in appetite 0 0 1  Feeling bad or failure about yourself  0 0 0  Trouble concentrating 3 1 0  Moving slowly or fidgety/restless 0 0 2  Suicidal thoughts 0 0 0  PHQ-9 Score 12 8 7   Difficult doing work/chores Somewhat difficult Not difficult at all Not difficult at all    Social History   Tobacco Use  . Smoking status: Former Smoker    Packs/day: 1.00    Years: 40.00    Pack years: 40.00    Types: Cigarettes    Quit date: 07/25/2018    Years since quitting: 1.4  . Smokeless tobacco: Former Network engineer  . Vaping Use: Never used  Substance Use Topics  . Alcohol use: Not Currently    Comment: quit 2003, drank about 5 beer per week prior  . Drug use: Never    Review of Systems  Constitutional: Negative.   HENT: Negative.   Eyes: Negative.   Respiratory: Negative.   Cardiovascular: Negative.   Gastrointestinal: Negative.   Endocrine: Negative.   Genitourinary: Negative.   Musculoskeletal: Negative.     Skin: Positive for wound.  Allergic/Immunologic: Negative.   Neurological: Negative.   Hematological: Negative.   Psychiatric/Behavioral: Negative.    Per HPI unless specifically indicated above     Objective:    BP 99/63 (BP Location: Right Arm, Patient Position: Sitting, Cuff Size: Normal)   Pulse 70   Temp 98.3 F (36.8 C) (Oral)   Ht 5\' 6"  (1.676 m)   Wt 155 lb 6.4 oz (70.5 kg)   BMI 25.08 kg/m   Wt Readings from Last 3 Encounters:  01/10/20 155 lb 6.4 oz (70.5 kg)  11/01/19 157 lb (71.2 kg)  10/20/19 160 lb 0.9 oz (72.6 kg)    Physical Exam Vitals reviewed.  Constitutional:      General: He is not in acute distress.    Appearance: Normal appearance. He is well-developed, well-groomed and overweight. He is not ill-appearing or toxic-appearing.  HENT:     Head: Normocephalic and atraumatic.     Nose:     Comments: [name] is in place, covering mouth and nose. Eyes:     General:        Right eye: No discharge.        Left eye: No  discharge.     Extraocular Movements: Extraocular movements intact.     Conjunctiva/sclera: Conjunctivae normal.     Pupils: Pupils are equal, round, and reactive to light.  Cardiovascular:     Rate and Rhythm: Normal rate and regular rhythm.     Pulses: Normal pulses.     Heart sounds: Normal heart sounds. No murmur heard.  No friction rub. No gallop.   Pulmonary:     Effort: Pulmonary effort is normal. No respiratory distress.     Breath sounds: Normal breath sounds.  Musculoskeletal:     Right lower leg: No edema.     Left lower leg: No edema.  Skin:    General: Skin is warm and dry.     Capillary Refill: Capillary refill takes less than 2 seconds.     Findings: Wound present.     Comments: Top of scalp with two wounds with scabbing and some redness around wound.  No active oozing or area of fluctuance.    Neurological:     General: No focal deficit present.     Mental Status: He is alert and oriented to person, place, and  time.  Psychiatric:        Attention and Perception: Attention and perception normal.        Mood and Affect: Mood and affect normal.        Speech: Speech normal.        Behavior: Behavior normal. Behavior is cooperative.        Thought Content: Thought content normal.        Cognition and Memory: Cognition and memory normal.    Results for orders placed or performed in visit on 11/01/19  POCT glycosylated hemoglobin (Hb A1C)  Result Value Ref Range   Hemoglobin A1C 7.3 (A) 4.0 - 5.6 %      Assessment & Plan:   Problem List Items Addressed This Visit      Musculoskeletal and Integument   Skin infection - Primary    Non-healing wound, reported happened approx 2 months ago when hitting head on cabinet.  Has tried OTC peroxide and other unnamed topical ointments.  Does have hx of diabetes, will cover with antibiotic due to slow wound healing.  To begin bactrim DS 1 tablet 2x per day for the next 5 days, to wash the wound twice per day with a mild soap, such as dial gold and to pat dry.  Avoid using any topical ointments.   Plan: 1. Begin bactrim DS, take 1 tablet 2x per day for the next 5 days 2. Wash wound 2x per day with a mild soap and pay dry.  Be sure to keep area clean and dry until fully heals 3. RTC if symptoms worsen or fail to improve with current treatment plan.      Relevant Medications   sulfamethoxazole-trimethoprim (BACTRIM DS) 800-160 MG tablet      Meds ordered this encounter  Medications  . sulfamethoxazole-trimethoprim (BACTRIM DS) 800-160 MG tablet    Sig: Take 1 tablet by mouth 2 (two) times daily for 5 days.    Dispense:  10 tablet    Refill:  0      Follow up plan: Return in about 4 weeks (around 02/07/2020) for DM, A1C, HTN F/U.   Harlin Rain, [place] Family Nurse Practitioner [facility] 01/10/2020, 11:52 AM

## 2020-01-10 NOTE — Assessment & Plan Note (Signed)
Non-healing wound, reported happened approx 2 months ago when hitting head on cabinet.  Has tried OTC peroxide and other unnamed topical ointments.  Does have hx of diabetes, will cover with antibiotic due to slow wound healing.  To begin bactrim DS 1 tablet 2x per day for the next 5 days, to wash the wound twice per day with a mild soap, such as dial gold and to pat dry.  Avoid using any topical ointments.   Plan: 1. Begin bactrim DS, take 1 tablet 2x per day for the next 5 days 2. Wash wound 2x per day with a mild soap and pay dry.  Be sure to keep area clean and dry until fully heals 3. RTC if symptoms worsen or fail to improve with current treatment plan.

## 2020-02-07 ENCOUNTER — Encounter: Payer: Self-pay | Admitting: Family Medicine

## 2020-02-07 ENCOUNTER — Ambulatory Visit (INDEPENDENT_AMBULATORY_CARE_PROVIDER_SITE_OTHER): Payer: Medicare Other | Admitting: Family Medicine

## 2020-02-07 ENCOUNTER — Other Ambulatory Visit: Payer: Self-pay

## 2020-02-07 VITALS — BP 122/71 | HR 79 | Temp 98.4°F | Resp 18 | Ht 66.0 in | Wt 158.2 lb

## 2020-02-07 DIAGNOSIS — Z79899 Other long term (current) drug therapy: Secondary | ICD-10-CM

## 2020-02-07 DIAGNOSIS — E785 Hyperlipidemia, unspecified: Secondary | ICD-10-CM | POA: Diagnosis not present

## 2020-02-07 DIAGNOSIS — I10 Essential (primary) hypertension: Secondary | ICD-10-CM | POA: Diagnosis not present

## 2020-02-07 DIAGNOSIS — E1169 Type 2 diabetes mellitus with other specified complication: Secondary | ICD-10-CM | POA: Diagnosis not present

## 2020-02-07 DIAGNOSIS — K219 Gastro-esophageal reflux disease without esophagitis: Secondary | ICD-10-CM

## 2020-02-07 DIAGNOSIS — F5101 Primary insomnia: Secondary | ICD-10-CM

## 2020-02-07 DIAGNOSIS — L089 Local infection of the skin and subcutaneous tissue, unspecified: Secondary | ICD-10-CM | POA: Diagnosis not present

## 2020-02-07 LAB — POCT GLYCOSYLATED HEMOGLOBIN (HGB A1C): Hemoglobin A1C: 6.5 % — AB (ref 4.0–5.6)

## 2020-02-07 MED ORDER — CEPHALEXIN 500 MG PO CAPS: 500.0000 mg | ORAL_CAPSULE | Freq: Four times a day (QID) | ORAL | 0 refills | Status: AC

## 2020-02-07 NOTE — Assessment & Plan Note (Signed)
Status unknown.  Recheck labs.  Continue meds without changes today.  Refills provided. Followup after labs.  

## 2020-02-07 NOTE — Assessment & Plan Note (Signed)
Non healing wound on scalp x 2.5-3 months ago.  Has tried 5 day course of antibiotics (Bactrim DS) with some improvement but not full resolution of symptoms.  Will start on cephalexin x 5 days and if no improvement in symptoms, will refer to wound clinic for assistance with management.  Encouraged to avoid topical ointments.  Plan: 1. Begin cephalexin 500mg , 1 tablet 4x per day for the next 5 days 2. Continue to wash wound with mild soap, such as Dial Gold, 2x per day and pat dry, leave open to air 3. RTC if symptoms worsen or fail to improve with current treatment

## 2020-02-07 NOTE — Progress Notes (Signed)
Subjective:    Patient ID: [name], male    DOB: 1955/09/18, 64 y.o.   MRN: [id number]  Ramel Tobon is a 64 y.o. male presenting on 02/07/2020 for Diabetes (wound on the top of the head. Non-healing wound, reported happened approx 2 months ago when hitting head on cabinet.  Has tried OTC peroxide and other unnamed topical ointments.  Does have hx of diabetes, will cover with antibiotic due to slow wound healing) and Hypertension   HPI   Diabetes Pt presents today for follow up Type 2 Diabetes Mellitus.  He/she (caps): He ACTION; IS/IS NOT: is not checking AM CBG at home. -Current diabetic medications include: metformin 1000mg  BID WC -ACTION; IS/IS NOT: is not currently symptomatic -Actions; denies/reports/admits to: denies polydipsia, polyphagia, polyuria, headaches, diaphoresis, shakiness, chills, pain, numbness or tingling in extremities or changes in vision -Clinical course has been improving  -Reports no exercise routine -Diet is high in salt, high in fat, and high in carbohydrates  PREVENTION Eye exam current (within 1 year) Due, encouraged to schedule Foot exam current (within 1 year) Up to date Lipid/ASCVD risk reduction - on statin: YES/NO: Yes  Kidney Protection (On ACE/ARB)? YES/NO: Yes   Hypertension - He is not checking BP at home or outside of clinic.    - Current medications: lisinopril 20mg  daily and hydrochlorothiazide 12.5mg  daily, tolerating well without side effects - He is not currently symptomatic. - Pt denies headache, lightheadedness, dizziness, changes in vision, chest tightness/pressure, palpitations, leg swelling, sudden loss of speech or loss of consciousness. - He  reports no regular exercise routine. - His diet is high in salt, high in fat, and high in carbohydrates.  He has concerns for continued wound on top of scalp that has been having delayed healing.  Depression screen [facility] 2/9 08/01/2019 02/10/2019 11/08/2018  Decreased Interest 3  1 1   Down, Depressed, Hopeless 1 0 1  PHQ - 2 Score 4 1 2   Altered sleeping 2 3 0  Tired, decreased energy 3 3 2   Change in appetite 0 0 1  Feeling bad or failure about yourself  0 0 0  Trouble concentrating 3 1 0  Moving slowly or fidgety/restless 0 0 2  Suicidal thoughts 0 0 0  PHQ-9 Score 12 8 7   Difficult doing work/chores Somewhat difficult Not difficult at all Not difficult at all    Social History   Tobacco Use  . Smoking status: Former Smoker    Packs/day: 1.00    Years: 40.00    Pack years: 40.00    Types: Cigarettes    Quit date: 07/25/2018    Years since quitting: 1.5  . Smokeless tobacco: Former Network engineer  . Vaping Use: Never used  Substance Use Topics  . Alcohol use: Not Currently    Comment: quit 2003, drank about 5 beer per week prior  . Drug use: Never    Review of Systems  Constitutional: Negative.   HENT: Negative.   Eyes: Negative.   Respiratory: Negative.   Cardiovascular: Negative.   Gastrointestinal: Negative.   Endocrine: Negative.   Genitourinary: Negative.   Musculoskeletal: Negative.   Skin: Positive for wound.  Allergic/Immunologic: Negative.   Neurological: Negative.   Hematological: Negative.   Psychiatric/Behavioral: Negative.    Per HPI unless specifically indicated above     Objective:    BP 122/71 (BP Location: Right Arm, Patient Position: Sitting, Cuff Size: Normal)   Pulse 79   Temp 98.4 F (  36.9 C) (Oral)   Resp 18   Ht 5\' 6"  (1.676 m)   Wt 158 lb 3.2 oz (71.8 kg)   SpO2 98%   BMI 25.53 kg/m   Wt Readings from Last 3 Encounters:  02/07/20 158 lb 3.2 oz (71.8 kg)  01/10/20 155 lb 6.4 oz (70.5 kg)  11/01/19 157 lb (71.2 kg)    Physical Exam Vitals reviewed.  Constitutional:      General: He is not in acute distress.    Appearance: Normal appearance. He is well-developed, well-groomed and overweight. He is not ill-appearing or toxic-appearing.  HENT:     Head: Normocephalic and atraumatic.     Nose:      Comments: [name] is in place, covering mouth and nose. Eyes:     General:        Right eye: No discharge.        Left eye: No discharge.     Extraocular Movements: Extraocular movements intact.     Conjunctiva/sclera: Conjunctivae normal.     Pupils: Pupils are equal, round, and reactive to light.  Cardiovascular:     Rate and Rhythm: Normal rate and regular rhythm.     Pulses: Normal pulses.     Heart sounds: Normal heart sounds. No murmur heard.  No friction rub. No gallop.   Pulmonary:     Effort: Pulmonary effort is normal. No respiratory distress.     Breath sounds: Normal breath sounds.  Musculoskeletal:     Right lower leg: No edema.     Left lower leg: No edema.  Skin:    General: Skin is warm and dry.     Capillary Refill: Capillary refill takes less than 2 seconds.  Neurological:     General: No focal deficit present.     Mental Status: He is alert and oriented to person, place, and time.  Psychiatric:        Attention and Perception: Attention and perception normal.        Mood and Affect: Mood and affect normal.        Speech: Speech normal.        Behavior: Behavior normal. Behavior is cooperative.        Thought Content: Thought content normal.        Cognition and Memory: Cognition and memory normal.    Results for orders placed or performed in visit on 02/07/20  POCT glycosylated hemoglobin (Hb A1C)  Result Value Ref Range   Hemoglobin A1C 6.5 (A) 4.0 - 5.6 %   HbA1c POC (<> result, manual entry)     HbA1c, POC (prediabetic range)     HbA1c, POC (controlled diabetic range)        Assessment & Plan:   Problem List Items Addressed This Visit      Cardiovascular and Mediastinum   Essential hypertension    Controlled hypertension.  BP is at goal < 130/80.  Pt is working on lifestyle modifications.  Taking medications tolerating well without side effects.  Complications:  Overweight, COPD, OSA, GERD, T2DM, HLD  Plan: 1. Continue taking lisinopril 20mg   daily and hydrochlorothiazide 12.5mg  daily 2. Obtain labs in 1-2 weeks  3. Encouraged heart healthy diet and increasing exercise to 30 minutes most days of the week, going no more than 2 days in a row without exercise. 4. Check BP 1-2 x per week at home, keep log, and bring to clinic at next appointment. 5. Follow up 3 months.  Relevant Orders   CBC with Differential   COMPLETE METABOLIC PANEL WITH GFR     Digestive   Gastroesophageal reflux disease    Currently well controlled on omeprazolemg 20mg  once daily.  Plan: 1. Continue omeprazole 20mg  once daily. Side effects discussed. Pt wants to continue med. 2. Avoid diet triggers. Reviewed need to seek care if globus sensation, difficulty swallowing, s/sx of GI bleed. 3. Follow up as needed and in 6 months.         Endocrine   Type 2 diabetes mellitus with other specified complication ([place]) - Primary    Well-controlledDM with A1c 6.5% improved from 7.3% on 11/01/2019 and goal A1c < 7.0%. - Complications - peripheral neuropathy.  Plan:  1. Continue current therapy: metformin 1000mg  twice daily with meals 2. Encourage improved lifestyle: - low carb/low glycemic diet reinforced prior education - Increase physical activity to 30 minutes most days of the week.  Explained that increased physical activity increases body's use of sugar for energy. 3. Check fasting am CBG and log these.  Bring log to next visit for review 4. Continue ACEi and Statin 5. Advised to schedule DM ophtho exam, send record. 6. Follow-up 3 months       Relevant Orders   POCT glycosylated hemoglobin (Hb A1C) (Completed)   CBC with Differential   COMPLETE METABOLIC PANEL WITH GFR     Musculoskeletal and Integument   Skin infection    Non healing wound on scalp x 2.5-3 months ago.  Has tried 5 day course of antibiotics (Bactrim DS) with some improvement but not full resolution of symptoms.  Will start on cephalexin x 5 days and if no improvement in  symptoms, will refer to wound clinic for assistance with management.  Encouraged to avoid topical ointments.  Plan: 1. Begin cephalexin 500mg , 1 tablet 4x per day for the next 5 days 2. Continue to wash wound with mild soap, such as Dial Gold, 2x per day and pat dry, leave open to air 3. RTC if symptoms worsen or fail to improve with current treatment      Relevant Medications   cephALEXin (KEFLEX) 500 MG capsule     Other   Primary insomnia    Reports is mainly using melatonin for sleep, with good results.  If he is having a more difficult time getting to sleep will take 50-100mg  of trazodone as needed, but has not been using this in a few weeks.  Plan: 1. Continue melatonin nightly and trazodone 50-100mg  at night as needed 2. RTC in 6 months, sooner, if needed      Hyperlipidemia    Status unknown.  Recheck labs.  Continue meds without changes today.  Refills provided. Followup after labs.       Relevant Orders   Lipid Profile    Other Visit Diagnoses    Long-term use of high-risk medication       Relevant Orders   Thyroid Panel With TSH      Meds ordered this encounter  Medications  . cephALEXin (KEFLEX) 500 MG capsule    Sig: Take 1 capsule (500 mg total) by mouth 4 (four) times daily for 5 days.    Dispense:  20 capsule    Refill:  0   Follow up plan: Return in about 3 months (around 05/08/2020) for HTN & DM, A1C F/U.   Harlin Rain, [place] Family Nurse Practitioner [facility] 02/07/2020, 3:52 PM

## 2020-02-07 NOTE — Patient Instructions (Signed)
Have your labs drawn in the next 1-2 weeks and we will contact you with the results.  Continue all of your medications as prescribed.  I have sent in a prescription for cephalexin 500mg  to take 1 tablet 4x per day for the next 5 days for your skin infection.  You can learn more information online about your diabetes at American Diabetes Association: http://www.diabetes.org/ - General self-care (diet, medications, blood sugar checks). - Diet recommendations - There are even recipes available for you to look at and try.  Try to get exercise a minimum of 30 minutes per day at least 5 days per week as well as  adequate water intake all while measuring blood pressure a few times per week.  Keep a blood pressure log and bring back to clinic at your next visit.  If your readings are consistently over 130/80 to contact our office/send me a MyChart message and we will see you sooner.  Can try DASH and Mediterranean diet options, avoiding processed foods, lowering sodium intake, avoiding pork products, and eating a plant based diet for optimal health.  We will plan to see you back in 3 months for diabetes and hypertension follow up and we will see you back as needed if your skin infection on top of your head does not respond to the antibiotics.  You will receive a survey after today's visit either digitally by e-mail or paper by [employer]. Your experiences and feedback matter to [place].  Please respond so we know how we are doing as we provide care for you.  Call us with any questions/concerns/needs.  It is my goal to be available to you for your health concerns.  Thanks for choosing me to be a partner in your healthcare needs!  Harlin Rain, FNP-C Family Nurse Practitioner [facility] Phone: [phone]

## 2020-02-07 NOTE — Assessment & Plan Note (Signed)
Currently well controlled on omeprazolemg 20mg  once daily.  Plan: 1. Continue omeprazole 20mg  once daily. Side effects discussed. Pt wants to continue med. 2. Avoid diet triggers. Reviewed need to seek care if globus sensation, difficulty swallowing, s/sx of GI bleed. 3. Follow up as needed and in 6 months.

## 2020-02-07 NOTE — Assessment & Plan Note (Signed)
Well-controlledDM with A1c 6.5% improved from 7.3% on 11/01/2019 and goal A1c < 7.0%. - Complications - peripheral neuropathy.  Plan:  1. Continue current therapy: metformin 1000mg  twice daily with meals 2. Encourage improved lifestyle: - low carb/low glycemic diet reinforced prior education - Increase physical activity to 30 minutes most days of the week.  Explained that increased physical activity increases body's use of sugar for energy. 3. Check fasting am CBG and log these.  Bring log to next visit for review 4. Continue ACEi and Statin 5. Advised to schedule DM ophtho exam, send record. 6. Follow-up 3 months

## 2020-02-07 NOTE — Assessment & Plan Note (Signed)
Controlled hypertension.  BP is at goal < 130/80.  Pt is working on lifestyle modifications.  Taking medications tolerating well without side effects.  Complications:  Overweight, COPD, OSA, GERD, T2DM, HLD  Plan: 1. Continue taking lisinopril 20mg  daily and hydrochlorothiazide 12.5mg  daily 2. Obtain labs in 1-2 weeks  3. Encouraged heart healthy diet and increasing exercise to 30 minutes most days of the week, going no more than 2 days in a row without exercise. 4. Check BP 1-2 x per week at home, keep log, and bring to clinic at next appointment. 5. Follow up 3 months.

## 2020-02-07 NOTE — Assessment & Plan Note (Signed)
Reports is mainly using melatonin for sleep, with good results.  If he is having a more difficult time getting to sleep will take 50-100mg  of trazodone as needed, but has not been using this in a few weeks.  Plan: 1. Continue melatonin nightly and trazodone 50-100mg  at night as needed 2. RTC in 6 months, sooner, if needed

## 2020-02-15 ENCOUNTER — Other Ambulatory Visit: Payer: Medicare Other

## 2020-02-16 ENCOUNTER — Ambulatory Visit: Payer: Medicare Other

## 2020-02-16 ENCOUNTER — Other Ambulatory Visit: Payer: Self-pay

## 2020-02-16 ENCOUNTER — Ambulatory Visit (INDEPENDENT_AMBULATORY_CARE_PROVIDER_SITE_OTHER): Payer: Medicare Other

## 2020-02-16 ENCOUNTER — Other Ambulatory Visit: Payer: Medicare Other

## 2020-02-16 DIAGNOSIS — E785 Hyperlipidemia, unspecified: Secondary | ICD-10-CM | POA: Diagnosis not present

## 2020-02-16 DIAGNOSIS — I10 Essential (primary) hypertension: Secondary | ICD-10-CM | POA: Diagnosis not present

## 2020-02-16 DIAGNOSIS — E1169 Type 2 diabetes mellitus with other specified complication: Secondary | ICD-10-CM | POA: Diagnosis not present

## 2020-02-16 DIAGNOSIS — Z23 Encounter for immunization: Secondary | ICD-10-CM | POA: Diagnosis not present

## 2020-02-16 DIAGNOSIS — Z79899 Other long term (current) drug therapy: Secondary | ICD-10-CM | POA: Diagnosis not present

## 2020-02-17 LAB — COMPLETE METABOLIC PANEL WITH GFR
AG Ratio: 1.6 (calc) (ref 1.0–2.5)
ALT: 29 U/L (ref 9–46)
AST: 23 U/L (ref 10–35)
Albumin: 4.5 g/dL (ref 3.6–5.1)
Alkaline phosphatase (APISO): 65 U/L (ref 35–144)
BUN: 22 mg/dL (ref 7–25)
CO2: 27 mmol/L (ref 20–32)
Calcium: 9.3 mg/dL (ref 8.6–10.3)
Chloride: 101 mmol/L (ref 98–110)
Creat: 1.23 mg/dL (ref 0.70–1.25)
GFR, Est African American: 70 mL/min/{1.73_m2} (ref >=60)
GFR, Est Non African American: 61 mL/min/{1.73_m2} (ref >=60)
Globulin: 2.9 g/dL (calc) (ref 1.9–3.7)
Glucose, Bld: 130 mg/dL — ABNORMAL HIGH (ref 65–99)
Potassium: 4.7 mmol/L (ref 3.5–5.3)
Sodium: 137 mmol/L (ref 135–146)
Total Bilirubin: 0.7 mg/dL (ref 0.2–1.2)
Total Protein: 7.4 g/dL (ref 6.1–8.1)

## 2020-02-17 LAB — CBC WITH DIFFERENTIAL/PLATELET
Absolute Monocytes: 437 cells/uL (ref 200–950)
Basophils Absolute: 30 cells/uL (ref 0–200)
Basophils Relative: 0.5 %
Eosinophils Absolute: 207 cells/uL (ref 15–500)
Eosinophils Relative: 3.5 %
HCT: 39.2 % (ref 38.5–50.0)
Hemoglobin: 13 g/dL — ABNORMAL LOW (ref 13.2–17.1)
Lymphs Abs: 1864 cells/uL (ref 850–3900)
MCH: 30.6 pg (ref 27.0–33.0)
MCHC: 33.2 g/dL (ref 32.0–36.0)
MCV: 92.2 fL (ref 80.0–100.0)
MPV: 10 fL (ref 7.5–12.5)
Monocytes Relative: 7.4 %
Neutro Abs: 3363 cells/uL (ref 1500–7800)
Neutrophils Relative %: 57 %
Platelets: 184 10*3/uL (ref 140–400)
RBC: 4.25 10*6/uL (ref 4.20–5.80)
RDW: 13.2 % (ref 11.0–15.0)
Total Lymphocyte: 31.6 %
WBC: 5.9 10*3/uL (ref 3.8–10.8)

## 2020-02-17 LAB — THYROID PANEL WITH TSH
Free Thyroxine Index: 2.2 (ref 1.4–3.8)
T3 Uptake: 33 % (ref 22–35)
T4, Total: 6.7 ug/dL (ref 4.9–10.5)
TSH: 1.98 mIU/L (ref 0.40–4.50)

## 2020-02-17 LAB — LIPID PANEL
Cholesterol: 109 mg/dL (ref <200)
HDL: 35 mg/dL — ABNORMAL LOW (ref >=40)
LDL Cholesterol (Calc): 45 mg/dL (calc)
Non-HDL Cholesterol (Calc): 74 mg/dL (calc) (ref <130)
Total CHOL/HDL Ratio: 3.1 (calc) (ref <5.0)
Triglycerides: 233 mg/dL — ABNORMAL HIGH (ref <150)

## 2020-02-22 DIAGNOSIS — E119 Type 2 diabetes mellitus without complications: Secondary | ICD-10-CM | POA: Diagnosis not present

## 2020-02-22 DIAGNOSIS — H2513 Age-related nuclear cataract, bilateral: Secondary | ICD-10-CM | POA: Diagnosis not present

## 2020-02-22 DIAGNOSIS — I10 Essential (primary) hypertension: Secondary | ICD-10-CM | POA: Diagnosis not present

## 2020-02-22 LAB — HM DIABETES EYE EXAM

## 2020-03-01 ENCOUNTER — Telehealth: Payer: Self-pay

## 2020-03-01 ENCOUNTER — Other Ambulatory Visit: Payer: Self-pay | Admitting: Family Medicine

## 2020-03-01 DIAGNOSIS — T148XXA Other injury of unspecified body region, initial encounter: Secondary | ICD-10-CM

## 2020-03-01 NOTE — Telephone Encounter (Signed)
I put in a referral to the wound center with [facility].  They should be contacting him within the week to schedule an appointment to be seen.  We would not put on another antibiotic, we have tried 2 different ones without improvement.  TY

## 2020-03-01 NOTE — Telephone Encounter (Signed)
Copied from [facility] [phone]. Topic: General - Other >> Mar 01, 2020 11:49 AM Rainey Pines A wrote: Patient would like a callback from [facility] in regards to wanting to know what he needs to do about the spot on his head not healing that he was recently seen for and wants to know if he needs an antibiotic. Please advise

## 2020-03-02 ENCOUNTER — Telehealth: Payer: Self-pay

## 2020-03-02 NOTE — Telephone Encounter (Signed)
Sent request to referral coordinator, will be resending referral to [facility].

## 2020-03-02 NOTE — Telephone Encounter (Signed)
Copied from [place] [facility] [id number]. Topic: General - Other >> Mar 02, 2020  9:10 AM Yvette Rack wrote: Reason for CRM: Freda Munro with [facility] stated they are not accepting any new patients at this time due to staffing. Freda Munro provided contact number for other locations: [facility] [phone], [address] [place]) [phone], [facility] [phone], and [facility]

## 2020-03-06 ENCOUNTER — Telehealth: Payer: Self-pay | Admitting: Family Medicine

## 2020-03-06 DIAGNOSIS — L089 Local infection of the skin and subcutaneous tissue, unspecified: Secondary | ICD-10-CM

## 2020-03-06 NOTE — Telephone Encounter (Signed)
CCM referral placed to request assistance with transportation for his wound care appt to [facility].  Patient unable to schedule due to not having transportation currently.

## 2020-03-07 ENCOUNTER — Telehealth: Payer: Self-pay

## 2020-03-07 NOTE — Telephone Encounter (Signed)
    MA10/13/2021 1st Attempt  Name: [name]   MRN: [id number]   DOB: 1956-01-02   AGE: 64 y.o.   GENDER: male   PCP Verl Bangs, FNP.   03/07/20 Spoke with patient about [employer] and [employer].  Will follow-up with patient in the next 7 days.    Ottilie Wigglesworth, AAS Paralegal, [place] . Embedded Care Coordination [facility]  Care Management  [address], [place] [address] [email or web address]  [phone]   [email or web address]

## 2020-03-15 ENCOUNTER — Telehealth: Payer: Self-pay

## 2020-03-15 NOTE — Telephone Encounter (Signed)
    MA10/21/2021   Name: [name]   MRN: [id number]   DOB: [birth year]   AGE: 64 y.o.   GENDER: male   PCP Verl Bangs, FNP.   03/15/20 Spoke with patient he plans on calling either [facility] or [employer] he is aware the he needs to give both at least three days notice before his appointments. No other resources are needed at this time. Closing referral.    Bostyn Bogie, AAS Paralegal, [place] . Embedded Care Coordination [facility] Health  Care Management  [address], [place] [address] [email or web address]  [phone]   [email or web address]

## 2020-03-23 DIAGNOSIS — G4733 Obstructive sleep apnea (adult) (pediatric): Secondary | ICD-10-CM | POA: Diagnosis not present

## 2020-03-26 ENCOUNTER — Telehealth: Payer: Self-pay

## 2020-03-26 NOTE — Telephone Encounter (Signed)
-----   Message from Verl Bangs, FNP sent at 03/06/2020  7:50 AM EDT ----- Regarding: Wound Care Referral Patient declined wound care referral to [facility] bc he does not have transportation there.  I am going to request with social work if they can assist that.  Can you contact him and find out if he is willing to go if we can help with transportation?

## 2020-03-26 NOTE — Telephone Encounter (Signed)
Attempted to contact the patient to find out if he was interested in transportation. No answer, lmom to return my call.

## 2020-03-26 NOTE — Telephone Encounter (Signed)
Pt called back to say Yes he does want referral.  They wound center in [place] has already called him and set appt. He has called [facility] and they are going to provide his transportation.

## 2020-04-25 ENCOUNTER — Ambulatory Visit: Payer: Medicare Other | Admitting: Internal Medicine

## 2020-05-01 ENCOUNTER — Other Ambulatory Visit: Payer: Self-pay | Admitting: Nurse Practitioner

## 2020-05-01 ENCOUNTER — Other Ambulatory Visit: Payer: Self-pay | Admitting: Family Medicine

## 2020-05-01 DIAGNOSIS — K219 Gastro-esophageal reflux disease without esophagitis: Secondary | ICD-10-CM

## 2020-05-01 DIAGNOSIS — F5101 Primary insomnia: Secondary | ICD-10-CM

## 2020-05-01 DIAGNOSIS — E785 Hyperlipidemia, unspecified: Secondary | ICD-10-CM

## 2020-05-01 DIAGNOSIS — F419 Anxiety disorder, unspecified: Secondary | ICD-10-CM

## 2020-05-01 DIAGNOSIS — E1136 Type 2 diabetes mellitus with diabetic cataract: Secondary | ICD-10-CM

## 2020-05-01 DIAGNOSIS — I10 Essential (primary) hypertension: Secondary | ICD-10-CM

## 2020-05-01 DIAGNOSIS — E1169 Type 2 diabetes mellitus with other specified complication: Secondary | ICD-10-CM

## 2020-05-01 NOTE — Telephone Encounter (Signed)
Requested medication (s) are due for refill today: unsure  Requested medication (s) are on the active medication list: No  Last refill:  expired 12/01/19 not on active list  Future visit scheduled: 1 week  Notes to clinic:  Med is not on active list It has expired with end date 12/01/19. Is patient to be taking this medication ? Please review.    Requested Prescriptions  Pending Prescriptions Disp Refills   busPIRone (BUSPAR) 5 MG tablet [Pharmacy Med Name: BUSPIRONE HCL 5 MG TAB] 90 tablet 1    Sig: TAKE 1 TABLET BY MOUTH 3 TIMES DAILY      Psychiatry: Anxiolytics/Hypnotics - Non-controlled Passed - 05/01/2020 11:49 AM      Passed - Valid encounter within last 6 months    Recent Outpatient Visits           2 months ago Type 2 diabetes mellitus with other specified complication, without long-term current use of insulin ([place])   [place], FNP   3 months ago Skin infection   [facility] Lupita Raider, FNP   6 months ago Type 2 diabetes mellitus with other specified complication, without long-term current use of insulin [facility])   [facility], Lupita Raider, FNP   8 months ago Type 2 diabetes mellitus with other specified complication, without long-term current use of insulin [facility])   [facility], Lupita Raider, FNP   9 months ago Screening for AAA (abdominal aortic aneurysm)   [facility], Lupita Raider, FNP       Future Appointments             In 1 week [place], Lupita Raider, [facility], [facility]

## 2020-05-03 ENCOUNTER — Encounter: Payer: Self-pay | Admitting: Family Medicine

## 2020-05-03 ENCOUNTER — Ambulatory Visit (INDEPENDENT_AMBULATORY_CARE_PROVIDER_SITE_OTHER): Payer: Medicare Other | Admitting: Family Medicine

## 2020-05-03 ENCOUNTER — Other Ambulatory Visit: Payer: Self-pay

## 2020-05-03 VITALS — BP 111/81 | HR 96 | Resp 17 | Ht 66.0 in | Wt 161.2 lb

## 2020-05-03 DIAGNOSIS — F5101 Primary insomnia: Secondary | ICD-10-CM

## 2020-05-03 DIAGNOSIS — I10 Essential (primary) hypertension: Secondary | ICD-10-CM

## 2020-05-03 DIAGNOSIS — Z23 Encounter for immunization: Secondary | ICD-10-CM

## 2020-05-03 DIAGNOSIS — E785 Hyperlipidemia, unspecified: Secondary | ICD-10-CM

## 2020-05-03 DIAGNOSIS — K219 Gastro-esophageal reflux disease without esophagitis: Secondary | ICD-10-CM

## 2020-05-03 DIAGNOSIS — E1169 Type 2 diabetes mellitus with other specified complication: Secondary | ICD-10-CM

## 2020-05-03 DIAGNOSIS — J449 Chronic obstructive pulmonary disease, unspecified: Secondary | ICD-10-CM | POA: Insufficient documentation

## 2020-05-03 DIAGNOSIS — F419 Anxiety disorder, unspecified: Secondary | ICD-10-CM | POA: Diagnosis not present

## 2020-05-03 LAB — POCT GLYCOSYLATED HEMOGLOBIN (HGB A1C): Hemoglobin A1C: 7.1 % — AB (ref 4.0–5.6)

## 2020-05-03 MED ORDER — METFORMIN HCL 1000 MG PO TABS: ORAL_TABLET | ORAL | 1 refills | Status: DC

## 2020-05-03 MED ORDER — ATORVASTATIN CALCIUM 40 MG PO TABS: ORAL_TABLET | ORAL | 1 refills | Status: DC

## 2020-05-03 MED ORDER — ALBUTEROL SULFATE (2.5 MG/3ML) 0.083% IN NEBU: 2.5000 mg | INHALATION_SOLUTION | Freq: Four times a day (QID) | RESPIRATORY_TRACT | 1 refills | Status: AC | PRN

## 2020-05-03 MED ORDER — OMEPRAZOLE 20 MG PO CPDR: 20.0000 mg | DELAYED_RELEASE_CAPSULE | Freq: Every day | ORAL | 1 refills | Status: DC

## 2020-05-03 MED ORDER — HYDROCHLOROTHIAZIDE 12.5 MG PO CAPS: ORAL_CAPSULE | ORAL | 1 refills | Status: DC

## 2020-05-03 MED ORDER — BUSPIRONE HCL 5 MG PO TABS: 5.0000 mg | ORAL_TABLET | Freq: Three times a day (TID) | ORAL | 1 refills | Status: DC

## 2020-05-03 MED ORDER — TRAZODONE HCL 100 MG PO TABS: 50.0000 mg | ORAL_TABLET | Freq: Every evening | ORAL | 1 refills | Status: DC | PRN

## 2020-05-03 MED ORDER — LISINOPRIL 20 MG PO TABS: 20.0000 mg | ORAL_TABLET | Freq: Every day | ORAL | 1 refills | Status: DC

## 2020-05-03 NOTE — Assessment & Plan Note (Signed)
Controlled hypertension.  BP is at goal < 130/80.  Pt reports working on lifestyle modifications.  Taking medications tolerating well without side effects.  Complications:  Overweight, COPD, OSA, GERD, T2DM, HLD  Plan: 1. Continue taking lisinopril 20mg  and hydrochlorothiazide 12.5mg  daily 2. Obtain labs at next visit  3. Encouraged heart healthy diet and increasing exercise to 30 minutes most days of the week, going no more than 2 days in a row without exercise. 4. Check BP 1-2 x per week at home, keep log, and bring to clinic at next appointment. 5. Follow up 6 months.

## 2020-05-03 NOTE — Patient Instructions (Signed)
Continue your medications as prescribed.  We have given you your pneumonia vaccine today.  You can learn more information online about your diabetes at American Diabetes Association: http://www.diabetes.org/ - General self-care (diet, medications, blood sugar checks). - Diet recommendations - There are even recipes available for you to look at and try.  We will plan to see you back in 6 months for diabetes follow up visit  You will receive a survey after today's visit either digitally by e-mail or paper by [place] mail. Your experiences and feedback matter to [place].  Please respond so we know how we are doing as we provide care for you.  Call us with any questions/concerns/needs.  It is my goal to be available to you for your health concerns.  Thanks for choosing me to be a partner in your healthcare needs!  Harlin Rain, FNP-C Family Nurse Practitioner [facility] Phone: [phone]

## 2020-05-03 NOTE — Assessment & Plan Note (Signed)
Reports stable and well controlled, requesting refill on his albuterol so he will have if he needs.  Refill send to pharmacy on file.

## 2020-05-03 NOTE — Assessment & Plan Note (Signed)
ControlledDM with A1c 7.1% Worsening control from 6.5% on 02/07/2020 and goal A1c < 7.0%. - Complications - peripheral neuropathy, HTN, HLD, Overweight, GERD  Plan:  1. Continue current therapy: metformin 1000mg  BID WC 2. Encourage improved lifestyle: - low carb/low glycemic diet reinforced prior education - Increase physical activity to 30 minutes most days of the week.  Explained that increased physical activity increases body's use of sugar for energy. 3. Check fasting am CBG and log these.  Bring log to next visit for review 4. Continue ACEi and Statin 5. Up to date on DM eye and foot exam 6. Follow-up 6 months

## 2020-05-03 NOTE — Assessment & Plan Note (Signed)
Reports stable and well controlled with trazodone 50-100mg  PO QHS.  Will send in refills to pharmacy on file.

## 2020-05-03 NOTE — Assessment & Plan Note (Signed)
Currently well controlled on omeprazole 20mg  once daily.  Plan: 1. Continue omeprazole 20mg  once daily. Side effects discussed. Pt wants to continue med. 2. Avoid diet triggers. Reviewed need to seek care if globus sensation, difficulty swallowing, s/sx of GI bleed. 3. Follow up as needed and in 6 months.

## 2020-05-03 NOTE — Assessment & Plan Note (Signed)
Requesting additional refills on atorvastatin.  Labs last 01/2020.  Refills sent to pharmacy on file.

## 2020-05-03 NOTE — Progress Notes (Signed)
Subjective:    Patient ID: [name], male    DOB: 11-Oct-1955, 64 y.o.   MRN: [id number]  [name] is a 64 y.o. male presenting on 05/03/2020 for Diabetes Mellitus (Need refills on medications )   HPI  [name] presents to clinic for a follow up on his diabetes.  He has acute concerns for medication refills.  Diabetes Pt presents today for follow up Type 2 Diabetes Mellitus.  He/she (caps): He ACTION; IS/IS NOT: is not checking AM CBG at home. -Current diabetic medications include: metformin 1000mg  BID WC -ACTION; IS/IS NOT: is not currently symptomatic -Actions; denies/reports/admits to: denies polydipsia, polyphagia, polyuria, headaches, diaphoresis, shakiness, chills, pain, numbness or tingling in extremities or changes in vision -Clinical course has been stable -Reports no exercise routine -Diet is high in salt, high in fat, and high in carbohydrates  PREVENTION Eye exam current (within 1 year) Up to date Foot exam current (within 1 year) Up to date Lipid/ASCVD risk reduction - on statin: YES/NO: Yes  Kidney Protection (On ACE/ARB)? YES/NO: Yes   Depression screen [facility] 2/9 08/01/2019 02/10/2019 11/08/2018  Decreased Interest 3 1 1   Down, Depressed, Hopeless 1 0 1  PHQ - 2 Score 4 1 2   Altered sleeping 2 3 0  Tired, decreased energy 3 3 2   Change in appetite 0 0 1  Feeling bad or failure about yourself  0 0 0  Trouble concentrating 3 1 0  Moving slowly or fidgety/restless 0 0 2  Suicidal thoughts 0 0 0  PHQ-9 Score 12 8 7   Difficult doing work/chores Somewhat difficult Not difficult at all Not difficult at all    Social History   Tobacco Use  . Smoking status: Former Smoker    Packs/day: 1.00    Years: 40.00    Pack years: 40.00    Types: Cigarettes    Quit date: 07/25/2018    Years since quitting: 1.7  . Smokeless tobacco: Former Network engineer  . Vaping Use: Never used  Substance Use Topics  . Alcohol use: Not Currently    Comment: quit  2003, drank about 5 beer per week prior  . Drug use: Never    Review of Systems  Constitutional: Negative.   HENT: Negative.   Eyes: Negative.   Respiratory: Negative.   Cardiovascular: Negative.   Gastrointestinal: Negative.   Endocrine: Negative.   Genitourinary: Negative.   Musculoskeletal: Negative.   Skin: Negative.   Allergic/Immunologic: Negative.   Neurological: Negative.   Hematological: Negative.   Psychiatric/Behavioral: Negative.    Per HPI unless specifically indicated above     Objective:    BP 111/81 (BP Location: Right Arm, Patient Position: Sitting, Cuff Size: Normal)   Pulse 96   Resp 17   Ht 5\' 6"  (1.676 m)   Wt 161 lb 3.2 oz (73.1 kg)   SpO2 99%   BMI 26.02 kg/m   Wt Readings from Last 3 Encounters:  05/03/20 161 lb 3.2 oz (73.1 kg)  02/07/20 158 lb 3.2 oz (71.8 kg)  01/10/20 155 lb 6.4 oz (70.5 kg)    Physical Exam Vitals and nursing note reviewed.  Constitutional:      General: He is not in acute distress.    Appearance: Normal appearance. He is well-developed, well-groomed and overweight. He is not ill-appearing or toxic-appearing.  HENT:     Head: Normocephalic and atraumatic.     Nose:     Comments: [name] is in place, covering  mouth and nose. Eyes:     General:        Right eye: No discharge.        Left eye: No discharge.     Extraocular Movements: Extraocular movements intact.     Conjunctiva/sclera: Conjunctivae normal.     Pupils: Pupils are equal, round, and reactive to light.  Cardiovascular:     Rate and Rhythm: Normal rate and regular rhythm.     Pulses: Normal pulses.     Heart sounds: Normal heart sounds. No murmur heard. No friction rub. No gallop.   Pulmonary:     Effort: Pulmonary effort is normal. No respiratory distress.     Breath sounds: Normal breath sounds.  Musculoskeletal:     Right lower leg: No edema.     Left lower leg: No edema.  Skin:    General: Skin is warm and dry.     Capillary Refill:  Capillary refill takes less than 2 seconds.  Neurological:     General: No focal deficit present.     Mental Status: He is alert and oriented to person, place, and time.  Psychiatric:        Attention and Perception: Attention and perception normal.        Mood and Affect: Mood and affect normal.        Speech: Speech normal.        Behavior: Behavior normal. Behavior is cooperative.        Thought Content: Thought content normal.        Cognition and Memory: Cognition and memory normal.    Results for orders placed or performed in visit on 05/03/20  POCT glycosylated hemoglobin (Hb A1C)  Result Value Ref Range   Hemoglobin A1C 7.1 (A) 4.0 - 5.6 %   HbA1c POC (<> result, manual entry)     HbA1c, POC (prediabetic range)     HbA1c, POC (controlled diabetic range)        Assessment & Plan:   Problem List Items Addressed This Visit      Cardiovascular and Mediastinum   Essential hypertension    Controlled hypertension.  BP is at goal < 130/80.  Pt reports working on lifestyle modifications.  Taking medications tolerating well without side effects.  Complications:  Overweight, COPD, OSA, GERD, T2DM, HLD  Plan: 1. Continue taking lisinopril 20mg  and hydrochlorothiazide 12.5mg  daily 2. Obtain labs at next visit  3. Encouraged heart healthy diet and increasing exercise to 30 minutes most days of the week, going no more than 2 days in a row without exercise. 4. Check BP 1-2 x per week at home, keep log, and bring to clinic at next appointment. 5. Follow up 6 months.       Relevant Medications   atorvastatin (LIPITOR) 40 MG tablet   hydrochlorothiazide (MICROZIDE) 12.5 MG capsule   lisinopril (ZESTRIL) 20 MG tablet     Respiratory   Chronic obstructive pulmonary disease (HCC)    Reports stable and well controlled, requesting refill on his albuterol so he will have if he needs.  Refill send to pharmacy on file.      Relevant Medications   albuterol (PROVENTIL) (2.5 MG/3ML)  0.083% nebulizer solution     Digestive   Gastroesophageal reflux disease    Currently well controlled on omeprazole 20mg  once daily.  Plan: 1. Continue omeprazole 20mg  once daily. Side effects discussed. Pt wants to continue med. 2. Avoid diet triggers. Reviewed need to seek care if globus sensation,  difficulty swallowing, s/sx of GI bleed. 3. Follow up as needed and in 6 months.       Relevant Medications   omeprazole (PRILOSEC) 20 MG capsule     Endocrine   Type 2 diabetes mellitus with other specified complication ([place])    ControlledDM with A1c 7.1% Worsening control from 6.5% on 02/07/2020 and goal A1c < 7.0%. - Complications - peripheral neuropathy, HTN, HLD, Overweight, GERD  Plan:  1. Continue current therapy: metformin 1000mg  BID WC 2. Encourage improved lifestyle: - low carb/low glycemic diet reinforced prior education - Increase physical activity to 30 minutes most days of the week.  Explained that increased physical activity increases body's use of sugar for energy. 3. Check fasting am CBG and log these.  Bring log to next visit for review 4. Continue ACEi and Statin 5. Up to date on DM eye and foot exam 6. Follow-up 6 months      Relevant Medications   metFORMIN (GLUCOPHAGE) 1000 MG tablet   atorvastatin (LIPITOR) 40 MG tablet   lisinopril (ZESTRIL) 20 MG tablet   Other Relevant Orders   POCT glycosylated hemoglobin (Hb A1C) (Completed)     Other   Primary insomnia    Reports stable and well controlled with trazodone 50-100mg  PO QHS.  Will send in refills to pharmacy on file.      Relevant Medications   traZODone (DESYREL) 100 MG tablet   Hyperlipidemia    Requesting additional refills on atorvastatin.  Labs last 01/2020.  Refills sent to pharmacy on file.      Relevant Medications   atorvastatin (LIPITOR) 40 MG tablet   hydrochlorothiazide (MICROZIDE) 12.5 MG capsule   lisinopril (ZESTRIL) 20 MG tablet   Anxiety    Reports stable and well  controlled with buspirone 5mg  TID.  Will send in refills.  RTC in 6 months.      Relevant Medications   busPIRone (BUSPAR) 5 MG tablet   traZODone (DESYREL) 100 MG tablet    Other Visit Diagnoses    Need for vaccination against Streptococcus pneumoniae using pneumococcal conjugate vaccine 13    -  Primary   Relevant Orders   Pneumococcal conjugate vaccine 13-valent (Completed)      Meds ordered this encounter  Medications  . metFORMIN (GLUCOPHAGE) 1000 MG tablet    Sig: Take 1 tablet twice daily with food for Diabetes    Dispense:  180 tablet    Refill:  1  . busPIRone (BUSPAR) 5 MG tablet    Sig: Take 1 tablet (5 mg total) by mouth 3 (three) times daily.    Dispense:  90 tablet    Refill:  1  . albuterol (PROVENTIL) (2.5 MG/3ML) 0.083% nebulizer solution    Sig: Take 3 mLs (2.5 mg total) by nebulization every 6 (six) hours as needed for wheezing or shortness of breath.    Dispense:  75 mL    Refill:  1    Please do not fill until patient requests.  [name] Kitchen atorvastatin (LIPITOR) 40 MG tablet    Sig: TAKE 1 TABLET BY MOUTH AT BEDTIME FOR HIGH CHOLESTEROL    Dispense:  90 tablet    Refill:  1  . hydrochlorothiazide (MICROZIDE) 12.5 MG capsule    Sig: TAKE ONE CAPSULE BY MOUTH ONCE DAILY FOR HIGH BLOOD PRESSURE    Dispense:  90 capsule    Refill:  1  . lisinopril (ZESTRIL) 20 MG tablet    Sig: Take 1 tablet (20 mg total) by mouth  daily. for high blood pressure    Dispense:  90 tablet    Refill:  1  . omeprazole (PRILOSEC) 20 MG capsule    Sig: Take 1 capsule (20 mg total) by mouth daily.    Dispense:  90 capsule    Refill:  1  . traZODone (DESYREL) 100 MG tablet    Sig: Take 0.5-1 tablets (50-100 mg total) by mouth at bedtime as needed for sleep.    Dispense:  90 tablet    Refill:  1    Follow up plan: Return in about 6 months (around 11/01/2020) for T2DM F/U.   Harlin Rain, [place] [facility] Nurse Practitioner [facility] 05/03/2020, 2:29 PM

## 2020-05-03 NOTE — Assessment & Plan Note (Signed)
Reports stable and well controlled with buspirone 5mg  TID.  Will send in refills.  RTC in 6 months.

## 2020-05-08 ENCOUNTER — Ambulatory Visit: Payer: Medicare Other | Admitting: Family Medicine

## 2020-05-24 ENCOUNTER — Telehealth: Payer: Self-pay

## 2020-05-24 NOTE — Telephone Encounter (Signed)
Copied from CRM [id number]. Topic: General - Other >> May 24, 2020  9:08 AM Marylen Ponto wrote: Reason for CRM: Kennon Rounds with Wound Care requests office notes regarding referral request on October 7 from patient head wound. fax# [phone] ph# [phone]

## 2020-05-24 NOTE — Telephone Encounter (Signed)
Notes printed and faxed over to [facility].

## 2020-05-28 ENCOUNTER — Encounter: Payer: Medicare Other | Attending: Physician Assistant | Admitting: Physician Assistant

## 2020-05-28 ENCOUNTER — Other Ambulatory Visit: Payer: Self-pay

## 2020-05-28 DIAGNOSIS — L98492 Non-pressure chronic ulcer of skin of other sites with fat layer exposed: Secondary | ICD-10-CM | POA: Diagnosis not present

## 2020-05-28 DIAGNOSIS — L98499 Non-pressure chronic ulcer of skin of other sites with unspecified severity: Secondary | ICD-10-CM | POA: Diagnosis present

## 2020-05-28 DIAGNOSIS — S0100XA Unspecified open wound of scalp, initial encounter: Secondary | ICD-10-CM | POA: Diagnosis not present

## 2020-05-28 DIAGNOSIS — W208XXD Other cause of strike by thrown, projected or falling object, subsequent encounter: Secondary | ICD-10-CM | POA: Diagnosis not present

## 2020-05-28 DIAGNOSIS — E11622 Type 2 diabetes mellitus with other skin ulcer: Secondary | ICD-10-CM | POA: Insufficient documentation

## 2020-05-28 DIAGNOSIS — C4442 Squamous cell carcinoma of skin of scalp and neck: Secondary | ICD-10-CM | POA: Insufficient documentation

## 2020-06-01 NOTE — Progress Notes (Signed)
XZAYDEN, LONGHURST ([id number]) Visit Report for 05/28/2020 Chief Complaint Document Details Patient Name: [name], [name]. Date of Service: 05/28/2020 8:30 AM Medical Record Number: [id number] Patient Account Number: [id number] Date of Birth/Sex: May 07, 1956 (64 y.o. M) Treating RN: Cornell Barman Primary Care Provider: Nobie Putnam Other Clinician: Referring Provider: Cyndia Skeeters Treating Provider/Extender: Skipper Cliche in Treatment: 0 Information Obtained from: Patient Chief Complaint Scalp traumatic ulcers Electronic Signature(s) Signed: 05/28/2020 9:01:05 AM By: Worthy Keeler PA-C Entered By: Worthy Keeler on 05/28/2020 09:01:04 [name] ([id number]) -------------------------------------------------------------------------------- Debridement Details Patient Name: [name] Date of Service: 05/28/2020 8:30 AM Medical Record Number: [id number] Patient Account Number: [id number] Date of Birth/Sex: [birth year] (64 y.o. M) Treating RN: Cornell Barman Primary Care Provider: Nobie Putnam Other Clinician: Referring Provider: Cyndia Skeeters Treating Provider/Extender: Skipper Cliche in Treatment: 0 Debridement Performed for Wound #1 Lateral Head - Parietal Assessment: Performed By: Physician Tommie Sams., PA-C Debridement Type: Chemical/Enzymatic/Mechanical Agent Used: Gauze and saline Level of Consciousness (Pre- Awake and Alert procedure): Pre-procedure Verification/Time Out Yes - 09:09 Taken: Start Time: 09:09 Instrument: Curette Bleeding: Moderate Hemostasis Achieved: Pressure Response to Treatment: Procedure was tolerated well Level of Consciousness (Post- Awake and Alert procedure): Post Debridement Measurements of Total Wound Length: (cm) 1.5 Width: (cm) 1.4 Depth: (cm) 0.5 Volume: (cm) 0.825 Character of Wound/Ulcer Post Debridement: Stable Post Procedure Diagnosis Same as Pre-procedure Electronic Signature(s) Signed: 05/28/2020  2:13:03 PM By: Worthy Keeler PA-C Signed: 05/28/2020 3:19:28 PM By: Gretta Cool, BSN, RN, CWS, Kim RN, BSN Entered By: Gretta Cool, BSN, RN, CWS, Kim on 05/28/2020 09:09:59 [name] ([id number]) -------------------------------------------------------------------------------- HPI Details Patient Name: [name]. Date of Service: 05/28/2020 8:30 AM Medical Record Number: [id number] Patient Account Number: [id number] Date of Birth/Sex: 02-09-1956 (64 y.o. M) Treating RN: Cornell Barman Primary Care Provider: Nobie Putnam Other Clinician: Referring Provider: Cyndia Skeeters Treating Provider/Extender: Skipper Cliche in Treatment: 0 History of Present Illness HPI Description: 7.2 a1c occurred june 2021 05/28/2020 on evaluation today patient appears for initial inspection here in our clinic concerning issues been having with a wound over the scalp area. This has been present since June 2021 when he was working with his son on a cabinet and a shelf fell on his head. Subsequently he tells me at this point that he has been using peroxide pretty much daily to clean the area and use an antibiotic ointment as well. With that being said he does have a family member that mentions up about the possibility of a skin cancer. I explained that there is a least a chance that there could be a concern at this point. With that being said I am also wondering if it just may be that he needs more appropriate wound care and that the wounds may heal much more effectively and quickly. Nonetheless I discussed with the patient that we may want to hold off on the biopsy this week but keep it in consideration for next week if we do not see significant improvement over that period of 7 days. The patient is in agreement with that plan. With that being said we will continue to monitor for anything worsening and again I did also recommend for the patient currently that he watch out for any signs of overall worsening. If  anything occurs dramatically over the next week for the same he would obviously let me know. The patient does have a history of diabetes with his most recent hemoglobin A1c being 7.2. He also  does have a history of hypertension. Electronic Signature(s) Signed: 05/28/2020 2:10:48 PM By: Worthy Keeler PA-C Entered By: Worthy Keeler on 05/28/2020 14:10:48 [name] ([id number]) -------------------------------------------------------------------------------- Physical Exam Details Patient Name: [name] Date of Service: 05/28/2020 8:30 AM Medical Record Number: [id number] Patient Account Number: [id number] Date of Birth/Sex: April 10, 1956 (64 y.o. M) Treating RN: Cornell Barman Primary Care Provider: Nobie Putnam Other Clinician: Referring Provider: Cyndia Skeeters Treating Provider/Extender: Skipper Cliche in Treatment: 0 Constitutional sitting or standing blood pressure is within target range for patient.. pulse regular and within target range for patient.[name] Kitchen respirations regular, non- labored and within target range for patient.[name] Kitchen temperature within target range for patient.. Well-nourished and well-hydrated in no acute distress. Eyes conjunctiva clear no eyelid edema noted. pupils equal round and reactive to light and accommodation. Ears, Nose, Mouth, and Throat no gross abnormality of ear auricles or external auditory canals. normal hearing noted during conversation. mucus membranes moist. Respiratory normal breathing without difficulty. Musculoskeletal normal gait and posture. no significant deformity or arthritic changes, no loss or range of motion, no clubbing. Psychiatric this patient is able to make decisions and demonstrates good insight into disease process. Alert and Oriented x 3. pleasant and cooperative. Notes Upon inspection patient's wound actually did show some signs of somewhat hyper granular tissue. Again I am not entirely certain that this appears to be  cancerous in nature although that something that definitely I think is on the right arm we need to keep an eye out for. With that being as I explained to the patient this very well may be that he just needs appropriate skin/wound care. He has been using peroxide daily then antibiotic ointment to things which probably are not helping him tremendously as far as getting this area to heal at this point. I discussed that we may want to use Hydrofera Blue to try to see what we can do to help him out at this point. Electronic Signature(s) Signed: 05/28/2020 2:11:30 PM By: Worthy Keeler PA-C Entered By: Worthy Keeler on 05/28/2020 14:11:29 [name] ([id number]) -------------------------------------------------------------------------------- Physician Orders Details Patient Name: [name] Date of Service: 05/28/2020 8:30 AM Medical Record Number: [id number] Patient Account Number: [id number] Date of Birth/Sex: [birth year] (64 y.o. M) Treating RN: Cornell Barman Primary Care Provider: Nobie Putnam Other Clinician: Referring Provider: Cyndia Skeeters Treating Provider/Extender: Skipper Cliche in Treatment: 0 Verbal / Phone Orders: No Diagnosis Coding ICD-10 Coding Code Description S01.00XA Unspecified open wound of scalp, initial encounter L98.492 Non-pressure chronic ulcer of skin of other sites with fat layer exposed E11.622 Type 2 diabetes mellitus with other skin ulcer I10 Essential (primary) hypertension Follow-up Appointments o Return Appointment in 1 week. Wound Cleansing/Bathing/Shower/Hygiene o Clean wound with Normal Saline. o May shower without dressing. Gently cleanse wound with antibacterial soap, rinse and pat dry prior to dressing wounds Primary Wound Dressing Wound #1 Lateral Head - Parietal o Hydrafera Blue Ready Transfer Wound #2 Midline Head - Parietal o Hydrafera Blue Ready Transfer Secondary Dressing Wound #1 Lateral Head - Parietal o  Other - coverlet Wound #2 Midline Head - Parietal o Other - coverlet Dressing Change Frequency Wound #1 Lateral Head - Parietal o Change Dressing Monday, Wednesday, Friday Wound #2 Midline Head - Parietal o Change Dressing Monday, Wednesday, Friday Additional Orders / Instructions o Follow Nutritious Diet o Increase protein intake. Electronic Signature(s) Signed: 05/28/2020 2:13:03 PM By: Worthy Keeler PA-C Signed: 05/28/2020 3:19:28 PM By: Gretta Cool, BSN, RN, CWS, Kim  RN, BSN Entered By: Gretta Cool, BSN, RN, CWS, Kim on 05/28/2020 09:16:33 [name] ([id number]) -------------------------------------------------------------------------------- Problem List Details Patient Name: [name] Date of Service: 05/28/2020 8:30 AM Medical Record Number: [id number] Patient Account Number: [id number] Date of Birth/Sex: 12-11-1955 (64 y.o. M) Treating RN: Cornell Barman Primary Care Provider: Nobie Putnam Other Clinician: Referring Provider: Cyndia Skeeters Treating Provider/Extender: Skipper Cliche in Treatment: 0 Active Problems ICD-10 Encounter Code Description Active Date MDM Diagnosis S01.00XA Unspecified open wound of scalp, initial encounter 05/28/2020 No Yes L98.492 Non-pressure chronic ulcer of skin of other sites with fat layer exposed 05/28/2020 No Yes E11.622 Type 2 diabetes mellitus with other skin ulcer 05/28/2020 No Yes I10 Essential (primary) hypertension 05/28/2020 No Yes Inactive Problems Resolved Problems Electronic Signature(s) Signed: 05/28/2020 9:00:49 AM By: Worthy Keeler PA-C Entered By: Worthy Keeler on 05/28/2020 09:00:49 [name] ([id number]) -------------------------------------------------------------------------------- Progress Note Details Patient Name: [name] Date of Service: 05/28/2020 8:30 AM Medical Record Number: [id number] Patient Account Number: [id number] Date of Birth/Sex: 1956/04/14 (64 y.o. M) Treating RN: Cornell Barman Primary Care Provider: Nobie Putnam Other Clinician: Referring Provider: Cyndia Skeeters Treating Provider/Extender: Skipper Cliche in Treatment: 0 Subjective Chief Complaint Information obtained from Patient Scalp traumatic ulcers History of Present Illness (HPI) 7.2 a1c occurred june 2021 05/28/2020 on evaluation today patient appears for initial inspection here in our clinic concerning issues been having with a wound over the scalp area. This has been present since June 2021 when he was working with his son on a cabinet and a shelf fell on his head. Subsequently he tells me at this point that he has been using peroxide pretty much daily to clean the area and use an antibiotic ointment as well. With that being said he does have a family member that mentions up about the possibility of a skin cancer. I explained that there is a least a chance that there could be a concern at this point. With that being said I am also wondering if it just may be that he needs more appropriate wound care and that the wounds may heal much more effectively and quickly. Nonetheless I discussed with the patient that we may want to hold off on the biopsy this week but keep it in consideration for next week if we do not see significant improvement over that period of 7 days. The patient is in agreement with that plan. With that being said we will continue to monitor for anything worsening and again I did also recommend for the patient currently that he watch out for any signs of overall worsening. If anything occurs dramatically over the next week for the same he would obviously let me know. The patient does have a history of diabetes with his most recent hemoglobin A1c being 7.2. He also does have a history of hypertension. Patient History Information obtained from Patient. Allergies No Known Allergies Family History Hypertension - Siblings,Father, Seizures - Mother, Stroke - Siblings,Father, No  family history of Cancer, Diabetes, Heart Disease, Hereditary Spherocytosis, Kidney Disease, Lung Disease, Thyroid Problems, Tuberculosis. Social History Former smoker, Marital Status - Divorced, Alcohol Use - Never, Drug Use - No History, Caffeine Use - Daily. Medical History Eyes Denies history of Cataracts, Glaucoma, Optic Neuritis Ear/Nose/Mouth/Throat Denies history of Chronic sinus problems/congestion, Middle ear problems Hematologic/Lymphatic Denies history of Anemia, Hemophilia, Human Immunodeficiency Virus, Lymphedema, Sickle Cell Disease Respiratory Denies history of Aspiration, Asthma, Chronic Obstructive Pulmonary Disease (COPD), Pneumothorax, Sleep Apnea, Tuberculosis Cardiovascular Patient  has history of Hypertension Denies history of Angina, Arrhythmia, Congestive Heart Failure, Coronary Artery Disease, Deep Vein Thrombosis, Hypotension, Myocardial Infarction, Peripheral Arterial Disease, Peripheral Venous Disease, Phlebitis, Vasculitis Gastrointestinal Denies history of Cirrhosis , Colitis, Crohn s, Hepatitis A, Hepatitis B, Hepatitis C Endocrine Patient has history of Type II Diabetes Denies history of Type I Diabetes Genitourinary Denies history of End Stage Renal Disease Immunological Denies history of Lupus Erythematosus, Raynaud s, Scleroderma Integumentary (Skin) Denies history of History of Burn, History of pressure wounds Musculoskeletal Denies history of Gout, Rheumatoid Arthritis, Osteoarthritis, Osteomyelitis Neurologic Denies history of Dementia, Neuropathy, Quadriplegia, Paraplegia, Seizure Disorder Oncologic Denies history of Received Chemotherapy, Received Radiation Psychiatric [name], [name] ([id number]) Denies history of Anorexia/bulimia, Confinement Anxiety Patient is treated with Oral Agents. Blood sugar is not tested. Review of Systems (ROS) Constitutional Symptoms (General Health) Denies complaints or symptoms of Fatigue, Fever, Chills,  Marked Weight Change. Eyes Complains or has symptoms of Glasses / Contacts. Denies complaints or symptoms of Dry Eyes, Vision Changes. Ear/Nose/Mouth/Throat Denies complaints or symptoms of Difficult clearing ears, Sinusitis. Hematologic/Lymphatic Denies complaints or symptoms of Bleeding / Clotting Disorders, Human Immunodeficiency Virus. Respiratory Denies complaints or symptoms of Chronic or frequent coughs, Shortness of Breath. Cardiovascular Denies complaints or symptoms of Chest pain, LE edema. Gastrointestinal Denies complaints or symptoms of Frequent diarrhea, Nausea, Vomiting. Endocrine Denies complaints or symptoms of Hepatitis, Thyroid disease, Polydypsia (Excessive Thirst). Genitourinary Denies complaints or symptoms of Kidney failure/ Dialysis, Incontinence/dribbling. Immunological Denies complaints or symptoms of Hives, Itching. Integumentary (Skin) Complains or has symptoms of Wounds. Denies complaints or symptoms of Bleeding or bruising tendency, Breakdown, Swelling. Musculoskeletal Denies complaints or symptoms of Muscle Pain, Muscle Weakness. Neurologic Denies complaints or symptoms of Numbness/parasthesias, Focal/Weakness. Psychiatric Denies complaints or symptoms of Anxiety, Claustrophobia. Objective Constitutional sitting or standing blood pressure is within target range for patient.. pulse regular and within target range for patient.[name] Kitchen respirations regular, non- labored and within target range for patient.[name] Kitchen temperature within target range for patient.. Well-nourished and well-hydrated in no acute distress. Vitals Time Taken: 8:29 AM, Height: 66 in, Source: Stated, Weight: 160 lbs, Source: Stated, BMI: 25.8, Temperature: 98.3 F, Pulse: 84 bpm, Respiratory Rate: 18 breaths/min, Blood Pressure: 109/67 mmHg. Eyes conjunctiva clear no eyelid edema noted. pupils equal round and reactive to light and accommodation. Ears, Nose, Mouth, and Throat no gross  abnormality of ear auricles or external auditory canals. normal hearing noted during conversation. mucus membranes moist. Respiratory normal breathing without difficulty. Musculoskeletal normal gait and posture. no significant deformity or arthritic changes, no loss or range of motion, no clubbing. Psychiatric this patient is able to make decisions and demonstrates good insight into disease process. Alert and Oriented x 3. pleasant and cooperative. General Notes: Upon inspection patient's wound actually did show some signs of somewhat hyper granular tissue. Again I am not entirely certain that this appears to be cancerous in nature although that something that definitely I think is on the right arm we need to keep an eye out for. With that being as I explained to the patient this very well may be that he just needs appropriate skin/wound care. He has been using peroxide daily then antibiotic ointment to things which probably are not helping him tremendously as far as getting this area to heal at this point. I discussed that we may want to use Hydrofera Blue to try to see what we can do to help him out at this point. Integumentary (Hair, Skin) Wound #1 status is Open.  Original cause of wound was Trauma. The wound is located on the Lateral Head - Parietal. The wound measures 1.5cm Rolfe, Esdras E. ([id number]) length x 1.4cm width x 0.4cm depth; 1.649cm^2 area and 0.66cm^3 volume. There is Fat Layer (Subcutaneous Tissue) exposed. There is no tunneling or undermining noted. There is a medium amount of purulent drainage noted. There is large (67-100%) red granulation within the wound bed. There is a small (1-33%) amount of necrotic tissue within the wound bed including Adherent Slough. Wound #2 status is Open. Original cause of wound was Trauma. The wound is located on the Midline Head - Parietal. The wound measures 1cm length x 1.3cm width x 0.1cm depth; 1.021cm^2 area and 0.102cm^3 volume. There is  no tunneling or undermining noted. There is a medium amount of serosanguineous drainage noted. There is medium (34-66%) red granulation within the wound bed. There is a medium (34-66%) amount of necrotic tissue within the wound bed including Adherent Slough. Assessment Active Problems ICD-10 Unspecified open wound of scalp, initial encounter Non-pressure chronic ulcer of skin of other sites with fat layer exposed Type 2 diabetes mellitus with other skin ulcer Essential (primary) hypertension Procedures Wound #1 Pre-procedure diagnosis of Wound #1 is an Atypical located on the Lateral Head - Parietal . There was a Chemical/Enzymatic/Mechanical debridement performed by Tommie Sams., PA-C. With the following instrument(s): Curette. Other agent used was Gauze and saline. A time out was conducted at 09:09, prior to the start of the procedure. A Moderate amount of bleeding was controlled with Pressure. The procedure was tolerated well. Post Debridement Measurements: 1.5cm length x 1.4cm width x 0.5cm depth; 0.825cm^3 volume. Character of Wound/Ulcer Post Debridement is stable. Post procedure Diagnosis Wound #1: Same as Pre-Procedure Plan Follow-up Appointments: Return Appointment in 1 week. Wound Cleansing/Bathing/Shower/Hygiene: Clean wound with Normal Saline. May shower without dressing. Gently cleanse wound with antibacterial soap, rinse and pat dry prior to dressing wounds Primary Wound Dressing: Wound #1 Lateral Head - Parietal: Hydrafera Blue Ready Transfer Wound #2 Midline Head - Parietal: Hydrafera Blue Ready Transfer Secondary Dressing: Wound #1 Lateral Head - Parietal: Other - coverlet Wound #2 Midline Head - Parietal: Other - coverlet Dressing Change Frequency: Wound #1 Lateral Head - Parietal: Change Dressing Monday, Wednesday, Friday Wound #2 Midline Head - Parietal: Change Dressing Monday, Wednesday, Friday Additional Orders / Instructions: Follow Nutritious  Diet Increase protein intake. 1. Would recommend currently that we initiate treatment with the [facility] dressing and the patient is in agreement the plan I see no evidence of infection at this time. 2. I am also can recommend currently that we have the patient continue to monitor for any signs of worsening infection in general. If he develops anything that he is concerned about he should let me know soon as possible. [name], William E. ([id number]) 3. I am also going to recommend that we cover this with just a coverlet after the [facility] I think that is the best way to secure in place. 4. If things do not seem to be doing dramatically better come next week then we will proceed with a biopsy of the area just to ensure that were not dealing with a cancerous lesion. We will see patient back for reevaluation in 1 week here in the clinic. If anything worsens or changes patient will contact our office for additional recommendations. Electronic Signature(s) Signed: 05/28/2020 2:12:25 PM By: Worthy Keeler PA-C Entered By: Worthy Keeler on 05/28/2020 14:12:24 [name] ([id number]) -------------------------------------------------------------------------------- ROS/PFSH  Details Patient Name: [name], [name]. Date of Service: 05/28/2020 8:30 AM Medical Record Number: [id number] Patient Account Number: [id number] Date of Birth/Sex: [birth year] (64 y.o. M) Treating RN: Carlene Coria Primary Care Provider: Nobie Putnam Other Clinician: Referring Provider: Cyndia Skeeters Treating Provider/Extender: Skipper Cliche in Treatment: 0 Information Obtained From Patient Constitutional Symptoms (General Health) Complaints and Symptoms: Negative for: Fatigue; Fever; Chills; Marked Weight Change Eyes Complaints and Symptoms: Positive for: Glasses / Contacts Negative for: Dry Eyes; Vision Changes Medical History: Negative for: Cataracts; Glaucoma; Optic  Neuritis Ear/Nose/Mouth/Throat Complaints and Symptoms: Negative for: Difficult clearing ears; Sinusitis Medical History: Negative for: Chronic sinus problems/congestion; Middle ear problems Hematologic/Lymphatic Complaints and Symptoms: Negative for: Bleeding / Clotting Disorders; Human Immunodeficiency Virus Medical History: Negative for: Anemia; Hemophilia; Human Immunodeficiency Virus; Lymphedema; Sickle Cell Disease Respiratory Complaints and Symptoms: Negative for: Chronic or frequent coughs; Shortness of Breath Medical History: Negative for: Aspiration; Asthma; Chronic Obstructive Pulmonary Disease (COPD); Pneumothorax; Sleep Apnea; Tuberculosis Cardiovascular Complaints and Symptoms: Negative for: Chest pain; LE edema Medical History: Positive for: Hypertension Negative for: Angina; Arrhythmia; Congestive Heart Failure; Coronary Artery Disease; Deep Vein Thrombosis; Hypotension; Myocardial Infarction; Peripheral Arterial Disease; Peripheral Venous Disease; Phlebitis; Vasculitis Gastrointestinal Complaints and Symptoms: Negative for: Frequent diarrhea; Nausea; Vomiting Medical History: Negative for: Cirrhosis ; Colitis; Crohnos; Hepatitis A; Hepatitis B; Hepatitis C Endocrine [name], William E. ([id number]) Complaints and Symptoms: Negative for: Hepatitis; Thyroid disease; Polydypsia (Excessive Thirst) Medical History: Positive for: Type II Diabetes Negative for: Type I Diabetes Time with diabetes: 9 years Treated with: Oral agents Blood sugar tested every day: No Genitourinary Complaints and Symptoms: Negative for: Kidney failure/ Dialysis; Incontinence/dribbling Medical History: Negative for: End Stage Renal Disease Immunological Complaints and Symptoms: Negative for: Hives; Itching Medical History: Negative for: Lupus Erythematosus; Raynaudos; Scleroderma Integumentary (Skin) Complaints and Symptoms: Positive for: Wounds Negative for: Bleeding or bruising  tendency; Breakdown; Swelling Medical History: Negative for: History of Burn; History of pressure wounds Musculoskeletal Complaints and Symptoms: Negative for: Muscle Pain; Muscle Weakness Medical History: Negative for: Gout; Rheumatoid Arthritis; Osteoarthritis; Osteomyelitis Neurologic Complaints and Symptoms: Negative for: Numbness/parasthesias; Focal/Weakness Medical History: Negative for: Dementia; Neuropathy; Quadriplegia; Paraplegia; Seizure Disorder Psychiatric Complaints and Symptoms: Negative for: Anxiety; Claustrophobia Medical History: Negative for: Anorexia/bulimia; Confinement Anxiety Oncologic Medical History: Negative for: Received Chemotherapy; Received Radiation Immunizations Pneumococcal Vaccine: Received Pneumococcal Vaccination: No Implantable Devices [name], DORT. ([id number]) None Family and Social History Cancer: No; Diabetes: No; Heart Disease: No; Hereditary Spherocytosis: No; Hypertension: Yes - Siblings,Father; Kidney Disease: No; Lung Disease: No; Seizures: Yes - Mother; Stroke: Yes - Siblings,Father; Thyroid Problems: No; Tuberculosis: No; Former smoker; Marital Status - Divorced; Alcohol Use: Never; Drug Use: No History; Caffeine Use: Daily Electronic Signature(s) Signed: 05/28/2020 2:13:03 PM By: Worthy Keeler PA-C Signed: 06/01/2020 4:22:48 PM By: Carlene Coria RN Entered By: Carlene Coria on 05/28/2020 08:45:30 [name] ([id number]) -------------------------------------------------------------------------------- SuperBill Details Patient Name: [name] Date of Service: 05/28/2020 Medical Record Number: [id number] Patient Account Number: [id number] Date of Birth/Sex: 10/29/1955 (64 y.o. 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 I10 Essential (primary) hypertension Facility Procedures CPT4 Code: [id number] Description: 99213 - WOUND CARE VISIT-LEV 3 EST PT Modifier: Quantity:  1 Physician Procedures CPT4 Code: [id number] Description: WC PHYS LEVEL 3 o NEW PT Modifier: Quantity: 1 CPT4 Code: Description: ICD-10 Diagnosis Description S01.00XA Unspecified open wound of scalp, initial encounter 

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
## 2020-06-01 NOTE — Progress Notes (Signed)
CRISPIN, VOGEL ([id number]) Visit Report for 05/28/2020 Abuse/Suicide Risk Screen Details Patient Name: [name], [name]. Date of Service: 05/28/2020 8:30 AM Medical Record Number: [id number] Patient Account Number: [id number] Date of Birth/Sex: 07-13-1955 (64 y.o. M) Treating RN: Carlene Coria Primary Care Maye Parkinson: Nobie Putnam Other Clinician: Referring Shannelle Alguire: Cyndia Skeeters Treating Toyia Jelinek/Extender: Skipper Cliche in Treatment: 0 Abuse/Suicide Risk Screen Items Answer ABUSE RISK SCREEN: Has anyone close to you tried to hurt or harm you recentlyo No Do you feel uncomfortable with anyone in your familyo No Has anyone forced you do things that you didnot want to doo No Electronic Signature(s) Signed: 06/01/2020 4:22:48 PM By: Carlene Coria RN Entered By: Carlene Coria on 05/28/2020 08:45:38 Lizarraga, Royston Bake ([id number]) -------------------------------------------------------------------------------- Activities of Daily Living Details Patient Name: [name]. Date of Service: 05/28/2020 8:30 AM Medical Record Number: [id number] Patient Account Number: [id number] Date of Birth/Sex: 1955-09-19 (64 y.o. M) Treating RN: Carlene Coria Primary Care Treniece Holsclaw: Nobie Putnam Other Clinician: Referring Doniqua Saxby: Cyndia Skeeters Treating Ericia Moxley/Extender: Skipper Cliche in Treatment: 0 Activities of Daily Living Items Answer Activities of Daily Living (Please select one for each item) Drive Automobile Completely Able Take Medications Completely Able Use Telephone Completely Able Care for Appearance Completely Able Use Toilet Completely Able Bath / Shower Completely Able Dress Self Completely Able Feed Self Completely Able Walk Completely Able Get In / Out Bed Completely Able Housework Completely Able Prepare Meals Completely Able Handle Money Completely Able Shop for Self Completely Able Electronic Signature(s) Signed: 06/01/2020 4:22:48 PM By: Carlene Coria  RN Entered By: Carlene Coria on 05/28/2020 08:46:07 [name] ([id number]) -------------------------------------------------------------------------------- Education Screening Details Patient Name: [name] Date of Service: 05/28/2020 8:30 AM Medical Record Number: [id number] Patient Account Number: [id number] Date of Birth/Sex: August 13, 1955 (64 y.o. M) Treating RN: Carlene Coria Primary Care Ellyce Lafevers: Nobie Putnam Other Clinician: Referring Gwenetta Devos: Cyndia Skeeters Treating Keylan Costabile/Extender: Skipper Cliche in Treatment: 0 Primary Learner Assessed: Patient Learning Preferences/Education Level/Primary Language Learning Preference: Explanation Highest Education Level: High School Preferred Language: English Cognitive Barrier Language Barrier: No Translator Needed: No Memory Deficit: No Emotional Barrier: No Cultural/Religious Beliefs Affecting Medical Care: No Physical Barrier Impaired Vision: Yes Glasses Impaired Hearing: No Decreased Hand dexterity: No Knowledge/Comprehension Knowledge Level: Medium Comprehension Level: Medium Ability to understand written instructions: Medium Ability to understand verbal instructions: Medium Motivation Anxiety Level: Anxious Cooperation: Cooperative Education Importance: Acknowledges Need Interest in Health Problems: Asks Questions Perception: Coherent Willingness to Engage in Self-Management High Activities: Readiness to Engage in Self-Management High Activities: Electronic Signature(s) Signed: 06/01/2020 4:22:48 PM By: Carlene Coria RN Entered By: Carlene Coria on 05/28/2020 08:46:44 [name] ([id number]) -------------------------------------------------------------------------------- Fall Risk Assessment Details Patient Name: [name] Date of Service: 05/28/2020 8:30 AM Medical Record Number: [id number] Patient Account Number: [id number] Date of Birth/Sex: [birth year] (64 y.o. M) Treating RN: Carlene Coria Primary Care Paizley Ramella: Nobie Putnam Other Clinician: Referring Jannice Beitzel: Cyndia Skeeters Treating Jawaun Celmer/Extender: Skipper Cliche in Treatment: 0 Fall Risk Assessment Items Have you had 2 or more falls in the last 12 monthso 0 No Have you had any fall that resulted in injury in the last 12 monthso 0 No FALLS RISK SCREEN History of falling - immediate or within 3 months 0 No Secondary diagnosis (Do you have 2 or more medical diagnoseso) 0 No Ambulatory aid None/bed rest/wheelchair/nurse 0 No Crutches/cane/walker 0 No Furniture 0 No Intravenous therapy Access/Saline/Heparin Lock 0 No Gait/Transferring Normal/ bed rest/ wheelchair 0 No Weak (short steps with  or without shuffle, stooped but able to lift head while walking, may 0 No seek support from furniture) Impaired (short steps with shuffle, may have difficulty arising from chair, head down, impaired 0 No balance) Mental Status Oriented to own ability 0 No Electronic Signature(s) Signed: 06/01/2020 4:22:48 PM By: Carlene Coria RN Entered By: Carlene Coria on 05/28/2020 08:46:52 Demma, Royston Bake ([id number]) -------------------------------------------------------------------------------- Foot Assessment Details Patient Name: [name] Date of Service: 05/28/2020 8:30 AM Medical Record Number: [id number] Patient Account Number: [id number] Date of Birth/Sex: [birth year] (64 y.o. M) Treating RN: Carlene Coria Primary Care Joby Hershkowitz: Nobie Putnam Other Clinician: Referring Kavi Almquist: Cyndia Skeeters Treating Richelle Glick/Extender: Skipper Cliche in Treatment: 0 Foot Assessment Items Site Locations + = Sensation present, - = Sensation absent, C = Callus, U = Ulcer R = Redness, W = Warmth, M = Maceration, PU = Pre-ulcerative lesion F = Fissure, S = Swelling, D = Dryness Assessment Right: Left: Other Deformity: No No Prior Foot Ulcer: No No Prior Amputation: No No Charcot Joint: No No Ambulatory  Status: Gait: Electronic Signature(s) Signed: 06/01/2020 4:22:48 PM By: Carlene Coria RN Entered By: Carlene Coria on 05/28/2020 08:47:21 Ytuarte, Royston Bake ([id number]) -------------------------------------------------------------------------------- Nutrition Risk Screening Details Patient Name: [name] Date of Service: 05/28/2020 8:30 AM Medical Record Number: [id number] Patient Account Number: [id number] Date of Birth/Sex: 1955-11-14 (64 y.o. M) Treating RN: Carlene Coria Primary Care Lebert Lovern: Nobie Putnam Other Clinician: Referring Nikodem Leadbetter: Cyndia Skeeters Treating Abbigale Mcelhaney/Extender: Skipper Cliche in Treatment: 0 Height (in): 66 Weight (lbs): 160 Body Mass Index (BMI): 25.8 Nutrition Risk Screening Items Score Screening NUTRITION RISK SCREEN: I have an illness or condition that made me change the kind and/or amount of food I eat 0 No I eat fewer than two meals per day 0 No I eat few fruits and vegetables, or milk products 0 No I have three or more drinks of beer, liquor or wine almost every day 0 No I have tooth or mouth problems that make it hard for me to eat 0 No I don't always have enough money to buy the food I need 0 No I eat alone most of the time 0 No I take three or more different prescribed or over-the-counter drugs a day 1 Yes Without wanting to, I have lost or gained 10 pounds in the last six months 0 No I am not always physically able to shop, cook and/or feed myself 0 No Nutrition Protocols Good Risk Protocol 0 No interventions needed Moderate Risk Protocol High Risk Proctocol Risk Level: Good Risk Score: 1 Electronic Signature(s) Signed: 06/01/2020 4:22:48 PM By: Carlene Coria RN Entered By: Carlene Coria on 05/28/2020 08:47:04

## 2020-06-04 ENCOUNTER — Encounter: Payer: Medicare Other | Admitting: Physician Assistant

## 2020-06-04 ENCOUNTER — Other Ambulatory Visit: Payer: Self-pay

## 2020-06-04 DIAGNOSIS — S0100XA Unspecified open wound of scalp, initial encounter: Secondary | ICD-10-CM | POA: Diagnosis not present

## 2020-06-04 DIAGNOSIS — L98499 Non-pressure chronic ulcer of skin of other sites with unspecified severity: Secondary | ICD-10-CM | POA: Diagnosis not present

## 2020-06-04 DIAGNOSIS — C4442 Squamous cell carcinoma of skin of scalp and neck: Secondary | ICD-10-CM | POA: Diagnosis not present

## 2020-06-04 DIAGNOSIS — E11622 Type 2 diabetes mellitus with other skin ulcer: Secondary | ICD-10-CM | POA: Diagnosis not present

## 2020-06-04 DIAGNOSIS — L98492 Non-pressure chronic ulcer of skin of other sites with fat layer exposed: Secondary | ICD-10-CM | POA: Diagnosis not present

## 2020-06-04 NOTE — Progress Notes (Signed)
DASANI, CREAR ([id number]) Visit Report for 06/04/2020 Biopsy Details Patient Name: [name], [name]. Date of Service: 06/04/2020 1:45 PM Medical Record Number: [id number] Patient Account Number: [id number] Date of Birth/Sex: 30-May-1955 (64 y.o. M) Treating RN: Dolan Amen Primary Care Provider: Cyndia Skeeters Other Clinician: Referring Provider: Nobie Putnam Treating Provider/Extender: Skipper Cliche in Treatment: 1 Biopsy Performed for: Wound #1 Lateral Head - Parietal Location(s): Wound Bed Performed By: Physician Tommie Sams., PA-C Tissue Punch: Yes Size (mm): 5 Number of Specimens Taken: 1 Specimen Sent To Pathology: Yes Level of Consciousness (Pre-procedure): Awake and Alert Pre-procedure Verification/Time-Out Taken: Yes - 14:36 Pain Control: Lidocaine Injectable Lidocaine Percent: 2% Instrument: Forceps, Scissors Bleeding: Moderate Hemostasis Achieved: Pressure Procedural Pain: 0 Post Procedural Pain: 0 Response to Treatment: Procedure was tolerated well Level of Consciousness (Post-procedure): Awake and Alert Post Procedure Diagnosis Same as Pre-procedure Electronic Signature(s) Signed: 06/04/2020 4:50:07 PM By: Georges Mouse, Minus Breeding RN Signed: 06/04/2020 5:45:21 PM By: Worthy Keeler PA-C Entered By: Georges Mouse, Minus Breeding on 06/04/2020 14:52:15 [name] ([id number]) -------------------------------------------------------------------------------- Chief Complaint Document Details Patient Name: [name]. Date of Service: 06/04/2020 1:45 PM Medical Record Number: [id number] Patient Account Number: [id number] Date of Birth/Sex: 03-14-1956 (64 y.o. M) Treating RN: Cornell Barman Primary Care Provider: Cyndia Skeeters Other Clinician: Referring Provider: Nobie Putnam Treating Provider/Extender: Skipper Cliche in Treatment: 1 Information Obtained from: Patient Chief Complaint Scalp traumatic ulcers Electronic Signature(s) Signed:  06/04/2020 5:45:21 PM By: Worthy Keeler PA-C Entered By: Worthy Keeler on 06/04/2020 14:03:00 [name] ([id number]) -------------------------------------------------------------------------------- HPI Details Patient Name: [name] Date of Service: 06/04/2020 1:45 PM Medical Record Number: [id number] Patient Account Number: [id number] Date of Birth/Sex: 24-Sep-1955 (64 y.o. M) Treating RN: Cornell Barman Primary Care Provider: Cyndia Skeeters Other Clinician: Referring Provider: Nobie Putnam Treating Provider/Extender: Skipper Cliche in Treatment: 1 History of Present Illness HPI Description: 7.2 a1c occurred june 2021 05/28/2020 on evaluation today patient appears for initial inspection here in our clinic concerning issues been having with a wound over the scalp area. This has been present since June 2021 when he was working with his son on a cabinet and a shelf fell on his head. Subsequently he tells me at this point that he has been using peroxide pretty much daily to clean the area and use an antibiotic ointment as well. With that being said he does have a family member that mentions up about the possibility of a skin cancer. I explained that there is a least a chance that there could be a concern at this point. With that being said I am also wondering if it just may be that he needs more appropriate wound care and that the wounds may heal much more effectively and quickly. Nonetheless I discussed with the patient that we may want to hold off on the biopsy this week but keep it in consideration for next week if we do not see significant improvement over that period of 7 days. The patient is in agreement with that plan. With that being said we will continue to monitor for anything worsening and again I did also recommend for the patient currently that he watch out for any signs of overall worsening. If anything occurs dramatically over the next week for the same he  would obviously let me know. The patient does have a history of diabetes with his most recent hemoglobin A1c being 7.2. He also does have a history of hypertension. 06/05/2019 upon evaluation today  patient appears to be doing really about the same in regard to the wounds on his scalp. I did discuss with him last visit and I think it still true this time that we probably should go ahead and proceed with the biopsy since there is not a significant improvement. The patient voiced understanding and agreement. He would like to do what is needed to try to get this healed and under control. Electronic Signature(s) Signed: 06/04/2020 5:25:08 PM By: Worthy Keeler PA-C Entered By: Worthy Keeler on 06/04/2020 17:25:08 [name] ([id number]) -------------------------------------------------------------------------------- Physical Exam Details Patient Name: [name] Date of Service: 06/04/2020 1:45 PM Medical Record Number: [id number] Patient Account Number: [id number] Date of Birth/Sex: 1956-01-22 (64 y.o. M) Treating RN: Cornell Barman Primary Care Provider: Cyndia Skeeters Other Clinician: Referring Provider: Nobie Putnam Treating Provider/Extender: Skipper Cliche in Treatment: 1 Constitutional Well-nourished and well-hydrated in no acute distress. Respiratory normal breathing without difficulty. Psychiatric this patient is able to make decisions and demonstrates good insight into disease process. Alert and Oriented x 3. pleasant and cooperative. Notes I therefore did go ahead and proceed with a punch biopsy today from the wound margin of the patient's scalp. He actually tolerated that with minimal bleeding once I numbed him with lidocaine 2%. Fortunately he did not have much pain at all and post sample collection he seemed to also be doing quite well. No silver nitrate was needed for chemical cauterization I was able to secure this with pressure only. Electronic  Signature(s) Signed: 06/04/2020 5:25:24 PM By: Worthy Keeler PA-C Entered By: Worthy Keeler on 06/04/2020 17:25:23 [name] ([id number]) -------------------------------------------------------------------------------- Physician Orders Details Patient Name: [name] Date of Service: 06/04/2020 1:45 PM Medical Record Number: [id number] Patient Account Number: [id number] Date of Birth/Sex: 05-Jun-1955 (64 y.o. M) Treating RN: Dolan Amen Primary Care Provider: Cyndia Skeeters Other Clinician: Referring Provider: Nobie Putnam Treating Provider/Extender: Skipper Cliche in Treatment: 1 Verbal / Phone Orders: No Diagnosis Coding ICD-10 Coding Code Description S01.00XA Unspecified open wound of scalp, initial encounter L98.492 Non-pressure chronic ulcer of skin of other sites with fat layer exposed E11.622 Type 2 diabetes mellitus with other skin ulcer I10 Essential (primary) hypertension Follow-up Appointments o Return Appointment in 1 week. o Nurse Visit as needed Bathing/ Shower/ Hygiene o Clean wound with Normal Saline or wound cleanser. o May shower with wound dressing protected with water repellent cover or cast protector. Wound Treatment Wound #1 - Head - Parietal Wound Laterality: Lateral Cleanser: Byram Ancillary Kit - 15 Day Supply (Generic) 3 x Per Week/30 Days Discharge Instructions: Use supplies as instructed; Kit contains: (15) Saline Bullets; (15) 3x3 Gauze; 15 pr Gloves Cleanser: Normal Saline (Generic) 3 x Per Week/30 Days Discharge Instructions: Wash your hands with soap and water. Remove old dressing, discard into plastic bag and place into trash. Cleanse the wound with Normal Saline prior to applying a clean dressing using gauze sponges, not tissues or cotton balls. Do not scrub or use excessive force. Pat dry using gauze sponges, not tissue or cotton balls. Primary Dressing: Hydrofera Blue Ready Transfer Foam, 2.5x2.5 (in/in)  (Generic) 3 x Per Week/30 Days Discharge Instructions: Apply Hydrofera Blue Ready to wound bed as directed Secondary Dressing: Mepilex Border Flex, 4x4 (in/in) (Generic) 3 x Per Week/30 Days Discharge Instructions: Apply to wound as directed. Do not cut. Secured With: 106M Medipore H Soft Cloth Surgical Tape, 2x2 (in/yd) (Generic) 3 x Per Week/30 Days Wound #2 - Head - Parietal  Wound Laterality: Midline Cleanser: Byram Ancillary Kit - 15 Day Supply (Generic) 3 x Per Week/30 Days Discharge Instructions: Use supplies as instructed; Kit contains: (15) Saline Bullets; (15) 3x3 Gauze; 15 pr Gloves Cleanser: Normal Saline (Generic) 3 x Per Week/30 Days Discharge Instructions: Wash your hands with soap and water. Remove old dressing, discard into plastic bag and place into trash. Cleanse the wound with Normal Saline prior to applying a clean dressing using gauze sponges, not tissues or cotton balls. Do not scrub or use excessive force. Pat dry using gauze sponges, not tissue or cotton balls. Primary Dressing: Hydrofera Blue Ready Transfer Foam, 2.5x2.5 (in/in) (Generic) 3 x Per Week/30 Days Discharge Instructions: Apply Hydrofera Blue Ready to wound bed as directed Secondary Dressing: Mepilex Border Flex, 4x4 (in/in) (Generic) 3 x Per Week/30 Days Discharge Instructions: Apply to wound as directed. Do not cut. Secured With: 60M Medipore H Soft Cloth Surgical Tape, 2x2 (in/yd) (Generic) 3 x Per Week/30 Days Laboratory ROWIN, BAYRON ([id number]) o Bacteria identified in Tissue by Biopsy culture (MICRO) - (ICD10 S01.00XA - Unspecified open wound of scalp, initial encounter) oooo LOINC Code: 76811-5 oooo Convenience Name: Biopsy specimen culture Electronic Signature(s) Signed: 06/04/2020 4:50:07 PM By: Charlett Nose RN Signed: 06/04/2020 5:45:21 PM By: Worthy Keeler PA-C Entered By: Georges Mouse, Minus Breeding on 06/04/2020 14:56:46 [name]  ([id number]) -------------------------------------------------------------------------------- Problem List Details Patient Name: [name] Date of Service: 06/04/2020 1:45 PM Medical Record Number: [id number] Patient Account Number: [id number] Date of Birth/Sex: [birth year] (64 y.o. M) Treating RN: Cornell Barman Primary Care Provider: Cyndia Skeeters Other Clinician: Referring Provider: Nobie Putnam Treating Provider/Extender: Skipper Cliche in Treatment: 1 Active Problems ICD-10 Encounter Code Description Active Date MDM Diagnosis S01.00XA Unspecified open wound of scalp, initial encounter 05/28/2020 No Yes L98.492 Non-pressure chronic ulcer of skin of other sites with fat layer exposed 05/28/2020 No Yes E11.622 Type 2 diabetes mellitus with other skin ulcer 05/28/2020 No Yes I10 Essential (primary) hypertension 05/28/2020 No Yes Inactive Problems Resolved Problems Electronic Signature(s) Signed: 06/04/2020 5:45:21 PM By: Worthy Keeler PA-C Entered By: Worthy Keeler on 06/04/2020 14:02:25 [name] ([id number]) -------------------------------------------------------------------------------- Progress Note Details Patient Name: [name] Date of Service: 06/04/2020 1:45 PM Medical Record Number: [id number] Patient Account Number: [id number] Date of Birth/Sex: [birth year] (64 y.o. M) Treating RN: Cornell Barman Primary Care Provider: Cyndia Skeeters Other Clinician: Referring Provider: Nobie Putnam Treating Provider/Extender: Skipper Cliche in Treatment: 1 Subjective Chief Complaint Information obtained from Patient Scalp traumatic ulcers History of Present Illness (HPI) 7.2 a1c occurred june 2021 05/28/2020 on evaluation today patient appears for initial inspection here in our clinic concerning issues been having with a wound over the scalp area. This has been present since June 2021 when he was working with his son on a cabinet and a shelf fell on his  head. Subsequently he tells me at this point that he has been using peroxide pretty much daily to clean the area and use an antibiotic ointment as well. With that being said he does have a family member that mentions up about the possibility of a skin cancer. I explained that there is a least a chance that there could be a concern at this point. With that being said I am also wondering if it just may be that he needs more appropriate wound care and that the wounds may heal much more effectively and quickly. Nonetheless I discussed with the patient that we may want to hold  off on the biopsy this week but keep it in consideration for next week if we do not see significant improvement over that period of 7 days. The patient is in agreement with that plan. With that being said we will continue to monitor for anything worsening and again I did also recommend for the patient currently that he watch out for any signs of overall worsening. If anything occurs dramatically over the next week for the same he would obviously let me know. The patient does have a history of diabetes with his most recent hemoglobin A1c being 7.2. He also does have a history of hypertension. 06/05/2019 upon evaluation today patient appears to be doing really about the same in regard to the wounds on his scalp. I did discuss with him last visit and I think it still true this time that we probably should go ahead and proceed with the biopsy since there is not a significant improvement. The patient voiced understanding and agreement. He would like to do what is needed to try to get this healed and under control. Objective Constitutional Well-nourished and well-hydrated in no acute distress. Vitals Time Taken: 1:30 PM, Height: 66 in, Weight: 160 lbs, BMI: 25.8, Temperature: 97.8 F, Pulse: 84 bpm, Respiratory Rate: 16 breaths/min, Blood Pressure: 150/82 mmHg. Respiratory normal breathing without difficulty. Psychiatric this patient  is able to make decisions and demonstrates good insight into disease process. Alert and Oriented x 3. pleasant and cooperative. General Notes: I therefore did go ahead and proceed with a punch biopsy today from the wound margin of the patient's scalp. He actually tolerated that with minimal bleeding once I numbed him with lidocaine 2%. Fortunately he did not have much pain at all and post sample collection he seemed to also be doing quite well. No silver nitrate was needed for chemical cauterization I was able to secure this with pressure only. Integumentary (Hair, Skin) Wound #1 status is Open. Original cause of wound was Trauma. The wound is located on the Lateral Head - Parietal. The wound measures 1.6cm length x 1.6cm width x 0.2cm depth; 2.011cm^2 area and 0.402cm^3 volume. There is Fat Layer (Subcutaneous Tissue) exposed. There is no tunneling or undermining noted. There is a medium amount of serosanguineous drainage noted. There is large (67-100%) red granulation within the wound bed. There is a small (1-33%) amount of necrotic tissue within the wound bed including Adherent Slough. Wound #2 status is Open. Original cause of wound was Trauma. The wound is located on the Midline Head - Parietal. The wound measures 0.7cm length x 1.1cm width x 0.1cm depth; 0.605cm^2 area and 0.06cm^3 volume. There is no tunneling or undermining noted. There is a medium amount of serosanguineous drainage noted. There is large (67-100%) red granulation within the wound bed. There is no necrotic tissue within the wound bed. GRAESON, NOURI ([id number]) Assessment Active Problems ICD-10 Unspecified open wound of scalp, initial encounter Non-pressure chronic ulcer of skin of other sites with fat layer exposed Type 2 diabetes mellitus with other skin ulcer Essential (primary) hypertension Procedures Wound #1 Pre-procedure diagnosis of Wound #1 is an Atypical located on the Lateral Head - Parietal . There was a  biopsy performed by Tommie Sams., PA-C. There was a biopsy performed on Wound Bed. The skin was cleansed and prepped with anti-septic followed by pain control using Lidocaine Injectable: 2%. Utilizing a 5 mm tissue punch, tissue was removed at its base with the following instrument(s): Forceps and Scissors and sent to pathology.  A Moderate amount of bleeding was controlled with Pressure. A time out was conducted at 14:36, prior to the start of the procedure. The procedure was tolerated well with a pain level of 0 throughout and a pain level of 0 following the procedure. Post procedure Diagnosis Wound #1: Same as Pre-Procedure Plan Follow-up Appointments: Return Appointment in 1 week. Nurse Visit as needed Bathing/ Shower/ Hygiene: Clean wound with Normal Saline or wound cleanser. May shower with wound dressing protected with water repellent cover or cast protector. Laboratory ordered were: Biopsy specimen culture WOUND #1: - Head - Parietal Wound Laterality: Lateral Cleanser: Byram Ancillary Kit - 15 Day Supply (Generic) 3 x Per Week/30 Days Discharge Instructions: Use supplies as instructed; Kit contains: (15) Saline Bullets; (15) 3x3 Gauze; 15 pr Gloves Cleanser: Normal Saline (Generic) 3 x Per Week/30 Days Discharge Instructions: Wash your hands with soap and water. Remove old dressing, discard into plastic bag and place into trash. Cleanse the wound with Normal Saline prior to applying a clean dressing using gauze sponges, not tissues or cotton balls. Do not scrub or use excessive force. Pat dry using gauze sponges, not tissue or cotton balls. Primary Dressing: Hydrofera Blue Ready Transfer Foam, 2.5x2.5 (in/in) (Generic) 3 x Per Week/30 Days Discharge Instructions: Apply Hydrofera Blue Ready to wound bed as directed Secondary Dressing: Mepilex Border Flex, 4x4 (in/in) (Generic) 3 x Per Week/30 Days Discharge Instructions: Apply to wound as directed. Do not cut. Secured With: 25M  Medipore H Soft Cloth Surgical Tape, 2x2 (in/yd) (Generic) 3 x Per Week/30 Days WOUND #2: - Head - Parietal Wound Laterality: Midline Cleanser: Byram Ancillary Kit - 15 Day Supply (Generic) 3 x Per Week/30 Days Discharge Instructions: Use supplies as instructed; Kit contains: (15) Saline Bullets; (15) 3x3 Gauze; 15 pr Gloves Cleanser: Normal Saline (Generic) 3 x Per Week/30 Days Discharge Instructions: Wash your hands with soap and water. Remove old dressing, discard into plastic bag and place into trash. Cleanse the wound with Normal Saline prior to applying a clean dressing using gauze sponges, not tissues or cotton balls. Do not scrub or use excessive force. Pat dry using gauze sponges, not tissue or cotton balls. Primary Dressing: Hydrofera Blue Ready Transfer Foam, 2.5x2.5 (in/in) (Generic) 3 x Per Week/30 Days Discharge Instructions: Apply Hydrofera Blue Ready to wound bed as directed Secondary Dressing: Mepilex Border Flex, 4x4 (in/in) (Generic) 3 x Per Week/30 Days Discharge Instructions: Apply to wound as directed. Do not cut. Secured With: 25M Medipore H Soft Cloth Surgical Tape, 2x2 (in/yd) (Generic) 3 x Per Week/30 Days 1. Would recommend currently that we actually go and continue with the [facility] I think that still a good option for him and the patient is in agreement with plan. 2. I am also can recommend that we have the patient continue to clean this with dressing changes he is not showering at this point. LANSING, SIGMON E. ([id number]) 3. We will also see what the results of the pathology show. Depending on that result if there is anything untoward that we need to address we will do so making recommendations as needed. We will see patient back for reevaluation in 1 week here in the clinic. If anything worsens or changes patient will contact our office for additional recommendations. Electronic Signature(s) Signed: 06/04/2020 5:27:14 PM By: Worthy Keeler PA-C Entered By:  Worthy Keeler on 06/04/2020 17:27:13 [name] ([id number]) -------------------------------------------------------------------------------- SuperBill Details Patient Name: [name] Date of Service: 06/04/2020 Medical Record Number: [id number]  Patient Account Number: [id number] Date of Birth/Sex: June 02, 1955 (64 y.o. M) Treating RN: Dolan Amen Primary Care Provider: Cyndia Skeeters Other Clinician: Referring Provider: Nobie Putnam Treating Provider/Extender: Skipper Cliche in Treatment: 1 Diagnosis Coding ICD-10 Codes Code Description S01.00XA Unspecified open wound of scalp, initial encounter L98.492 Non-pressure chronic ulcer of skin of other sites with fat layer exposed E11.622 Type 2 diabetes mellitus with other skin ulcer I10 Essential (primary) hypertension Facility Procedures CPT4 Code: 10301314 Description: 99213 - WOUND CARE VISIT-LEV 3 EST PT Modifier: Quantity: 1 CPT4 Code: 38887579 Description: 11104-Punch biopsy of skin (including simple closure, when performed) single lesion Modifier: Quantity: 1 CPT4 Code: Description: ICD-10 Diagnosis Description S01.00XA Unspecified open wound of scalp, initial encounter Modifier: Quantity: Physician Procedures CPT4 Code: 7282060 Description: 99213 - WC PHYS LEVEL 3 - EST PT Modifier: 25 Quantity: 1 CPT4 Code: Description: ICD-10 Diagnosis Description S01.00XA Unspecified open wound of scalp, initial encounter L98.492 Non-pressure chronic ulcer of skin of other sites with fat layer exposed E11.622 Type 2 diabetes mellitus with other skin ulcer I10 Essential  (primary) hypertension Modifier: Quantity: CPT4 Code: 11104 Description: Punch biopsy of skin (including simple closure, when performed) single lesion Modifier: Quantity: 1 CPT4 Code: Description: ICD-10 Diagnosis Description S01.00XA Unspecified open wound of scalp, initial encounter Modifier: Quantity: Electronic  Signature(s) Signed: 06/04/2020 5:27:53 PM By: Worthy Keeler PA-C Previous Signature: 06/04/2020 5:27:40 PM Version By: Worthy Keeler PA-C Previous Signature: 06/04/2020 4:44:13 PM Version By: Georges Mouse, Minus Breeding RN Entered By: Worthy Keeler on 06/04/2020 17:27:52
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
## 2020-06-11 ENCOUNTER — Ambulatory Visit: Payer: Medicare Other | Admitting: Physician Assistant

## 2020-06-11 ENCOUNTER — Other Ambulatory Visit: Payer: Medicare Other | Admitting: Physician Assistant

## 2020-06-13 ENCOUNTER — Other Ambulatory Visit: Payer: Self-pay

## 2020-06-13 ENCOUNTER — Encounter: Payer: Medicare Other | Admitting: Internal Medicine

## 2020-06-13 DIAGNOSIS — S0100XA Unspecified open wound of scalp, initial encounter: Secondary | ICD-10-CM | POA: Diagnosis not present

## 2020-06-13 DIAGNOSIS — L98492 Non-pressure chronic ulcer of skin of other sites with fat layer exposed: Secondary | ICD-10-CM | POA: Diagnosis not present

## 2020-06-13 DIAGNOSIS — E11622 Type 2 diabetes mellitus with other skin ulcer: Secondary | ICD-10-CM | POA: Diagnosis not present

## 2020-06-13 DIAGNOSIS — C4442 Squamous cell carcinoma of skin of scalp and neck: Secondary | ICD-10-CM | POA: Diagnosis not present

## 2020-06-14 NOTE — Progress Notes (Signed)
[name], [name] ([id number]) Visit Report for 06/13/2020 HPI Details Patient Name: [name], [name]. Date of Service: 06/13/2020 3:00 PM Medical Record Number: [id number] Patient Account Number: [id number] Date of Birth/Sex: 03-23-1956 (65 y.o. M) Treating RN: Cornell Barman Primary Care Provider: Cyndia Skeeters Other Clinician: Referring Provider: Cyndia Skeeters Treating Provider/Extender: Ricard Dillon Weeks in Treatment: 2 History of Present Illness HPI Description: 7.2 a1c occurred june 2021 05/28/2020 on evaluation today patient appears for initial inspection here in our clinic concerning issues been having with a wound over the scalp area. This has been present since June 2021 when he was working with his son on a cabinet and a shelf fell on his head. Subsequently he tells me at this point that he has been using peroxide pretty much daily to clean the area and use an antibiotic ointment as well. With that being said he does have a family member that mentions up about the possibility of a skin cancer. I explained that there is a least a chance that there could be a concern at this point. With that being said I am also wondering if it just may be that he needs more appropriate wound care and that the wounds may heal much more effectively and quickly. Nonetheless I discussed with the patient that we may want to hold off on the biopsy this week but keep it in consideration for next week if we do not see significant improvement over that period of 7 days. The patient is in agreement with that plan. With that being said we will continue to monitor for anything worsening and again I did also recommend for the patient currently that he watch out for any signs of overall worsening. If anything occurs dramatically over the next week for the same he would obviously let me know. The patient does have a history of diabetes with his most recent hemoglobin A1c being 7.2. He also does have a history of  hypertension. 06/04/2020 upon evaluation today patient appears to be doing really about the same in regard to the wounds on his scalp. I did discuss with him last visit and I think it still true this time that we probably should go ahead and proceed with the biopsy since there is not a significant improvement. The patient voiced understanding and agreement. He would like to do what is needed to try to get this healed and under control. 1/19; patient was admitted the clinic 2 weeks ago. He has 2 wounds over the occiput of his scalp just to the left of midline. The history here is that he traumatized this in June when something fell off a shelf onto his head while he was working on this. What is disturbing is that the wounds themselves have expanded since then. We used Hydrofera Blue on this last week and a shave biopsy was done of the larger area HOWEVER although we can document that the biopsy that was done last week left lower clinic it was apparently not received by the pathology department. We are in the process of tracking this down now and we explained this to the patient. Electronic Signature(s) Signed: 06/14/2020 1:16:15 PM By: Linton Ham MD Entered By: Linton Ham on 06/13/2020 15:43:18 [name] ([id number]) -------------------------------------------------------------------------------- Physical Exam Details Patient Name: [name] Date of Service: 06/13/2020 3:00 PM Medical Record Number: [id number] Patient Account Number: [id number] Date of Birth/Sex: February 17, 1956 (65 y.o. M) Treating RN: Cornell Barman Primary Care Provider: Cyndia Skeeters Other Clinician: Referring Provider: Lorine Bears,  NIcole Treating Provider/Extender: Ricard Dillon Weeks in Treatment: 2 Constitutional Sitting or standing Blood Pressure is within target range for patient.. Pulse regular and within target range for patient.[name] Kitchen Respirations regular, non- labored and within target range.. Temperature is  normal and within the target range for the patient.[name] Kitchen appears in no distress. Notes Wound exam; he had a punch biopsy done of the larger wound margin. I cannot really even tell where that was done this week. The area on the left there is a circular shaped wound with some depth. Surface of this wound does not look too terrible however very friable. He has a more filled in wound to the right of this but even then the surface of this does not look completely healed. There is no evidence of infection here Electronic Signature(s) Signed: 06/14/2020 1:16:15 PM By: Linton Ham MD Entered By: Linton Ham on 06/13/2020 15:45:08 [name] ([id number]) -------------------------------------------------------------------------------- Physician Orders Details Patient Name: [name] Date of Service: 06/13/2020 3:00 PM Medical Record Number: [id number] Patient Account Number: [id number] Date of Birth/Sex: 1956-05-15 (65 y.o. M) Treating RN: Cornell Barman Primary Care Provider: Cyndia Skeeters Other Clinician: Referring Provider: Cyndia Skeeters Treating Provider/Extender: Tito Dine in Treatment: 2 Verbal / Phone Orders: No Diagnosis Coding Follow-up Appointments o Return Appointment in 1 week. o Nurse Visit as needed Bathing/ Shower/ Hygiene o Clean wound with Normal Saline or wound cleanser. Wound Treatment Wound #1 - Head - Parietal Wound Laterality: Lateral Cleanser: Byram Ancillary Kit - 15 Day Supply (Generic) 3 x Per Week/30 Days Discharge Instructions: Use supplies as instructed; Kit contains: (15) Saline Bullets; (15) 3x3 Gauze; 15 pr Gloves Cleanser: Normal Saline (Generic) 3 x Per Week/30 Days Discharge Instructions: Wash your hands with soap and water. Remove old dressing, discard into plastic bag and place into trash. Cleanse the wound with Normal Saline prior to applying a clean dressing using gauze sponges, not tissues or cotton balls. Do not scrub or use  excessive force. Pat dry using gauze sponges, not tissue or cotton balls. Primary Dressing: Hydrofera Blue Ready Transfer Foam, 2.5x2.5 (in/in) (Generic) 3 x Per Week/30 Days Discharge Instructions: Apply Hydrofera Blue Ready to wound bed as directed Secured With: Tegaderm Film 4x4 (in/in) 3 x Per Week/30 Days Discharge Instructions: Apply to wound bed Wound #2 - Head - Parietal Wound Laterality: Midline Cleanser: Normal Saline (Generic) 3 x Per Week/30 Days Discharge Instructions: Wash your hands with soap and water. Remove old dressing, discard into plastic bag and place into trash. Cleanse the wound with Normal Saline prior to applying a clean dressing using gauze sponges, not tissues or cotton balls. Do not scrub or use excessive force. Pat dry using gauze sponges, not tissue or cotton balls. Primary Dressing: Hydrofera Blue Ready Transfer Foam, 2.5x2.5 (in/in) (Generic) 3 x Per Week/30 Days Discharge Instructions: Apply Hydrofera Blue Ready to wound bed as directed Secured With: Tegaderm Film 4x4 (in/in) 3 x Per Week/30 Days Discharge Instructions: Apply to wound bed Electronic Signature(s) Signed: 06/14/2020 8:41:15 AM By: Gretta Cool, BSN, RN, CWS, Kim RN, BSN Signed: 06/14/2020 1:16:15 PM By: Linton Ham MD Entered By: Gretta Cool, BSN, RN, CWS, Kim on 06/13/2020 15:31:02 [name] ([id number]) -------------------------------------------------------------------------------- Problem List Details Patient Name: [name], [name]. Date of Service: 06/13/2020 3:00 PM Medical Record Number: [id number] Patient Account Number: [id number] Date of Birth/Sex: 04-Sep-1955 (64 y.o. M) Treating RN: Cornell Barman Primary Care Provider: Cyndia Skeeters Other Clinician: Referring Provider: Cyndia Skeeters Treating Provider/Extender: Tito Dine  in Treatment: 2 Active Problems ICD-10 Encounter Code Description Active Date MDM Diagnosis S01.00XA Unspecified open wound of scalp, initial encounter  05/28/2020 No Yes L98.492 Non-pressure chronic ulcer of skin of other sites with fat layer exposed 05/28/2020 No Yes E11.622 Type 2 diabetes mellitus with other skin ulcer 05/28/2020 No Yes I10 Essential (primary) hypertension 05/28/2020 No Yes Inactive Problems Resolved Problems Electronic Signature(s) Signed: 06/14/2020 1:16:15 PM By: Linton Ham MD Entered By: Linton Ham on 06/13/2020 15:41:09 [name] ([id number]) -------------------------------------------------------------------------------- Progress Note Details Patient Name: [name] Date of Service: 06/13/2020 3:00 PM Medical Record Number: [id number] Patient Account Number: [id number] Date of Birth/Sex: 01/01/1956 (64 y.o. M) Treating RN: Cornell Barman Primary Care Provider: Cyndia Skeeters Other Clinician: Referring Provider: Cyndia Skeeters Treating Provider/Extender: Ricard Dillon Weeks in Treatment: 2 Subjective History of Present Illness (HPI) 7.2 a1c occurred june 2021 05/28/2020 on evaluation today patient appears for initial inspection here in our clinic concerning issues been having with a wound over the scalp area. This has been present since June 2021 when he was working with his son on a cabinet and a shelf fell on his head. Subsequently he tells me at this point that he has been using peroxide pretty much daily to clean the area and use an antibiotic ointment as well. With that being said he does have a family member that mentions up about the possibility of a skin cancer. I explained that there is a least a chance that there could be a concern at this point. With that being said I am also wondering if it just may be that he needs more appropriate wound care and that the wounds may heal much more effectively and quickly. Nonetheless I discussed with the patient that we may want to hold off on the biopsy this week but keep it in consideration for next week if we do not see significant improvement over that  period of 7 days. The patient is in agreement with that plan. With that being said we will continue to monitor for anything worsening and again I did also recommend for the patient currently that he watch out for any signs of overall worsening. If anything occurs dramatically over the next week for the same he would obviously let me know. The patient does have a history of diabetes with his most recent hemoglobin A1c being 7.2. He also does have a history of hypertension. 06/04/2020 upon evaluation today patient appears to be doing really about the same in regard to the wounds on his scalp. I did discuss with him last visit and I think it still true this time that we probably should go ahead and proceed with the biopsy since there is not a significant improvement. The patient voiced understanding and agreement. He would like to do what is needed to try to get this healed and under control. 1/19; patient was admitted the clinic 2 weeks ago. He has 2 wounds over the occiput of his scalp just to the left of midline. The history here is that he traumatized this in June when something fell off a shelf onto his head while he was working on this. What is disturbing is that the wounds themselves have expanded since then. We used Hydrofera Blue on this last week and a shave biopsy was done of the larger area HOWEVER although we can document that the biopsy that was done last week left lower clinic it was apparently not received by the pathology department. We are in  the process of tracking this down now and we explained this to the patient. Objective Constitutional Sitting or standing Blood Pressure is within target range for patient.. Pulse regular and within target range for patient.[name] Kitchen Respirations regular, non- labored and within target range.. Temperature is normal and within the target range for the patient.[name] Kitchen appears in no distress. Vitals Time Taken: 3:03 PM, Weight: 160 lbs, Temperature: 98.5 F, Pulse:  72 bpm, Respiratory Rate: 18 breaths/min, Blood Pressure: 112/73 mmHg. General Notes: Wound exam; he had a punch biopsy done of the larger wound margin. I cannot really even tell where that was done this week. The area on the left there is a circular shaped wound with some depth. Surface of this wound does not look too terrible however very friable. He has a more filled in wound to the right of this but even then the surface of this does not look completely healed. There is no evidence of infection here Integumentary (Hair, Skin) Wound #1 status is Open. Original cause of wound was Trauma. The wound is located on the Lateral Head - Parietal. The wound measures 1.5cm length x 1.7cm width x 0.2cm depth; 2.003cm^2 area and 0.401cm^3 volume. There is Fat Layer (Subcutaneous Tissue) exposed. There is no tunneling or undermining noted. There is a medium amount of serosanguineous drainage noted. There is large (67-100%) red granulation within the wound bed. There is a small (1-33%) amount of necrotic tissue within the wound bed including Adherent Slough. Wound #2 status is Open. Original cause of wound was Trauma. The wound is located on the Midline Head - Parietal. The wound measures 1cm length x 1cm width x 0.1cm depth; 0.785cm^2 area and 0.079cm^3 volume. There is a medium amount of serosanguineous drainage noted. There is large (67-100%) red granulation within the wound bed. There is no necrotic tissue within the wound bed. [name], [name] ([id number]) Assessment Active Problems ICD-10 Unspecified open wound of scalp, initial encounter Non-pressure chronic ulcer of skin of other sites with fat layer exposed Type 2 diabetes mellitus with other skin ulcer Essential (primary) hypertension Plan Follow-up Appointments: Return Appointment in 1 week. Nurse Visit as needed Bathing/ Shower/ Hygiene: Clean wound with Normal Saline or wound cleanser. WOUND #1: - Head - Parietal Wound Laterality:  Lateral Cleanser: Byram Ancillary Kit - 15 Day Supply (Generic) 3 x Per Week/30 Days Discharge Instructions: Use supplies as instructed; Kit contains: (15) Saline Bullets; (15) 3x3 Gauze; 15 pr Gloves Cleanser: Normal Saline (Generic) 3 x Per Week/30 Days Discharge Instructions: Wash your hands with soap and water. Remove old dressing, discard into plastic bag and place into trash. Cleanse the wound with Normal Saline prior to applying a clean dressing using gauze sponges, not tissues or cotton balls. Do not scrub or use excessive force. Pat dry using gauze sponges, not tissue or cotton balls. Primary Dressing: Hydrofera Blue Ready Transfer Foam, 2.5x2.5 (in/in) (Generic) 3 x Per Week/30 Days Discharge Instructions: Apply Hydrofera Blue Ready to wound bed as directed Secured With: Tegaderm Film 4x4 (in/in) 3 x Per Week/30 Days Discharge Instructions: Apply to wound bed WOUND #2: - Head - Parietal Wound Laterality: Midline Cleanser: Normal Saline (Generic) 3 x Per Week/30 Days Discharge Instructions: Wash your hands with soap and water. Remove old dressing, discard into plastic bag and place into trash. Cleanse the wound with Normal Saline prior to applying a clean dressing using gauze sponges, not tissues or cotton balls. Do not scrub or use excessive force. Pat dry using gauze sponges, not  tissue or cotton balls. Primary Dressing: Hydrofera Blue Ready Transfer Foam, 2.5x2.5 (in/in) (Generic) 3 x Per Week/30 Days Discharge Instructions: Apply Hydrofera Blue Ready to wound bed as directed Secured With: Tegaderm Film 4x4 (in/in) 3 x Per Week/30 Days Discharge Instructions: Apply to wound bed 1. I continued to [facility] Blue 2. This should just be to the wound bed 3. If we have not located the previous biopsy specimen by next week I think the biopsy will need to be repeated. We explained this to the patient Electronic Signature(s) Signed: 06/14/2020 1:16:15 PM By: Linton Ham MD Entered By:  Linton Ham on 06/13/2020 15:45:48 [name] ([id number]) -------------------------------------------------------------------------------- [place] Details Patient Name: [name] Date of Service: 06/13/2020 Medical Record Number: [id number] Patient Account Number: [id number] Date of Birth/Sex: May 13, 1956 (64 y.o. M) Treating RN: Cornell Barman Primary Care Provider: Cyndia Skeeters Other Clinician: Referring Provider: Cyndia Skeeters Treating Provider/Extender: Tito Dine in Treatment: 2 Diagnosis Coding ICD-10 Codes Code Description S01.00XA Unspecified open wound of scalp, initial encounter L98.492 Non-pressure chronic ulcer of skin of other sites with fat layer exposed E11.622 Type 2 diabetes mellitus with other skin ulcer I10 Essential (primary) hypertension Facility Procedures CPT4 Code: [id number] Description: 99213 - WOUND CARE VISIT-LEV 3 EST PT Modifier: Quantity: 1 Physician Procedures CPT4 Code: [id number] Description: 38250 - WC PHYS LEVEL 3 - EST PT Modifier: Quantity: 1 CPT4 Code: Description: ICD-10 Diagnosis Description S01.00XA Unspecified open wound of scalp, initial encounter L98.492 Non-pressure chronic ulcer of skin of other sites with fat layer expos Modifier: ed Quantity: Electronic Signature(s) Signed: 06/14/2020 1:16:15 PM By: Linton Ham MD Entered By: Linton Ham on 06/13/2020 15:46:10
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
## 2020-06-18 ENCOUNTER — Ambulatory Visit: Payer: Medicare Other | Admitting: Physician Assistant

## 2020-06-21 ENCOUNTER — Other Ambulatory Visit: Payer: Self-pay | Admitting: Physician Assistant

## 2020-06-21 ENCOUNTER — Encounter: Payer: Medicare Other | Admitting: Physician Assistant

## 2020-06-21 ENCOUNTER — Other Ambulatory Visit: Payer: Self-pay

## 2020-06-21 DIAGNOSIS — L98492 Non-pressure chronic ulcer of skin of other sites with fat layer exposed: Secondary | ICD-10-CM | POA: Diagnosis not present

## 2020-06-21 DIAGNOSIS — C4442 Squamous cell carcinoma of skin of scalp and neck: Secondary | ICD-10-CM | POA: Diagnosis not present

## 2020-06-21 DIAGNOSIS — S0100XA Unspecified open wound of scalp, initial encounter: Secondary | ICD-10-CM | POA: Diagnosis not present

## 2020-06-21 DIAGNOSIS — E11622 Type 2 diabetes mellitus with other skin ulcer: Secondary | ICD-10-CM | POA: Diagnosis not present

## 2020-06-21 NOTE — Progress Notes (Addendum)
LEJUAN, BOTTO ([id number]) Visit Report for 06/21/2020 Biopsy Details Patient Name: [name], [name]. Date of Service: 06/21/2020 1:45 PM Medical Record Number: [id number] Patient Account Number: [id number] Date of Birth/Sex: 26-Sep-1955 (64 y.o. M) Treating RN: Dolan Amen Primary Care Provider: Cyndia Skeeters Other Clinician: Referring Provider: Cyndia Skeeters Treating Provider/Extender: Skipper Cliche in Treatment: 3 Biopsy Performed for: Wound #1 Lateral Head - Parietal Location(s): Wound Bed Performed By: Physician Tommie Sams., PA-C Tissue Punch: Yes Size (mm): 5 Number of Specimens Taken: 1 Specimen Sent To Pathology: Yes Level of Consciousness (Pre-procedure): Awake and Alert Pre-procedure Verification/Time-Out Taken: Yes - 15:00 Pain Control: Lidocaine Injectable Lidocaine Percent: 2% Instrument: Forceps, Scissors Hemostasis Achieved: Pressure Response to Treatment: Procedure was tolerated well Level of Consciousness (Post-procedure): Awake and Alert Post Procedure Diagnosis Same as Pre-procedure Electronic Signature(s) Signed: 06/21/2020 5:24:05 PM By: Worthy Keeler PA-C Signed: 06/21/2020 5:27:46 PM By: Georges Mouse, Minus Breeding RN Entered By: Georges Mouse, Minus Breeding on 06/21/2020 15:19:35 [name] ([id number]) -------------------------------------------------------------------------------- Chief Complaint Document Details Patient Name: [name]. Date of Service: 06/21/2020 1:45 PM Medical Record Number: [id number] Patient Account Number: [id number] Date of Birth/Sex: 19-Apr-1956 (64 y.o. M) Treating RN: Cornell Barman Primary Care Provider: Cyndia Skeeters Other Clinician: Referring Provider: Cyndia Skeeters Treating Provider/Extender: Skipper Cliche in Treatment: 3 Information Obtained from: Patient Chief Complaint Scalp traumatic ulcers Electronic Signature(s) Signed: 06/21/2020 1:55:07 PM By: Worthy Keeler PA-C Entered By: Worthy Keeler on  06/21/2020 13:55:07 [name] ([id number]) -------------------------------------------------------------------------------- HPI Details Patient Name: [name] Date of Service: 06/21/2020 1:45 PM Medical Record Number: [id number] Patient Account Number: [id number] Date of Birth/Sex: 12-20-1955 (64 y.o. M) Treating RN: Cornell Barman Primary Care Provider: Cyndia Skeeters Other Clinician: Referring Provider: Cyndia Skeeters Treating Provider/Extender: Skipper Cliche in Treatment: 3 History of Present Illness HPI Description: 7.2 a1c occurred june 2021 05/28/2020 on evaluation today patient appears for initial inspection here in our clinic concerning issues been having with a wound over the scalp area. This has been present since June 2021 when he was working with his son on a cabinet and a shelf fell on his head. Subsequently he tells me at this point that he has been using peroxide pretty much daily to clean the area and use an antibiotic ointment as well. With that being said he does have a family member that mentions up about the possibility of a skin cancer. I explained that there is a least a chance that there could be a concern at this point. With that being said I am also wondering if it just may be that he needs more appropriate wound care and that the wounds may heal much more effectively and quickly. Nonetheless I discussed with the patient that we may want to hold off on the biopsy this week but keep it in consideration for next week if we do not see significant improvement over that period of 7 days. The patient is in agreement with that plan. With that being said we will continue to monitor for anything worsening and again I did also recommend for the patient currently that he watch out for any signs of overall worsening. If anything occurs dramatically over the next week for the same he would obviously let me know. The patient does have a history of diabetes with his most  recent hemoglobin A1c being 7.2. He also does have a history of hypertension. 06/04/2020 upon evaluation today patient appears to be doing really about the same  in regard to the wounds on his scalp. I did discuss with him last visit and I think it still true this time that we probably should go ahead and proceed with the biopsy since there is not a significant improvement. The patient voiced understanding and agreement. He would like to do what is needed to try to get this healed and under control. 1/19; patient was admitted the clinic 2 weeks ago. He has 2 wounds over the occiput of his scalp just to the left of midline. The history here is that he traumatized this in June when something fell off a shelf onto his head while he was working on this. What is disturbing is that the wounds themselves have expanded since then. We used Hydrofera Blue on this last week and a shave biopsy was done of the larger area HOWEVER although we can document that the biopsy that was done last week left lower clinic it was apparently not received by the pathology department. We are in the process of tracking this down now and we explained this to the patient. 06/21/2020 upon evaluation today patient appears to be doing about the same in regard to his scalp ulcer. Again unfortunately after we performed the biopsy and sent it to the hospital this was picked up by the volunteered to be delivered to the pathology lab unfortunately this never occurred. We were unable to track down the specimen I am not sure what happened once it left our clinic. Nonetheless the patient is okay with me performing a second biopsy so that we can have it identify if there is any issues specifically with a skin cancer that could be occurring at this point. Obviously that is my biggest concern right now. Electronic Signature(s) Signed: 06/21/2020 3:50:02 PM By: Worthy Keeler PA-C Entered By: Worthy Keeler on 06/21/2020 15:50:02 [name]  ([id number]) -------------------------------------------------------------------------------- Physical Exam Details Patient Name: [name] Date of Service: 06/21/2020 1:45 PM Medical Record Number: [id number] Patient Account Number: [id number] Date of Birth/Sex: 09/23/1955 (64 y.o. M) Treating RN: Cornell Barman Primary Care Provider: Cyndia Skeeters Other Clinician: Referring Provider: Cyndia Skeeters Treating Provider/Extender: Skipper Cliche in Treatment: 3 Constitutional Well-nourished and well-hydrated in no acute distress. Respiratory normal breathing without difficulty. Psychiatric this patient is able to make decisions and demonstrates good insight into disease process. Alert and Oriented x 3. pleasant and cooperative. Notes Upon inspection patient's wound bed actually showed signs of fairly good granulation and some areas but again he does not have a lot of epithelial growth and the still has a very unusual granulation bed despite the [facility] I just do not know that we are making the progress that I would really like to see at this point. That makes me concerned about something going on more significant here such as a skin cancer. Electronic Signature(s) Signed: 06/21/2020 3:50:28 PM By: Worthy Keeler PA-C Entered By: Worthy Keeler on 06/21/2020 15:50:28 [name] ([id number]) -------------------------------------------------------------------------------- Physician Orders Details Patient Name: [name] Date of Service: 06/21/2020 1:45 PM Medical Record Number: [id number] Patient Account Number: [id number] Date of Birth/Sex: 01-23-1956 (64 y.o. M) Treating RN: Dolan Amen Primary Care Provider: Cyndia Skeeters Other Clinician: Referring Provider: Cyndia Skeeters Treating Provider/Extender: Skipper Cliche in Treatment: 3 Verbal / Phone Orders: No Diagnosis Coding ICD-10 Coding Code Description S01.00XA Unspecified open wound of scalp, initial  encounter L98.492 Non-pressure chronic ulcer of skin of other sites with fat layer exposed E11.622 Type 2 diabetes  mellitus with other skin ulcer I10 Essential (primary) hypertension Follow-up Appointments o Return Appointment in 1 week. o Nurse Visit as needed Bathing/ Shower/ Hygiene o Clean wound with Normal Saline or wound cleanser. Wound Treatment Wound #1 - Head - Parietal Wound Laterality: Lateral Cleanser: Byram Ancillary Kit - 15 Day Supply (Generic) 3 x Per Week/30 Days Discharge Instructions: Use supplies as instructed; Kit contains: (15) Saline Bullets; (15) 3x3 Gauze; 15 pr Gloves Cleanser: Normal Saline (Generic) 3 x Per Week/30 Days Discharge Instructions: Wash your hands with soap and water. Remove old dressing, discard into plastic bag and place into trash. Cleanse the wound with Normal Saline prior to applying a clean dressing using gauze sponges, not tissues or cotton balls. Do not scrub or use excessive force. Pat dry using gauze sponges, not tissue or cotton balls. Primary Dressing: Hydrofera Blue Ready Transfer Foam, 2.5x2.5 (in/in) (Generic) 3 x Per Week/30 Days Discharge Instructions: Apply Hydrofera Blue Ready to wound bed as directed Secured With: Tegaderm Film 4x4 (in/in) 3 x Per Week/30 Days Discharge Instructions: Apply to wound bed Wound #2 - Head - Parietal Wound Laterality: Midline Cleanser: Normal Saline (Generic) 3 x Per Week/30 Days Discharge Instructions: Wash your hands with soap and water. Remove old dressing, discard into plastic bag and place into trash. Cleanse the wound with Normal Saline prior to applying a clean dressing using gauze sponges, not tissues or cotton balls. Do not scrub or use excessive force. Pat dry using gauze sponges, not tissue or cotton balls. Primary Dressing: Hydrofera Blue Ready Transfer Foam, 2.5x2.5 (in/in) (Generic) 3 x Per Week/30 Days Discharge Instructions: Apply Hydrofera Blue Ready to wound bed as  directed Secured With: Tegaderm Film 4x4 (in/in) 3 x Per Week/30 Days Discharge Instructions: Apply to wound bed Laboratory o Tissue Pathology biopsy report (PATH) oooo LOINC Code: 13086-5 oooo Convenience Name: Tiss Path Bx report Electronic Signature(s) STCLAIR, SZYMBORSKI ([id number]) Signed: 06/21/2020 5:24:05 PM By: Worthy Keeler PA-C Signed: 06/21/2020 5:27:46 PM By: Georges Mouse, Minus Breeding RN Entered By: Georges Mouse, Kenia on 06/21/2020 15:09:05 [name] ([id number]) -------------------------------------------------------------------------------- Problem List Details Patient Name: [name]. Date of Service: 06/21/2020 1:45 PM Medical Record Number: [id number] Patient Account Number: [id number] Date of Birth/Sex: 1956-04-14 (64 y.o. M) Treating RN: Cornell Barman Primary Care Provider: Cyndia Skeeters Other Clinician: Referring Provider: Cyndia Skeeters Treating Provider/Extender: Skipper Cliche in Treatment: 3 Active Problems ICD-10 Encounter Code Description Active Date MDM Diagnosis S01.00XA Unspecified open wound of scalp, initial encounter 05/28/2020 No Yes L98.492 Non-pressure chronic ulcer of skin of other sites with fat layer exposed 05/28/2020 No Yes E11.622 Type 2 diabetes mellitus with other skin ulcer 05/28/2020 No Yes I10 Essential (primary) hypertension 05/28/2020 No Yes Inactive Problems Resolved Problems Electronic Signature(s) Signed: 06/21/2020 1:55:02 PM By: Worthy Keeler PA-C Entered By: Worthy Keeler on 06/21/2020 13:55:01 [name] ([id number]) -------------------------------------------------------------------------------- Progress Note Details Patient Name: [name] Date of Service: 06/21/2020 1:45 PM Medical Record Number: [id number] Patient Account Number: [id number] Date of Birth/Sex: [birth year] (64 y.o. M) Treating RN: Cornell Barman Primary Care Provider: Cyndia Skeeters Other Clinician: Referring Provider: Cyndia Skeeters Treating Provider/Extender: Skipper Cliche in Treatment: 3 Subjective Chief Complaint Information obtained from Patient Scalp traumatic ulcers History of Present Illness (HPI) 7.2 a1c occurred june 2021 05/28/2020 on evaluation today patient appears for initial inspection here in our clinic concerning issues been having with a wound over the scalp area. This has been present since June 2021 when  he was working with his son on a cabinet and a shelf fell on his head. Subsequently he tells me at this point that he has been using peroxide pretty much daily to clean the area and use an antibiotic ointment as well. With that being said he does have a family member that mentions up about the possibility of a skin cancer. I explained that there is a least a chance that there could be a concern at this point. With that being said I am also wondering if it just may be that he needs more appropriate wound care and that the wounds may heal much more effectively and quickly. Nonetheless I discussed with the patient that we may want to hold off on the biopsy this week but keep it in consideration for next week if we do not see significant improvement over that period of 7 days. The patient is in agreement with that plan. With that being said we will continue to monitor for anything worsening and again I did also recommend for the patient currently that he watch out for any signs of overall worsening. If anything occurs dramatically over the next week for the same he would obviously let me know. The patient does have a history of diabetes with his most recent hemoglobin A1c being 7.2. He also does have a history of hypertension. 06/04/2020 upon evaluation today patient appears to be doing really about the same in regard to the wounds on his scalp. I did discuss with him last visit and I think it still true this time that we probably should go ahead and proceed with the biopsy since there is not a  significant improvement. The patient voiced understanding and agreement. He would like to do what is needed to try to get this healed and under control. 1/19; patient was admitted the clinic 2 weeks ago. He has 2 wounds over the occiput of his scalp just to the left of midline. The history here is that he traumatized this in June when something fell off a shelf onto his head while he was working on this. What is disturbing is that the wounds themselves have expanded since then. We used Hydrofera Blue on this last week and a shave biopsy was done of the larger area HOWEVER although we can document that the biopsy that was done last week left lower clinic it was apparently not received by the pathology department. We are in the process of tracking this down now and we explained this to the patient. 06/21/2020 upon evaluation today patient appears to be doing about the same in regard to his scalp ulcer. Again unfortunately after we performed the biopsy and sent it to the hospital this was picked up by the volunteered to be delivered to the pathology lab unfortunately this never occurred. We were unable to track down the specimen I am not sure what happened once it left our clinic. Nonetheless the patient is okay with me performing a second biopsy so that we can have it identify if there is any issues specifically with a skin cancer that could be occurring at this point. Obviously that is my biggest concern right now. Objective Constitutional Well-nourished and well-hydrated in no acute distress. Vitals Time Taken: 2:21 PM, Weight: 160 lbs, Temperature: 98.1 F, Pulse: 76 bpm, Respiratory Rate: 18 breaths/min, Blood Pressure: 118/78 mmHg. Respiratory normal breathing without difficulty. Psychiatric this patient is able to make decisions and demonstrates good insight into disease process. Alert and Oriented x 3. pleasant  and cooperative. General Notes: Upon inspection patient's wound bed actually  showed signs of fairly good granulation and some areas but again he does not have a lot of epithelial growth and the still has a very unusual granulation bed despite the Hydrofera Blue I just do not know that we are making the progress that I would really like to see at this point. That makes me concerned about something going on more significant here such as a skin cancer. [name], [name] ([id number]) Integumentary (Hair, Skin) Wound #1 status is Open. Original cause of wound was Trauma. The wound is located on the Lateral Head - Parietal. The wound measures 2cm length x 1.7cm width x 0.3cm depth; 2.67cm^2 area and 0.801cm^3 volume. There is Fat Layer (Subcutaneous Tissue) exposed. There is no tunneling or undermining noted. There is a medium amount of serosanguineous drainage noted. There is large (67-100%) red granulation within the wound bed. There is a small (1-33%) amount of necrotic tissue within the wound bed including Adherent Slough. Wound #2 status is Open. Original cause of wound was Trauma. The wound is located on the Midline Head - Parietal. The wound measures 1.2cm length x 1.3cm width x 0.1cm depth; 1.225cm^2 area and 0.123cm^3 volume. There is no tunneling or undermining noted. There is a medium amount of serosanguineous drainage noted. There is large (67-100%) red granulation within the wound bed. There is no necrotic tissue within the wound bed. Assessment Active Problems ICD-10 Unspecified open wound of scalp, initial encounter Non-pressure chronic ulcer of skin of other sites with fat layer exposed Type 2 diabetes mellitus with other skin ulcer Essential (primary) hypertension Procedures Wound #1 Pre-procedure diagnosis of Wound #1 is an Atypical located on the Lateral Head - Parietal . There was a biopsy performed by Tommie Sams., PA-C. There was a biopsy performed on Wound Bed. The skin was cleansed and prepped with anti-septic followed by pain control using  Lidocaine Injectable: 2%. Utilizing a 5 mm tissue punch, tissue was removed at its base with the following instrument(s): Forceps and Scissors and sent to pathology. A time out was conducted at 15:00, prior to the start of the procedure. The procedure was tolerated well. Post procedure Diagnosis Wound #1: Same as Pre-Procedure Plan Follow-up Appointments: Return Appointment in 1 week. Nurse Visit as needed Bathing/ Shower/ Hygiene: Clean wound with Normal Saline or wound cleanser. Laboratory ordered were: Tiss Path Bx report WOUND #1: - Head - Parietal Wound Laterality: Lateral Cleanser: Byram Ancillary Kit - 15 Day Supply (Generic) 3 x Per Week/30 Days Discharge Instructions: Use supplies as instructed; Kit contains: (15) Saline Bullets; (15) 3x3 Gauze; 15 pr Gloves Cleanser: Normal Saline (Generic) 3 x Per Week/30 Days Discharge Instructions: Wash your hands with soap and water. Remove old dressing, discard into plastic bag and place into trash. Cleanse the wound with Normal Saline prior to applying a clean dressing using gauze sponges, not tissues or cotton balls. Do not scrub or use excessive force. Pat dry using gauze sponges, not tissue or cotton balls. Primary Dressing: Hydrofera Blue Ready Transfer Foam, 2.5x2.5 (in/in) (Generic) 3 x Per Week/30 Days Discharge Instructions: Apply Hydrofera Blue Ready to wound bed as directed Secured With: Tegaderm Film 4x4 (in/in) 3 x Per Week/30 Days Discharge Instructions: Apply to wound bed WOUND #2: - Head - Parietal Wound Laterality: Midline Cleanser: Normal Saline (Generic) 3 x Per Week/30 Days Discharge Instructions: Wash your hands with soap and water. Remove old dressing, discard into plastic bag and place into  trash. Cleanse the wound with Normal Saline prior to applying a clean dressing using gauze sponges, not tissues or cotton balls. Do not scrub or use excessive force. Pat dry using gauze sponges, not tissue or cotton  balls. Primary Dressing: Hydrofera Blue Ready Transfer Foam, 2.5x2.5 (in/in) (Generic) 3 x Per Week/30 Days Discharge Instructions: Apply Hydrofera Blue Ready to wound bed as directed Secured With: Tegaderm Film 4x4 (in/in) 3 x Per Week/30 Days Discharge Instructions: Apply to wound bed [name], William E. ([id number]) 1. Would recommend currently that we go ahead and obtain a second biopsy which was done today I did send this biopsy for pathology. 2. I am also can recommend that we have the patient continue with the [facility] for the time being I think that still the best way to go. 3. I would also recommend that he continue to monitor for any signs of infection though I do not see anything right now that could change at any point. We will see patient back for reevaluation in 1 week here in the clinic. If anything worsens or changes patient will contact our office for additional recommendations. Electronic Signature(s) Signed: 06/21/2020 3:50:57 PM By: Worthy Keeler PA-C Entered By: Worthy Keeler on 06/21/2020 15:50:57 [name] ([id number]) -------------------------------------------------------------------------------- SuperBill Details Patient Name: [name] Date of Service: 06/21/2020 Medical Record Number: [id number] Patient Account Number: [id number] Date of Birth/Sex: May 05, 1956 (64 y.o. M) Treating RN: Cornell Barman Primary Care Provider: Cyndia Skeeters Other Clinician: Referring Provider: Cyndia Skeeters Treating Provider/Extender: Skipper Cliche in Treatment: 3 Diagnosis Coding ICD-10 Codes Code Description S01.00XA Unspecified open wound of scalp, initial encounter L98.492 Non-pressure chronic ulcer of skin of other sites with fat layer exposed E11.622 Type 2 diabetes mellitus with other skin ulcer I10 Essential (primary) hypertension Facility Procedures CPT4 Code: 28208138 Description: 11104-Punch biopsy of skin (including simple closure, when performed)  single lesion Modifier: Quantity: 1 CPT4 Code: Description: ICD-10 Diagnosis Description L98.492 Non-pressure chronic ulcer of skin of other sites with fat layer exposed Modifier: Quantity: Physician Procedures CPT4 Code: 11104 Description: Punch biopsy of skin (including simple closure, when performed) single lesion Modifier: Quantity: 1 CPT4 Code: Description: ICD-10 Diagnosis Description L98.492 Non-pressure chronic ulcer of skin of other sites with fat layer exposed Modifier: Quantity: Electronic Signature(s) Signed: 06/21/2020 3:51:31 PM By: Worthy Keeler PA-C Entered By: Worthy Keeler on 06/21/2020 15:51:30
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
## 2020-06-25 LAB — SURGICAL PATHOLOGY

## 2020-06-28 ENCOUNTER — Other Ambulatory Visit: Payer: Self-pay

## 2020-06-28 ENCOUNTER — Encounter: Payer: Medicare Other | Attending: Physician Assistant | Admitting: Physician Assistant

## 2020-06-28 DIAGNOSIS — L98492 Non-pressure chronic ulcer of skin of other sites with fat layer exposed: Secondary | ICD-10-CM | POA: Insufficient documentation

## 2020-06-28 DIAGNOSIS — E1151 Type 2 diabetes mellitus with diabetic peripheral angiopathy without gangrene: Secondary | ICD-10-CM | POA: Diagnosis not present

## 2020-06-28 DIAGNOSIS — E11622 Type 2 diabetes mellitus with other skin ulcer: Secondary | ICD-10-CM | POA: Diagnosis not present

## 2020-06-28 NOTE — Progress Notes (Addendum)
[name], TOWN ([id number]) Visit Report for 06/28/2020 Chief Complaint Document Details Patient Name: [name], [name]. Date of Service: 06/28/2020 1:45 PM Medical Record Number: [id number] Patient Account Number: [id number] Date of Birth/Sex: 1955-07-08 (65 y.o. Male) Treating RN: Dolan Amen Primary Care Provider: Cyndia Skeeters Other Clinician: Referring Provider: Cyndia Skeeters Treating Provider/Extender: Skipper Cliche in Treatment: 4 Information Obtained from: Patient Chief Complaint Scalp traumatic ulcers Electronic Signature(s) Signed: 06/28/2020 1:59:04 PM By: Worthy Keeler PA-C Entered By: Worthy Keeler on 06/28/2020 13:59:04 [name] ([id number]) -------------------------------------------------------------------------------- HPI Details Patient Name: [name] Date of Service: 06/28/2020 1:45 PM Medical Record Number: [id number] Patient Account Number: [id number] Date of Birth/Sex: 1955-06-06 (64 y.o. Male) Treating RN: Dolan Amen Primary Care Provider: Cyndia Skeeters Other Clinician: Referring Provider: Cyndia Skeeters Treating Provider/Extender: Skipper Cliche in Treatment: 4 History of Present Illness HPI Description: 7.2 a1c occurred june 2021 05/28/2020 on evaluation today patient appears for initial inspection here in our clinic concerning issues been having with a wound over the scalp area. This has been present since June 2021 when he was working with his son on a cabinet and a shelf fell on his head. Subsequently he tells me at this point that he has been using peroxide pretty much daily to clean the area and use an antibiotic ointment as well. With that being said he does have a family member that mentions up about the possibility of a skin cancer. I explained that there is a least a chance that there could be a concern at this point. With that being said I am also wondering if it just may be that he needs more appropriate wound care and that  the wounds may heal much more effectively and quickly. Nonetheless I discussed with the patient that we may want to hold off on the biopsy this week but keep it in consideration for next week if we do not see significant improvement over that period of 7 days. The patient is in agreement with that plan. With that being said we will continue to monitor for anything worsening and again I did also recommend for the patient currently that he watch out for any signs of overall worsening. If anything occurs dramatically over the next week for the same he would obviously let me know. The patient does have a history of diabetes with his most recent hemoglobin A1c being 7.2. He also does have a history of hypertension. 06/04/2020 upon evaluation today patient appears to be doing really about the same in regard to the wounds on his scalp. I did discuss with him last visit and I think it still true this time that we probably should go ahead and proceed with the biopsy since there is not a significant improvement. The patient voiced understanding and agreement. He would like to do what is needed to try to get this healed and under control. 1/19; patient was admitted the clinic 2 weeks ago. He has 2 wounds over the occiput of his scalp just to the left of midline. The history here is that he traumatized this in June when something fell off a shelf onto his head while he was working on this. What is disturbing is that the wounds themselves have expanded since then. We used Hydrofera Blue on this last week and a shave biopsy was done of the larger area HOWEVER although we can document that the biopsy that was done last week left lower clinic it was apparently not received by  the pathology department. We are in the process of tracking this down now and we explained this to the patient. 06/21/2020 upon evaluation today patient appears to be doing about the same in regard to his scalp ulcer. Again unfortunately after we  performed the biopsy and sent it to the hospital this was picked up by the volunteered to be delivered to the pathology lab unfortunately this never occurred. We were unable to track down the specimen I am not sure what happened once it left our clinic. Nonetheless the patient is okay with me performing a second biopsy so that we can have it identify if there is any issues specifically with a skin cancer that could be occurring at this point. Obviously that is my biggest concern right now. 06/28/2020 upon evaluation today patient appears to be doing about the same in regard to his scalp ulcer. 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 We have already been in touch with the patient regarding this and subsequently I have coordinated referral to [facility] for surgical intervention as well. The patient is seen with his brother here in the office today. His brother is his transportation and will be taken to [facility] to have this taken care of. With that being said the patient's wound does not appear to be infected at this point overall I feel like he is doing quite well. Electronic Signature(s) Signed: 06/28/2020 5:12:46 PM By: Worthy Keeler PA-C Entered By: Worthy Keeler on 06/28/2020 17:12:45 [name] ([id number]) -------------------------------------------------------------------------------- Physical Exam Details Patient Name: [name] Date of Service: 06/28/2020 1:45 PM Medical Record Number: [id number] Patient Account Number: [id number] Date of Birth/Sex: August 06, 1955 (65 y.o. Male) Treating RN: Dolan Amen Primary Care Provider: Cyndia Skeeters Other Clinician: Referring Provider: Cyndia Skeeters Treating Provider/Extender: Skipper Cliche in Treatment: 4 Constitutional Well-nourished and well-hydrated in no acute distress. Respiratory normal breathing without difficulty. Psychiatric this patient is able  to make decisions and demonstrates good insight into disease process. Alert and Oriented x 3. pleasant and cooperative. Notes Upon inspection patient's wound bed actually showed signs of good granulation at this time. There does not appear to be evidence of active infection. No fevers, chills, nausea, vomiting, or diarrhea. With that being said I do believe that this is still measuring roughly the same. The smaller of the two wounds is showing some signs of improvement but overall I do not see dramatic improvement from where we were previous. Electronic Signature(s) Signed: 06/28/2020 5:13:20 PM By: Worthy Keeler PA-C Entered By: Worthy Keeler on 06/28/2020 17:13:19 [name] ([id number]) -------------------------------------------------------------------------------- Physician Orders Details Patient Name: [name] Date of Service: 06/28/2020 1:45 PM Medical Record Number: [id number] Patient Account Number: [id number] Date of Birth/Sex: 18-Jan-1956 (64 y.o. Male) Treating RN: Dolan Amen Primary Care Provider: Cyndia Skeeters Other Clinician: Referring Provider: Cyndia Skeeters Treating Provider/Extender: Skipper Cliche in Treatment: 4 Verbal / Phone Orders: No Diagnosis Coding ICD-10 Coding Code Description C44.42 Squamous cell carcinoma of skin of scalp and neck S01.00XA Unspecified open wound of scalp, initial encounter L98.492 Non-pressure chronic ulcer of skin of other sites with fat layer exposed E11.622 Type 2 diabetes mellitus with other skin ulcer I10 Essential (primary) hypertension Follow-up Appointments o Return Appointment in 3 weeks. o Nurse Visit as needed Bathing/ Shower/ Hygiene o Clean wound with Normal Saline or wound cleanser. Wound Treatment Wound #1 - Head - Parietal Wound Laterality: Lateral  Cleanser: Byram Ancillary Kit - 15 Day Supply (DME) (Generic) 3 x Per Week/30 Days Discharge Instructions: Use supplies as instructed; Kit contains:  (15) Saline Bullets; (15) 3x3 Gauze; 15 pr Gloves Cleanser: Normal Saline (DME) (Generic) 3 x Per Week/30 Days Discharge Instructions: Wash your hands with soap and water. Remove old dressing, discard into plastic bag and place into trash. Cleanse the wound with Normal Saline prior to applying a clean dressing using gauze sponges, not tissues or cotton balls. Do not scrub or use excessive force. Pat dry using gauze sponges, not tissue or cotton balls. Primary Dressing: Hydrofera Blue Ready Transfer Foam, 2.5x2.5 (in/in) (DME) (Generic) 3 x Per Week/30 Days Discharge Instructions: Apply Hydrofera Blue Ready to wound bed as directed Secured With: Tegaderm Film 4x4 (in/in) 3 x Per Week/30 Days Discharge Instructions: Apply to wound bed Wound #2 - Head - Parietal Wound Laterality: Midline Cleanser: Normal Saline (Generic) 3 x Per Week/30 Days Discharge Instructions: Wash your hands with soap and water. Remove old dressing, discard into plastic bag and place into trash. Cleanse the wound with Normal Saline prior to applying a clean dressing using gauze sponges, not tissues or cotton balls. Do not scrub or use excessive force. Pat dry using gauze sponges, not tissue or cotton balls. Primary Dressing: Hydrofera Blue Ready Transfer Foam, 2.5x2.5 (in/in) (Generic) 3 x Per Week/30 Days Discharge Instructions: Apply Hydrofera Blue Ready to wound bed as directed Secured With: Tegaderm Film 4x4 (in/in) 3 x Per Week/30 Days Discharge Instructions: Apply to wound bed Electronic Signature(s) Signed: 06/28/2020 2:53:14 PM By: Georges Mouse, Minus Breeding RN Signed: 06/28/2020 6:00:33 PM By: Worthy Keeler PA-C Entered By: Georges Mouse, Minus Breeding on 06/28/2020 14:53:14 [name] ([id number]) -------------------------------------------------------------------------------- Problem List Details Patient Name: [name]. Date of Service: 06/28/2020 1:45 PM Medical Record Number: [id number] Patient Account  Number: [id number] Date of Birth/Sex: June 30, 1955 (64 y.o. Male) Treating RN: Dolan Amen Primary Care Provider: Cyndia Skeeters Other Clinician: Referring Provider: Cyndia Skeeters Treating Provider/Extender: Skipper Cliche in Treatment: 4 Active Problems ICD-10 Encounter Code Description Active Date MDM Diagnosis C44.42 Squamous cell carcinoma of skin of scalp and neck 06/28/2020 No Yes S01.00XA Unspecified open wound of scalp, initial encounter 05/28/2020 No Yes L98.492 Non-pressure chronic ulcer of skin of other sites with fat layer exposed 05/28/2020 No Yes E11.622 Type 2 diabetes mellitus with other skin ulcer 05/28/2020 No Yes I10 Essential (primary) hypertension 05/28/2020 No Yes Inactive Problems Resolved Problems Electronic Signature(s) Signed: 06/28/2020 1:58:58 PM By: Worthy Keeler PA-C Previous Signature: 06/28/2020 1:58:19 PM Version By: Worthy Keeler PA-C Entered By: Worthy Keeler on 06/28/2020 13:58:57 Mclaughlin, William E. ([id number]) -------------------------------------------------------------------------------- Progress Note Details Patient Name: [name]. Date of Service: 06/28/2020 1:45 PM Medical Record Number: [id number] Patient Account Number: [id number] Date of Birth/Sex: June 05, 1955 (65 y.o. Male) Treating RN: Dolan Amen Primary Care Provider: Cyndia Skeeters Other Clinician: Referring Provider: Cyndia Skeeters Treating Provider/Extender: Skipper Cliche in Treatment: 4 Subjective Chief Complaint Information obtained from Patient Scalp traumatic ulcers History of Present Illness (HPI) 7.2 a1c occurred june 2021 05/28/2020 on evaluation today patient appears for initial inspection here in our clinic concerning issues been having with a wound over the scalp area. This has been present since June 2021 when he was working with his son on a cabinet and a shelf fell on his head. Subsequently he tells me at this point that he has been using peroxide pretty much  daily to clean the area and use an  antibiotic ointment as well. With that being said he does have a family member that mentions up about the possibility of a skin cancer. I explained that there is a least a chance that there could be a concern at this point. With that being said I am also wondering if it just may be that he needs more appropriate wound care and that the wounds may heal much more effectively and quickly. Nonetheless I discussed with the patient that we may want to hold off on the biopsy this week but keep it in consideration for next week if we do not see significant improvement over that period of 7 days. The patient is in agreement with that plan. With that being said we will continue to monitor for anything worsening and again I did also recommend for the patient currently that he watch out for any signs of overall worsening. If anything occurs dramatically over the next week for the same he would obviously let me know. The patient does have a history of diabetes with his most recent hemoglobin A1c being 7.2. He also does have a history of hypertension. 06/04/2020 upon evaluation today patient appears to be doing really about the same in regard to the wounds on his scalp. I did discuss with him last visit and I think it still true this time that we probably should go ahead and proceed with the biopsy since there is not a significant improvement. The patient voiced understanding and agreement. He would like to do what is needed to try to get this healed and under control. 1/19; patient was admitted the clinic 2 weeks ago. He has 2 wounds over the occiput of his scalp just to the left of midline. The history here is that he traumatized this in June when something fell off a shelf onto his head while he was working on this. What is disturbing is that the wounds themselves have expanded since then. We used Hydrofera Blue on this last week and a shave biopsy was done of the larger  area HOWEVER although we can document that the biopsy that was done last week left lower clinic it was apparently not received by the pathology department. We are in the process of tracking this down now and we explained this to the patient. 06/21/2020 upon evaluation today patient appears to be doing about the same in regard to his scalp ulcer. Again unfortunately after we performed the biopsy and sent it to the hospital this was picked up by the volunteered to be delivered to the pathology lab unfortunately this never occurred. We were unable to track down the specimen I am not sure what happened once it left our clinic. Nonetheless the patient is okay with me performing a second biopsy so that we can have it identify if there is any issues specifically with a skin cancer that could be occurring at this point. Obviously that is my biggest concern right now. 06/28/2020 upon evaluation today patient appears to be doing about the same in regard to his scalp ulcer. 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 We have already been in touch with the patient regarding this and subsequently I have coordinated referral to [facility] for surgical intervention as well. The patient is seen with his brother here in the office today. His brother is his transportation and will be taken to [facility] to have this taken care of. With that being said the  patient's wound does not appear to be infected at this point overall I feel like he is doing quite well. Objective Constitutional Well-nourished and well-hydrated in no acute distress. Vitals Time Taken: 1:44 PM, Weight: 160 lbs, Temperature: 98.3 F, Pulse: 92 bpm, Respiratory Rate: 18 breaths/min, Blood Pressure: 147/84 mmHg. Respiratory normal breathing without difficulty. Psychiatric this patient is able to make decisions and demonstrates good insight into disease process. Alert and Oriented x  3. pleasant and cooperative. [name], [name] ([id number]) General Notes: Upon inspection patient's wound bed actually showed signs of good granulation at this time. There does not appear to be evidence of active infection. No fevers, chills, nausea, vomiting, or diarrhea. With that being said I do believe that this is still measuring roughly the same. The smaller of the two wounds is showing some signs of improvement but overall I do not see dramatic improvement from where we were previous. Integumentary (Hair, Skin) Wound #1 status is Open. Original cause of wound was Trauma. The wound is located on the Lateral Head - Parietal. The wound measures 2.2cm length x 2.2cm width x 0.3cm depth; 3.801cm^2 area and 1.14cm^3 volume. There is Fat Layer (Subcutaneous Tissue) exposed. There is no tunneling or undermining noted. There is a medium amount of serosanguineous drainage noted. There is large (67-100%) red granulation within the wound bed. There is a small (1-33%) amount of necrotic tissue within the wound bed including Adherent Slough. Wound #2 status is Open. Original cause of wound was Trauma. The wound is located on the Midline Head - Parietal. The wound measures 1.4cm length x 1.4cm width x 0.1cm depth; 1.539cm^2 area and 0.154cm^3 volume. There is Fat Layer (Subcutaneous Tissue) exposed. There is no tunneling or undermining noted. There is a medium amount of serosanguineous drainage noted. There is large (67-100%) pink granulation within the wound bed. There is no necrotic tissue within the wound bed. Assessment Active Problems ICD-10 Squamous cell carcinoma of skin of scalp and neck Unspecified open wound of scalp, initial encounter Non-pressure chronic ulcer of skin of other sites with fat layer exposed Type 2 diabetes mellitus with other skin ulcer Essential (primary) hypertension Plan Follow-up Appointments: Return Appointment in 3 weeks. Nurse Visit as needed Bathing/ Shower/  Hygiene: Clean wound with Normal Saline or wound cleanser. WOUND #1: - Head - Parietal Wound Laterality: Lateral Cleanser: Byram Ancillary Kit - 15 Day Supply (DME) (Generic) 3 x Per Week/30 Days Discharge Instructions: Use supplies as instructed; Kit contains: (15) Saline Bullets; (15) 3x3 Gauze; 15 pr Gloves Cleanser: Normal Saline (DME) (Generic) 3 x Per Week/30 Days Discharge Instructions: Wash your hands with soap and water. Remove old dressing, discard into plastic bag and place into trash. Cleanse the wound with Normal Saline prior to applying a clean dressing using gauze sponges, not tissues or cotton balls. Do not scrub or use excessive force. Pat dry using gauze sponges, not tissue or cotton balls. Primary Dressing: Hydrofera Blue Ready Transfer Foam, 2.5x2.5 (in/in) (DME) (Generic) 3 x Per Week/30 Days Discharge Instructions: Apply Hydrofera Blue Ready to wound bed as directed Secured With: Tegaderm Film 4x4 (in/in) 3 x Per Week/30 Days Discharge Instructions: Apply to wound bed WOUND #2: - Head - Parietal Wound Laterality: Midline Cleanser: Normal Saline (Generic) 3 x Per Week/30 Days Discharge Instructions: Wash your hands with soap and water. Remove old dressing, discard into plastic bag and place into trash. Cleanse the wound with Normal Saline prior to applying a clean dressing using gauze sponges, not tissues or  cotton balls. Do not scrub or use excessive force. Pat dry using gauze sponges, not tissue or cotton balls. Primary Dressing: Hydrofera Blue Ready Transfer Foam, 2.5x2.5 (in/in) (Generic) 3 x Per Week/30 Days Discharge Instructions: Apply Hydrofera Blue Ready to wound bed as directed Secured With: Tegaderm Film 4x4 (in/in) 3 x Per Week/30 Days Discharge Instructions: Apply to wound bed #1. Would recommend currently that we continue with the [facility] I do believe this has been beneficial for the patient. #2. I am also can recommend patient continue with the  Tegaderm to cover he will use next care bandage is over-the-counter which I think will do quite well for him as well. We will see patient back for reevaluation in 3 weeks here in the clinic. If anything worsens or changes patient will contact our office for additional recommendations. If he gets into see surgery first in order to get this taken care of that he may not need to come in for this appointment this is just in case so he is not lost to follow-up. [name], [name] ([id number]) Electronic Signature(s) Signed: 06/28/2020 5:13:54 PM By: Worthy Keeler PA-C Entered By: Worthy Keeler on 06/28/2020 17:13:53 [name] ([id number]) -------------------------------------------------------------------------------- SuperBill Details Patient Name: [name] Date of Service: 06/28/2020 Medical Record Number: [id number] Patient Account Number: [id number] Date of Birth/Sex: [birth year] (65 y.o. Male) Treating RN: Dolan Amen Primary Care Provider: Cyndia Skeeters Other Clinician: Referring Provider: Cyndia Skeeters Treating Provider/Extender: Skipper Cliche in Treatment: 4 Diagnosis Coding ICD-10 Codes Code Description C44.42 Squamous cell carcinoma of skin of scalp and neck S01.00XA Unspecified open wound of scalp, initial encounter L98.492 Non-pressure chronic ulcer of skin of other sites with fat layer exposed E11.622 Type 2 diabetes mellitus with other skin ulcer I10 Essential (primary) hypertension Facility Procedures CPT4 Code: [id number] Description: 99213 - WOUND CARE VISIT-LEV 3 EST PT Modifier: Quantity: 1 Physician Procedures CPT4 Code: [id number] Description: 47159 - WC PHYS LEVEL 3 - EST PT Modifier: Quantity: 1 CPT4 Code: Description: ICD-10 Diagnosis Description C44.42 Squamous cell carcinoma of skin of scalp and neck S01.00XA Unspecified open wound of scalp, initial encounter L98.492 Non-pressure chronic ulcer of skin of other sites with fat layer expos E11.622  Type 2  diabetes mellitus with other skin ulcer Modifier: ed Quantity: Electronic Signature(s) Signed: 06/28/2020 5:14:08 PM By: Worthy Keeler PA-C Previous Signature: 06/28/2020 3:14:53 PM Version By: Georges Mouse, Minus Breeding RN Entered By: Worthy Keeler on 06/28/2020 17:14:07
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
## 2020-06-29 DIAGNOSIS — I10 Essential (primary) hypertension: Secondary | ICD-10-CM | POA: Diagnosis not present

## 2020-06-29 DIAGNOSIS — C4442 Squamous cell carcinoma of skin of scalp and neck: Secondary | ICD-10-CM | POA: Diagnosis not present

## 2020-07-05 ENCOUNTER — Encounter: Payer: Medicare Other | Admitting: Physician Assistant

## 2020-07-12 DIAGNOSIS — C4442 Squamous cell carcinoma of skin of scalp and neck: Secondary | ICD-10-CM | POA: Diagnosis not present

## 2020-07-12 DIAGNOSIS — L578 Other skin changes due to chronic exposure to nonionizing radiation: Secondary | ICD-10-CM | POA: Diagnosis not present

## 2020-07-12 DIAGNOSIS — L814 Other melanin hyperpigmentation: Secondary | ICD-10-CM | POA: Diagnosis not present

## 2020-07-12 DIAGNOSIS — L988 Other specified disorders of the skin and subcutaneous tissue: Secondary | ICD-10-CM | POA: Diagnosis not present

## 2020-07-13 ENCOUNTER — Telehealth: Payer: Self-pay

## 2020-07-13 NOTE — Telephone Encounter (Signed)
The pt called requesting an appt to come in the office for a bandage change. He state that he had surgery done at [facility] on yesterday to remove skin cancer off the top of his head. The patient called to request an appt to come in the office today for a bandage change. He also is requesting a order for home Health nurse to come out and help him with wound care.   I contacted [facility] and spoke with a Chrissy and she informed me that she will reach back out to the patient and help him with his needs. I contact the patient back and he informed me that [place] contacted him and is working on getting a Emergency planning/management officer through [employer].

## 2020-07-13 NOTE — Telephone Encounter (Signed)
Thank you. Yes it sounds like [facility] would be able to arrange the wound care / follow-up instead of our office here.  Nobie Putnam, [facility] 07/13/2020, 10:55 AM

## 2020-07-16 ENCOUNTER — Other Ambulatory Visit: Payer: Self-pay

## 2020-07-16 ENCOUNTER — Encounter: Payer: Medicare Other | Admitting: Physician Assistant

## 2020-07-16 DIAGNOSIS — E1151 Type 2 diabetes mellitus with diabetic peripheral angiopathy without gangrene: Secondary | ICD-10-CM | POA: Diagnosis not present

## 2020-07-16 DIAGNOSIS — E11622 Type 2 diabetes mellitus with other skin ulcer: Secondary | ICD-10-CM | POA: Diagnosis not present

## 2020-07-16 DIAGNOSIS — T8189XA Other complications of procedures, not elsewhere classified, initial encounter: Secondary | ICD-10-CM | POA: Diagnosis not present

## 2020-07-16 DIAGNOSIS — L98492 Non-pressure chronic ulcer of skin of other sites with fat layer exposed: Secondary | ICD-10-CM | POA: Diagnosis not present

## 2020-07-16 NOTE — Progress Notes (Addendum)
[name], [name] ([id number]) Visit Report for 07/16/2020 Arrival Information Details Patient Name: [name], DEGRAZIA. Date of Service: 07/16/2020 2:15 PM Medical Record Number: [id number] Patient Account Number: [id number] Date of Birth/Sex: 08-14-1955 (64 y.o. M) Treating RN: Carlene Coria Primary Care Shekia Kuper: Cyndia Skeeters Other Clinician: Referring Dorthey Depace: Cyndia Skeeters Treating Demetrious Rainford/Extender: Skipper Cliche in Treatment: 7 Visit Information History Since Last Visit Added or deleted any medications: No Patient Arrived: Ambulatory Any new allergies or adverse reactions: No Arrival Time: 14:13 Had a fall or experienced change in No Accompanied By: self activities of daily living that may affect Transfer Assistance: None risk of falls: Patient Identification Verified: Yes Signs or symptoms of abuse/neglect since last visito No Secondary Verification Process Completed: Yes Hospitalized since last visit: No Patient Requires Transmission-Based Precautions: No Implantable device outside of the clinic excluding No Patient Has Alerts: No cellular tissue based products placed in the center since last visit: Has Dressing in Place as Prescribed: Yes Pain Present Now: Yes Electronic Signature(s) Signed: 07/16/2020 4:30:05 PM By: Lorine Bears RCP, RRT, CHT Entered By: Lorine Bears on 07/16/2020 14:14:52 [name] ([id number]) -------------------------------------------------------------------------------- Clinic Level of Care Assessment Details Patient Name: [name]. Date of Service: 07/16/2020 2:15 PM Medical Record Number: [id number] Patient Account Number: [id number] Date of Birth/Sex: 16-May-1956 (64 y.o. M) Treating RN: Dolan Amen Primary Care Zarai Orsborn: Cyndia Skeeters Other Clinician: Referring Jyssica Rief: Cyndia Skeeters Treating Levonne Carreras/Extender: Skipper Cliche in Treatment: 7 Clinic Level of Care Assessment Items TOOL 4  Quantity Score X - Use when only an EandM is performed on FOLLOW-UP visit 1 0 ASSESSMENTS - Nursing Assessment / Reassessment X - Reassessment of Co-morbidities (includes updates in patient status) 1 10 X- 1 5 Reassessment of Adherence to Treatment Plan ASSESSMENTS - Wound and Skin Assessment / Reassessment X - Simple Wound Assessment / Reassessment - one wound 1 5 []  - 0 Complex Wound Assessment / Reassessment - multiple wounds []  - 0 Dermatologic / Skin Assessment (not related to wound area) ASSESSMENTS - Focused Assessment []  - Circumferential Edema Measurements - multi extremities 0 []  - 0 Nutritional Assessment / Counseling / Intervention []  - 0 Lower Extremity Assessment (monofilament, tuning fork, pulses) []  - 0 Peripheral Arterial Disease Assessment (using hand held doppler) ASSESSMENTS - Ostomy and/or Continence Assessment and Care []  - Incontinence Assessment and Management 0 []  - 0 Ostomy Care Assessment and Management (repouching, etc.) PROCESS - Coordination of Care X - Simple Patient / Family Education for ongoing care 1 15 []  - 0 Complex (extensive) Patient / Family Education for ongoing care []  - 0 Staff obtains Programmer, systems, Records, Test Results / Process Orders []  - 0 Staff telephones HHA, Nursing Homes / Clarify orders / etc []  - 0 Routine Transfer to another Facility (non-emergent condition) []  - 0 Routine Hospital Admission (non-emergent condition) []  - 0 New Admissions / Biomedical engineer / Ordering NPWT, Apligraf, etc. []  - 0 Emergency Hospital Admission (emergent condition) X- 1 10 Simple Discharge Coordination []  - 0 Complex (extensive) Discharge Coordination PROCESS - Special Needs []  - Pediatric / Minor Patient Management 0 []  - 0 Isolation Patient Management []  - 0 Hearing / Language / Visual special needs []  - 0 Assessment of Community assistance (transportation, D/C planning, etc.) []  - 0 Additional assistance / Altered  mentation []  - 0 Support Surface(s) Assessment (bed, cushion, seat, etc.) INTERVENTIONS - Wound Cleansing / Measurement Deloney, Tia E. ([id number]) X- 1 5 Simple Wound Cleansing - one wound []  -  0 Complex Wound Cleansing - multiple wounds X- 1 5 Wound Imaging (photographs - any number of wounds) []  - 0 Wound Tracing (instead of photographs) X- 1 5 Simple Wound Measurement - one wound []  - 0 Complex Wound Measurement - multiple wounds INTERVENTIONS - Wound Dressings []  - Small Wound Dressing one or multiple wounds 0 X- 1 15 Medium Wound Dressing one or multiple wounds []  - 0 Large Wound Dressing one or multiple wounds []  - 0 Application of Medications - topical []  - 0 Application of Medications - injection INTERVENTIONS - Miscellaneous []  - External ear exam 0 []  - 0 Specimen Collection (cultures, biopsies, blood, body fluids, etc.) []  - 0 Specimen(s) / Culture(s) sent or taken to Lab for analysis []  - 0 Patient Transfer (multiple staff / Civil Service fast streamer / Similar devices) []  - 0 Simple Staple / Suture removal (25 or less) []  - 0 Complex Staple / Suture removal (26 or more) []  - 0 Hypo / Hyperglycemic Management (close monitor of Blood Glucose) []  - 0 Ankle / Brachial Index (ABI) - do not check if billed separately X- 1 5 Vital Signs Has the patient been seen at the hospital within the last three years: Yes Total Score: 80 Level Of Care: New/Established - Level 3 Electronic Signature(s) Signed: 07/16/2020 3:32:14 PM By: Georges Mouse, Minus Breeding RN Entered By: Georges Mouse, Minus Breeding on 07/16/2020 15:22:20 [name] ([id number]) -------------------------------------------------------------------------------- Encounter Discharge Information Details Patient Name: [name] Date of Service: 07/16/2020 2:15 PM Medical Record Number: [id number] Patient Account Number: [id number] Date of Birth/Sex: 1956-01-28 (64 y.o. M) Treating RN: Dolan Amen Primary Care  Chesley Valls: Cyndia Skeeters Other Clinician: Referring [place]: Cyndia Skeeters Treating Billy Rocco/Extender: Skipper Cliche in Treatment: 7 Encounter Discharge Information Items Discharge Condition: Stable Ambulatory Status: Ambulatory Discharge Destination: Home Transportation: Private Auto Accompanied By: self Schedule Follow-up Appointment: Yes Clinical Summary of Care: Electronic Signature(s) Signed: 07/16/2020 3:32:14 PM By: Georges Mouse, Minus Breeding RN Entered By: Georges Mouse, Minus Breeding on 07/16/2020 15:23:09 [name] ([id number]) -------------------------------------------------------------------------------- Lower Extremity Assessment Details Patient Name: [name] Date of Service: 07/16/2020 2:15 PM Medical Record Number: [id number] Patient Account Number: [id number] Date of Birth/Sex: May 11, 1956 (64 y.o. M) Treating RN: Dolan Amen Primary Care Camela Wich: Cyndia Skeeters Other Clinician: Referring Kiptyn Rafuse: Cyndia Skeeters Treating Ruhaan Nordahl/Extender: Skipper Cliche in Treatment: 7 Electronic Signature(s) Signed: 07/16/2020 3:32:14 PM By: Georges Mouse, Minus Breeding RN Entered By: Georges Mouse, Minus Breeding on 07/16/2020 14:42:46 [name] ([id number]) -------------------------------------------------------------------------------- Multi Wound Chart Details Patient Name: [name] Date of Service: 07/16/2020 2:15 PM Medical Record Number: [id number] Patient Account Number: [id number] Date of Birth/Sex: Jan 27, 1956 (64 y.o. M) Treating RN: Dolan Amen Primary Care Xzander Gilham: Cyndia Skeeters Other Clinician: Referring Tiarah Shisler: Cyndia Skeeters Treating Savon Bordonaro/Extender: Skipper Cliche in Treatment: 7 Vital Signs Height(in): Pulse(bpm): 80 Weight(lbs): 160 Blood Pressure(mmHg): 120/72 Body Mass Index(BMI): Temperature(F): 98.1 Respiratory Rate(breaths/min): 18 Photos: [N/A:N/A] Wound Location: Midline Head - Parietal N/A N/A Wounding Event: Surgical  Injury N/A N/A Primary Etiology: Atypical N/A N/A Comorbid History: Hypertension, Type II Diabetes N/A N/A Date Acquired: 07/12/2020 N/A N/A Weeks of Treatment: 0 N/A N/A Wound Status: Open N/A N/A Measurements L x W x D (cm) 6.3x6x0.8 N/A N/A Area (cm) : 29.688 N/A N/A Volume (cm) : 23.75 N/A N/A % Reduction in Area: 0.00% N/A N/A % Reduction in Volume: 0.00% N/A N/A Classification: Full Thickness With Exposed N/A N/A Support Structures Exudate Amount: Medium N/A N/A Exudate Type: Serosanguineous N/A N/A Exudate Color: red, brown  N/A N/A Granulation Amount: None Present (0%) N/A N/A Necrotic Amount: None Present (0%) N/A N/A Exposed Structures: Fat Layer (Subcutaneous Tissue): N/A N/A Yes Bone: Yes Fascia: No Tendon: No Muscle: No Joint: No Epithelialization: None N/A N/A Treatment Notes Electronic Signature(s) Signed: 07/16/2020 3:32:14 PM By: Georges Mouse, Minus Breeding RN Entered By: Georges Mouse, Minus Breeding on 07/16/2020 15:13:24 [name] ([id number]) -------------------------------------------------------------------------------- [place] Details Patient Name: [name] Date of Service: 07/16/2020 2:15 PM Medical Record Number: [id number] Patient Account Number: [id number] Date of Birth/Sex: [birth year] (64 y.o. M) Treating RN: Dolan Amen Primary Care Julion Gatt: Cyndia Skeeters Other Clinician: Referring Rekita Miotke: Cyndia Skeeters Treating Demetrias Goodbar/Extender: Skipper Cliche in Treatment: 7 Active Inactive Pain, Acute or Chronic Nursing Diagnoses: Pain Management - Non-cyclic Acute (Procedural) Goals: Patient will verbalize adequate pain control and receive pain control interventions during procedures as needed Date Initiated: 05/28/2020 Target Resolution Date: 05/29/2020 Goal Status: Active Interventions: Complete pain assessment as per visit requirements Reposition patient for comfort Notes: Soft Tissue Infection Nursing  Diagnoses: Impaired tissue integrity Goals: Patient will remain free of wound infection Date Initiated: 05/28/2020 Target Resolution Date: 06/19/2020 Goal Status: Active Patient/caregiver will verbalize understanding of or measures to prevent infection and contamination in the home setting Date Initiated: 05/28/2020 Target Resolution Date: 06/19/2020 Goal Status: Active Interventions: Assess signs and symptoms of infection every visit Notes: Wound/Skin Impairment Nursing Diagnoses: Impaired tissue integrity Goals: Ulcer/skin breakdown will have a volume reduction of 30% by week 4 Date Initiated: 05/28/2020 Target Resolution Date: 06/25/2020 Goal Status: Active Ulcer/skin breakdown will have a volume reduction of 50% by week 8 Date Initiated: 05/28/2020 Target Resolution Date: 07/23/2020 Goal Status: Active Ulcer/skin breakdown will have a volume reduction of 80% by week 12 Date Initiated: 05/28/2020 Target Resolution Date: 08/20/2020 Goal Status: Active Ulcer/skin breakdown will heal within 14 weeks Date Initiated: 05/28/2020 Target Resolution Date: 08/27/2020 Goal Status: Active Interventions: KHIAN, REMO ([id number]) Assess patient/caregiver ability to obtain necessary supplies Assess ulceration(s) every visit Treatment Activities: Referred to DME Dannon Nguyenthi for dressing supplies : 05/28/2020 Skin care regimen initiated : 05/28/2020 Topical wound management initiated : 05/28/2020 Notes: Electronic Signature(s) Signed: 07/16/2020 3:32:14 PM By: Georges Mouse, Minus Breeding RN Entered By: Georges Mouse, Minus Breeding on 07/16/2020 15:13:17 [name] ([id number]) -------------------------------------------------------------------------------- Pain Assessment Details Patient Name: [name] Date of Service: 07/16/2020 2:15 PM Medical Record Number: [id number] Patient Account Number: [id number] Date of Birth/Sex: [birth year] (64 y.o. M) Treating RN: Dolan Amen Primary Care Demi Trieu:  Cyndia Skeeters Other Clinician: Referring Amey Hossain: Cyndia Skeeters Treating Tayjon Halladay/Extender: Skipper Cliche in Treatment: 7 Active Problems Location of Pain Severity and Description of Pain Patient Has Paino Yes Site Locations Rate the pain. Current Pain Level: 10 Character of Pain Describe the Pain: Stabbing Pain Management and Medication Current Pain Management: Electronic Signature(s) Signed: 07/16/2020 3:32:14 PM By: Georges Mouse, Minus Breeding RN Entered By: Georges Mouse, [place] on 07/16/2020 14:35:07 [name] ([id number]) -------------------------------------------------------------------------------- Patient/Caregiver Education Details Patient Name: [name] Date of Service: 07/16/2020 2:15 PM Medical Record Number: [id number] Patient Account Number: [id number] Date of Birth/Gender: [birth year] (64 y.o. M) Treating RN: Dolan Amen Primary Care Physician: Cyndia Skeeters Other Clinician: Referring Physician: Cyndia Skeeters Treating Physician/Extender: Skipper Cliche in Treatment: 7 Education Assessment Education Provided To: Patient Education Topics Provided Malignant/Atypical Wounds: Methods: Explain/Verbal Responses: State content correctly Electronic Signature(s) Signed: 07/16/2020 3:32:14 PM By: Georges Mouse, Minus Breeding RN Entered By: Georges Mouse, Minus Breeding on 07/16/2020 15:22:40 [name] ([id number]) -------------------------------------------------------------------------------- Wound Assessment Details Patient  Name: [name], BELUE. Date of Service: 07/16/2020 2:15 PM Medical Record Number: [id number] Patient Account Number: [id number] Date of Birth/Sex: 12/29/1955 (64 y.o. M) Treating RN: Cornell Barman Primary Care Betheny Suchecki: Cyndia Skeeters Other Clinician: Referring Kaiyon Hynes: Cyndia Skeeters Treating Valene Villa/Extender: Skipper Cliche in Treatment: 7 Wound Status Wound Number: 1 Primary Etiology: Atypical Wound Location: Lateral Head -  Parietal Wound Status: Converted Wounding Event: Trauma Date Acquired: 11/08/2019 Weeks Of Treatment: 7 Clustered Wound: No Wound Measurements Length: (cm) Width: (cm) Depth: (cm) 2.2 % Reduction in Area: 2.2 % Reduction in Volume: 0.3 Wound Description Classification: Full Thickness Without Exposed Support Structu res Treatment Notes Wound #1 (Head - Parietal) Wound Laterality: Lateral Cleanser Peri-Wound Care Topical Primary Dressing Secondary Dressing Secured With Compression Wrap Compression Stockings Add-Ons Electronic Signature(s) Signed: 07/18/2020 5:08:01 PM By: Gretta Cool, BSN, RN, CWS, Kim RN, BSN Entered By: Gretta Cool, BSN, RN, CWS, Kim on 07/16/2020 18:07:59 [name] ([id number]) -------------------------------------------------------------------------------- Wound Assessment Details Patient Name: [name] Date of Service: 07/16/2020 2:15 PM Medical Record Number: [id number] Patient Account Number: [id number] Date of Birth/Sex: [birth year] (64 y.o. M) Treating RN: Cornell Barman Primary Care Zamantha Strebel: Cyndia Skeeters Other Clinician: Referring Gurman [place]: Cyndia Skeeters Treating Clayton Bosserman/Extender: Skipper Cliche in Treatment: 7 Wound Status Wound Number: 2 Primary Etiology: Atypical Wound Location: Midline Head - Parietal Wound Status: Converted Wounding Event: Trauma Date Acquired: 11/08/2019 Weeks Of Treatment: 7 Clustered Wound: No Wound Measurements Length: (cm) Width: (cm) Depth: (cm) 1.4 % Reduction in Area: 1.4 % Reduction in Volume: 0.1 Wound Description Classification: Full Thickness Without Exposed Support Structu res Treatment Notes Wound #2 (Head - Parietal) Wound Laterality: Midline Cleanser Peri-Wound Care Topical Primary Dressing Secondary Dressing Secured With Compression Wrap Compression Stockings Add-Ons Electronic Signature(s) Signed: 07/18/2020 5:08:01 PM By: Gretta Cool, BSN, RN, CWS, Kim RN, BSN Entered By: Gretta Cool, BSN, RN,  CWS, Kim on 07/16/2020 18:08:16 [name] ([id number]) -------------------------------------------------------------------------------- Wound Assessment Details Patient Name: [name] Date of Service: 07/16/2020 2:15 PM Medical Record Number: [id number] Patient Account Number: [id number] Date of Birth/Sex: 06-25-1955 (64 y.o. M) Treating RN: Dolan Amen Primary Care Jarid Sasso: Cyndia Skeeters Other Clinician: Referring Bibi Economos: Cyndia Skeeters Treating Shali Vesey/Extender: Skipper Cliche in Treatment: 7 Wound Status Wound Number: 3 Primary Etiology: Atypical Wound Location: Midline Head - Parietal Wound Status: Open Wounding Event: Surgical Injury Comorbid History: Hypertension, Type II Diabetes Date Acquired: 07/12/2020 Weeks Of Treatment: 0 Clustered Wound: No Photos Wound Measurements Length: (cm) 6.3 Width: (cm) 6 Depth: (cm) 0.8 Area: (cm) 29.688 Volume: (cm) 23.75 % Reduction in Area: 0% % Reduction in Volume: 0% Epithelialization: None Tunneling: No Undermining: No Wound Description Classification: Full Thickness With Exposed Support Structures Exudate Amount: Medium Exudate Type: Serosanguineous Exudate Color: red, brown Foul Odor After Cleansing: No Slough/Fibrino No Wound Bed Granulation Amount: None Present (0%) Exposed Structure Necrotic Amount: None Present (0%) Fascia Exposed: No Fat Layer (Subcutaneous Tissue) Exposed: Yes Tendon Exposed: No Muscle Exposed: No Joint Exposed: No Bone Exposed: Yes Electronic Signature(s) Signed: 07/16/2020 3:32:14 PM By: Georges Mouse, Minus Breeding RN Entered By: Georges Mouse, Minus Breeding on 07/16/2020 14:42:32 [name] ([id number]) -------------------------------------------------------------------------------- Vitals Details Patient Name: [name] Date of Service: 07/16/2020 2:15 PM Medical Record Number: [id number] Patient Account Number: [id number] Date of Birth/Sex: 1955-07-02 (64 y.o.  M) Treating RN: Carlene Coria Primary Care Quintessa Simmerman: Cyndia Skeeters Other Clinician: Referring Muskan Bolla: Cyndia Skeeters Treating Joeline Freer/Extender: Skipper Cliche in Treatment: 7 Vital Signs Time Taken: 14:14 Temperature (F): 98.1 Weight (lbs): 160 Pulse (  bpm): 80 Respiratory Rate (breaths/min): 18 Blood Pressure (mmHg): 120/72 Reference Range: 80 - 120 mg / dl Electronic Signature(s) Signed: 07/16/2020 4:30:05 PM By: Lorine Bears RCP, RRT, CHT Entered By: Lorine Bears on 07/16/2020 14:18:02

## 2020-07-16 NOTE — Progress Notes (Addendum)
AYANSH, FEUTZ ([id number]) Visit Report for 07/16/2020 Chief Complaint Document Details Patient Name: [name], [name]. Date of Service: 07/16/2020 2:15 PM Medical Record Number: [id number] Patient Account Number: [id number] Date of Birth/Sex: 12-12-1955 (65 y.o. M) Treating RN: Carlene Coria Primary Care Provider: Cyndia Skeeters Other Clinician: Referring Provider: Cyndia Skeeters Treating Provider/Extender: Skipper Cliche in Treatment: 7 Information Obtained from: Patient Chief Complaint Scalp traumatic ulcers Electronic Signature(s) Signed: 07/16/2020 2:33:37 PM By: Worthy Keeler PA-C Entered By: Worthy Keeler on 07/16/2020 14:33:36 [name] ([id number]) -------------------------------------------------------------------------------- HPI Details Patient Name: [name] Date of Service: 07/16/2020 2:15 PM Medical Record Number: [id number] Patient Account Number: [id number] Date of Birth/Sex: Jul 28, 1955 (65 y.o. M) Treating RN: Carlene Coria Primary Care Provider: Cyndia Skeeters Other Clinician: Referring Provider: Cyndia Skeeters Treating Provider/Extender: Skipper Cliche in Treatment: 7 History of Present Illness HPI Description: 7.2 a1c occurred june 2021 05/28/2020 on evaluation today patient appears for initial inspection here in our clinic concerning issues been having with a wound over the scalp area. This has been present since June 2021 when he was working with his son on a cabinet and a shelf fell on his head. Subsequently he tells me at this point that he has been using peroxide pretty much daily to clean the area and use an antibiotic ointment as well. With that being said he does have a family member that mentions up about the possibility of a skin cancer. I explained that there is a least a chance that there could be a concern at this point. With that being said I am also wondering if it just may be that he needs more appropriate wound care and that the  wounds may heal much more effectively and quickly. Nonetheless I discussed with the patient that we may want to hold off on the biopsy this week but keep it in consideration for next week if we do not see significant improvement over that period of 7 days. The patient is in agreement with that plan. With that being said we will continue to monitor for anything worsening and again I did also recommend for the patient currently that he watch out for any signs of overall worsening. If anything occurs dramatically over the next week for the same he would obviously let me know. The patient does have a history of diabetes with his most recent hemoglobin A1c being 7.2. He also does have a history of hypertension. 06/04/2020 upon evaluation today patient appears to be doing really about the same in regard to the wounds on his scalp. I did discuss with him last visit and I think it still true this time that we probably should go ahead and proceed with the biopsy since there is not a significant improvement. The patient voiced understanding and agreement. He would like to do what is needed to try to get this healed and under control. 1/19; patient was admitted the clinic 2 weeks ago. He has 2 wounds over the occiput of his scalp just to the left of midline. The history here is that he traumatized this in June when something fell off a shelf onto his head while he was working on this. What is disturbing is that the wounds themselves have expanded since then. We used Hydrofera Blue on this last week and a shave biopsy was done of the larger area HOWEVER although we can document that the biopsy that was done last week left lower clinic it was apparently not received by  the pathology department. We are in the process of tracking this down now and we explained this to the patient. 06/21/2020 upon evaluation today patient appears to be doing about the same in regard to his scalp ulcer. Again unfortunately after we  performed the biopsy and sent it to the hospital this was picked up by the volunteered to be delivered to the pathology lab unfortunately this never occurred. We were unable to track down the specimen I am not sure what happened once it left our clinic. Nonetheless the patient is okay with me performing a second biopsy so that we can have it identify if there is any issues specifically with a skin cancer that could be occurring at this point. Obviously that is my biggest concern right now. 06/28/2020 upon evaluation today patient appears to be doing about the same in regard to his scalp ulcer. 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 We have already been in touch with the patient regarding this and subsequently I have coordinated referral to [facility] for surgical intervention as well. The patient is seen with his brother here in the office today. His brother is his transportation and will be taken to [facility] to have this taken care of. With that being said the patient's wound does not appear to be infected at this point overall I feel like he is doing quite well. 07/16/2020 upon evaluation today patient unfortunately continues to have issues with significant pain in the scalp region. Unfortunately he tells me that he is having a very difficult time being able to change this dressing on his own. He cannot see the area and tells me that he only the person he has is his brother and unfortunately his brother is not really able to stomach being able to perform the dressing changes due to the severity of the wound. He also has been attempted to be set up with home health unfortunately no one would take him. He is in a very bad spot at this point. He definitely needs some help with caring for this wound. Electronic Signature(s) Signed: 07/16/2020 5:26:14 PM By: Worthy Keeler PA-C Entered By: Worthy Keeler on 07/16/2020  17:26:14 [name] ([id number]) -------------------------------------------------------------------------------- Physical Exam Details Patient Name: [name] Date of Service: 07/16/2020 2:15 PM Medical Record Number: [id number] Patient Account Number: [id number] Date of Birth/Sex: 1956/04/18 (65 y.o. M) Treating RN: Carlene Coria Primary Care Provider: Cyndia Skeeters Other Clinician: Referring Provider: Cyndia Skeeters Treating Provider/Extender: Skipper Cliche in Treatment: 7 Constitutional Well-nourished and well-hydrated in no acute distress. Respiratory normal breathing without difficulty. Psychiatric this patient is able to make decisions and demonstrates good insight into disease process. Alert and Oriented x 3. pleasant and cooperative. Notes Upon inspection patient's wound bed actually showed signs of good granulation at this time. There does not appear to be any evidence of active infection which is great news and overall very pleased with where things stand today. No fevers, chills, nausea, vomiting, or diarrhea. Electronic Signature(s) Signed: 07/16/2020 5:26:28 PM By: Worthy Keeler PA-C Entered By: Worthy Keeler on 07/16/2020 17:26:27 [name] ([id number]) -------------------------------------------------------------------------------- Physician Orders Details Patient Name: [name] Date of Service: 07/16/2020 2:15 PM Medical Record Number: [id number] Patient Account Number: [id number] Date of Birth/Sex: [birth year] (65 y.o. 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 Remove old dressing, discard into plastic bag and place into trash. Cleanse the wound with Normal Saline prior to applying a clean dressing using gauze sponges, not tissues or cotton balls. Do not scrub or use excessive force. Pat dry using gauze sponges, not tissue or cotton balls. Primary Dressing: Xeroform 4x4-HBD (in/in) 3 x Per Day/30 Days Discharge Instructions: Apply Xeroform 4x4-HBD (in/in) as directed Secondary Dressing: Mepilex Border Flex, 4x4 (in/in) 3 x Per Day/30 Days Discharge Instructions: Apply to wound as directed. Do not cut. Secured With: 62M Medipore H Soft Cloth Surgical Tape, 2x2 (in/yd) 3 x Per Day/30 Days Electronic Signature(s) Signed: 07/16/2020 3:32:14 PM By: Georges Mouse, Minus Breeding RN Signed: 07/16/2020 5:46:59 PM By: Worthy Keeler PA-C Entered By: Georges Mouse, Minus Breeding on 07/16/2020 15:21:42 [name] ([id number]) -------------------------------------------------------------------------------- Problem List Details Patient Name: [name]. Date of Service: 07/16/2020 2:15 PM Medical Record Number: [id number] Patient Account Number: [id number] Date of Birth/Sex: 1956/04/21 (65 y.o. M) Treating RN: Carlene Coria Primary Care Provider: Cyndia Skeeters Other Clinician: Referring Provider: Cyndia Skeeters Treating Provider/Extender: Skipper Cliche in Treatment: 7 Active Problems ICD-10 Encounter Code Description Active Date MDM Diagnosis C44.42 Squamous cell carcinoma of skin of scalp and neck 06/28/2020 No Yes S01.00XA Unspecified open wound of scalp, initial  encounter 05/28/2020 No Yes L98.492 Non-pressure chronic ulcer of skin of other sites with fat layer exposed 05/28/2020 No Yes E11.622 Type 2 diabetes mellitus with other skin ulcer 05/28/2020 No Yes I10 Essential (primary) hypertension 05/28/2020 No Yes Inactive Problems Resolved Problems Electronic Signature(s) Signed: 07/16/2020 2:33:32 PM By: Worthy Keeler PA-C Entered By: Worthy Keeler on 07/16/2020 14:33:32 [name] ([id number]) -------------------------------------------------------------------------------- Progress Note Details Patient Name: [name] Date of Service: 07/16/2020 2:15 PM Medical Record Number: [id number] Patient Account Number: [id number] Date of Birth/Sex: [birth year] (65 y.o. M) Treating RN: Carlene Coria Primary Care Provider: Cyndia Skeeters Other Clinician: Referring Provider: Cyndia Skeeters Treating Provider/Extender: Skipper Cliche in Treatment: 7 Subjective Chief Complaint Information obtained from Patient Scalp traumatic ulcers History of Present Illness (HPI) 7.2 a1c occurred june 2021 05/28/2020 on evaluation today patient appears for initial inspection here in our clinic concerning issues been having with a wound over the scalp area. This has been present since June 2021 when he was working with his son on a cabinet and a shelf fell on his head. Subsequently he tells me at this point that he has been using peroxide pretty much daily to clean the area and use an antibiotic ointment as well. With that being said he does have a family member that mentions up about the possibility of a skin cancer. I explained that there is a least a chance that there could be a concern at this point. With that being said I am also wondering if it just may be that he needs more appropriate wound care and that the wounds may heal much more  effectively and quickly. Nonetheless I discussed with the patient that we may want to hold off on the biopsy this week but keep  it in consideration for next week if we do not see significant improvement over that period of 7 days. The patient is in agreement with that plan. With that being said we will continue to monitor for anything worsening and again I did also recommend for the patient currently that he watch out for any signs of overall worsening. If anything occurs dramatically over the next week for the same he would obviously let me know. The patient does have a history of diabetes with his most recent hemoglobin A1c being 7.2. He also does have a history of hypertension. 06/04/2020 upon evaluation today patient appears to be doing really about the same in regard to the wounds on his scalp. I did discuss with him last visit and I think it still true this time that we probably should go ahead and proceed with the biopsy since there is not a significant improvement. The patient voiced understanding and agreement. He would like to do what is needed to try to get this healed and under control. 1/19; patient was admitted the clinic 2 weeks ago. He has 2 wounds over the occiput of his scalp just to the left of midline. The history here is that he traumatized this in June when something fell off a shelf onto his head while he was working on this. What is disturbing is that the wounds themselves have expanded since then. We used Hydrofera Blue on this last week and a shave biopsy was done of the larger area HOWEVER although we can document that the biopsy that was done last week left lower clinic it was apparently not received by the pathology department. We are in the process of tracking this down now and we explained this to the patient. 06/21/2020 upon evaluation today patient appears to be doing about the same in regard to his scalp ulcer. Again unfortunately after we performed the biopsy and sent it to the hospital this was picked up by the volunteered to be delivered to the pathology lab unfortunately this never  occurred. We were unable to track down the specimen I am not sure what happened once it left our clinic. Nonetheless the patient is okay with me performing a second biopsy so that we can have it identify if there is any issues specifically with a skin cancer that could be occurring at this point. Obviously that is my biggest concern right now. 06/28/2020 upon evaluation today patient appears to be doing about the same in regard to his scalp ulcer. 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 We have already been in touch with the patient regarding this and subsequently I have coordinated referral to [facility] for surgical intervention as well. The patient is seen with his brother here in the office today. His brother is his transportation and will be taken to [facility] to have this taken care of. With that being said the patient's wound does not appear to be infected at this point overall I feel like he is doing quite well. 07/16/2020 upon evaluation today patient unfortunately continues to have issues with significant pain in the scalp region. Unfortunately he tells me that he is having a very difficult time being able to change this dressing on his own. He cannot see the area and tells me that he only  the person he has is his brother and unfortunately his brother is not really able to stomach being able to perform the dressing changes due to the severity of the wound. He also has been attempted to be set up with home health unfortunately no one would take him. He is in a very bad spot at this point. He definitely needs some help with caring for this wound. Objective Constitutional Well-nourished and well-hydrated in no acute distress. Vitals Time Taken: 2:14 PM, Weight: 160 lbs, Temperature: 98.1 F, Pulse: 80 bpm, Respiratory Rate: 18 breaths/min, Blood Pressure: 120/72 mmHg. [name], [name]. ([id number]) Respiratory normal  breathing without difficulty. Psychiatric this patient is able to make decisions and demonstrates good insight into disease process. Alert and Oriented x 3. pleasant and cooperative. General Notes: Upon inspection patient's wound bed actually showed signs of good granulation at this time. There does not appear to be any evidence of active infection which is great news and overall very pleased with where things stand today. No fevers, chills, nausea, vomiting, or diarrhea. Integumentary (Hair, Skin) Wound #1 status is Converted. Original cause of wound was Trauma. The date acquired was: 11/08/2019. The wound has been in treatment 7 weeks. The wound is located on the Lateral Head - Parietal. The wound measures 2.2cm length x 2.2cm width x 0.3cm depth. Wound #2 status is Converted. Original cause of wound was Trauma. The date acquired was: 11/08/2019. The wound has been in treatment 7 weeks. The wound is located on the Midline Head - Parietal. The wound measures 1.4cm length x 1.4cm width x 0.1cm depth. Wound #3 status is Open. Original cause of wound was Surgical Injury. The date acquired was: 07/12/2020. The wound is located on the Midline Head - Parietal. The wound measures 6.3cm length x 6cm width x 0.8cm depth; 29.688cm^2 area and 23.75cm^3 volume. There is bone and Fat Layer (Subcutaneous Tissue) exposed. There is no tunneling or undermining noted. There is a medium amount of serosanguineous drainage noted. There is no granulation within the wound bed. There is no necrotic tissue within the wound bed. Assessment Active Problems ICD-10 Squamous cell carcinoma of skin of scalp and neck Unspecified open wound of scalp, initial encounter Non-pressure chronic ulcer of skin of other sites with fat layer exposed Type 2 diabetes mellitus with other skin ulcer Essential (primary) hypertension Plan Follow-up Appointments: Wound #3 Midline Head - Parietal: Nurse Visit as needed - Thursday WOUND #3: -  Head - Parietal Wound Laterality: Midline Cleanser: Normal Saline 3 x Per Day/30 Days Discharge Instructions: Wash your hands with soap and water. Remove old dressing, discard into plastic bag and place into trash. Cleanse the wound with Normal Saline prior to applying a clean dressing using gauze sponges, not tissues or cotton balls. Do not scrub or use excessive force. Pat dry using gauze sponges, not tissue or cotton balls. Primary Dressing: Xeroform 4x4-HBD (in/in) 3 x Per Day/30 Days Discharge Instructions: Apply Xeroform 4x4-HBD (in/in) as directed Secondary Dressing: Mepilex Border Flex, 4x4 (in/in) 3 x Per Day/30 Days Discharge Instructions: Apply to wound as directed. Do not cut. Secured With: 56M Medipore H Soft Cloth Surgical Tape, 2x2 (in/yd) 3 x Per Day/30 Days 1. Would recommend currently that we going continue with the wound care measures as before and the patient is in agreement with the plan. This includes the use of a petroleum Xeroform gauze dressing to try to keep the area moist. 2. I am also can recommend a border foam dressing to cover  and catch any drainage. 3. We will attempt to bring him in for a dressing change on Thursday. He will then subsequently be seen in the surgeon on Monday. And then subsequently we will plan for seeing him here the following Thursday. We will see patient back for reevaluation in 1 week here in the clinic. If anything worsens or changes patient will contact our office for additional recommendations. Electronic Signature(s) Signed: 07/16/2020 6:09:10 PM By: Gretta Cool, BSN, RN, CWS, Kim RN, BSN Signed: 07/17/2020 8:28:22 AM By: Worthy Keeler PA-C Previous Signature: 07/16/2020 5:27:09 PM Version By: Karn Pickler ([id number]) Entered By: Gretta Cool BSN, RN, CWS, Kim on 07/16/2020 18:09:10 [name], [name] ([id number]) -------------------------------------------------------------------------------- SuperBill Details Patient Name:  [name] Date of Service: 07/16/2020 Medical Record Number: [id number] Patient Account Number: [id number] Date of Birth/Sex: 29-Apr-1956 (65 y.o. M) Treating RN: Dolan Amen Primary Care Provider: Cyndia Skeeters Other Clinician: Referring Provider: Cyndia Skeeters Treating Provider/Extender: Skipper Cliche in Treatment: 7 Diagnosis Coding ICD-10 Codes Code Description C44.42 Squamous cell carcinoma of skin of scalp and neck S01.00XA Unspecified open wound of scalp, initial encounter L98.492 Non-pressure chronic ulcer of skin of other sites with fat layer exposed E11.622 Type 2 diabetes mellitus with other skin ulcer I10 Essential (primary) hypertension Facility Procedures CPT4 Code: 44920100 Description: 99213 - WOUND CARE VISIT-LEV 3 EST PT Modifier: Quantity: 1 Physician Procedures CPT4 Code: 7121975 Description: 88325 - WC PHYS LEVEL 3 - EST PT Modifier: Quantity: 1 CPT4 Code: Description: ICD-10 Diagnosis Description S01.00XA Unspecified open wound of scalp, initial encounter C44.42 Squamous cell carcinoma of skin of scalp and neck L98.492 Non-pressure chronic ulcer of skin of other sites with fat layer expos E11.622 Type 2  diabetes mellitus with other skin ulcer Modifier: ed Quantity: Electronic Signature(s) Signed: 07/16/2020 5:27:21 PM By: Worthy Keeler PA-C Previous Signature: 07/16/2020 3:32:14 PM Version By: Georges Mouse, Minus Breeding RN Entered By: Worthy Keeler on 07/16/2020 17:27:20

## 2020-07-17 DIAGNOSIS — C4442 Squamous cell carcinoma of skin of scalp and neck: Secondary | ICD-10-CM | POA: Insufficient documentation

## 2020-07-18 ENCOUNTER — Telehealth: Payer: Self-pay

## 2020-07-18 NOTE — Telephone Encounter (Signed)
Brandy nurse navigator for [employer] called requesting to schedule the patient a face-to-face appt for surgical wound on the patient head. The patient had a dermatology procedure done last week where they removed skin cancer off his head. He suppose to change the bandage frequently, but the patient is not able to change the bandage. Theadora Rama said she reached out to [facility] surgery center where they did the proceed and they never submitted the request. Theadora Rama is requesting that we submit the nursing order to [facility]. Appt scheduled for tomorrow at [place]  [phone] ext [phone]

## 2020-07-18 NOTE — Telephone Encounter (Signed)
I called Dr Hinda Kehr office at [facility] and left a detailed message for them to call me back directly today if possible regarding recommendations for his home health orders specifically wound care etc.  Nobie Putnam, [facility] 07/18/2020, 12:17 PM

## 2020-07-18 NOTE — Telephone Encounter (Signed)
Copied from [place] [phone]. Topic: General - Other >> Jul 18, 2020 10:09 AM Yvette Rack wrote: Reason for CRM: Brandy with [facility] stated pt has a wound on his head that he is unable to change himself so he has been going out to have the bandages changed. Theadora Rama stated pt is eligible for home health services and she would like to request that a Rx is sent to [facility] ph# [phone].  Brandy cb# [phone]

## 2020-07-18 NOTE — Telephone Encounter (Signed)
Home Health Verbal Orders - Caller/Agency: Theadora Rama [place] Number: [phone] Requesting OT/PT/Skilled Nursing/Social Work/Speech Therapy: [facility]  Frequency: [phone]   Fax: [phone]

## 2020-07-19 ENCOUNTER — Ambulatory Visit (INDEPENDENT_AMBULATORY_CARE_PROVIDER_SITE_OTHER): Payer: Medicare Other | Admitting: Family Medicine

## 2020-07-19 ENCOUNTER — Encounter: Payer: Self-pay | Admitting: Family Medicine

## 2020-07-19 ENCOUNTER — Other Ambulatory Visit: Payer: Self-pay

## 2020-07-19 ENCOUNTER — Ambulatory Visit: Payer: Medicare Other

## 2020-07-19 VITALS — BP 134/72 | HR 69 | Temp 97.5°F | Ht 66.0 in | Wt 158.4 lb

## 2020-07-19 DIAGNOSIS — C4442 Squamous cell carcinoma of skin of scalp and neck: Secondary | ICD-10-CM

## 2020-07-19 MED ORDER — XEROFORM OIL EMULSION STRIP EX MISC: CUTANEOUS | 0 refills | Status: DC

## 2020-07-19 NOTE — Telephone Encounter (Signed)
Referred to [facility] home health for RN Wound Care  Could you follow-up with them later today to make sure that they receive the order and can initiate services?  Thank you  Nobie Putnam, DO [facility] 07/19/2020, 9:33 AM

## 2020-07-19 NOTE — Progress Notes (Signed)
Subjective:    Patient ID: [name], male    DOB: [birth year], 65 y.o.   MRN: [id number]  [name] is a 65 y.o. male presenting on 07/19/2020 for Wound Check (Skin cancer on scalp and would like to get referred to [facility].) and Headache (Pt states it has been going on for 7 months and he takes ibuprofen to control the pain.)   HPI   Squamous Cell Skin Cancer of Scalp, T3 SCC Previously diagnosed, had been referred in past by prior PCP/Dermatology to [facility] clinic, seen on 07/12/20 by Dr Manley Mason and had initial lateral partiel scalp partial treatment with Mohs surgery with positive margin in periosteum. Referred to [facility] for further management and procedure. He was advised to use vaseline gauze pad and apply it to wound and replace it once daily and use bandaid.  He has upcoming Wound check by [facility] Surgery   Depression screen [facility] 2/9 08/01/2019 02/10/2019 11/08/2018  Decreased Interest 3 1 1   Down, Depressed, Hopeless 1 0 1  PHQ - 2 Score 4 1 2   Altered sleeping 2 3 0  Tired, decreased energy 3 3 2   Change in appetite 0 0 1  Feeling bad or failure about yourself  0 0 0  Trouble concentrating 3 1 0  Moving slowly or fidgety/restless 0 0 2  Suicidal thoughts 0 0 0  PHQ-9 Score 12 8 7   Difficult doing work/chores Somewhat difficult Not difficult at all Not difficult at all    Social History   Tobacco Use  . Smoking status: Former Smoker    Packs/day: 1.00    Years: 40.00    Pack years: 40.00    Types: Cigarettes    Quit date: 07/25/2018    Years since quitting: 1.9  . Smokeless tobacco: Former Network engineer  . Vaping Use: Never used  Substance Use Topics  . Alcohol use: Not Currently    Comment: quit 2003, drank about 5 beer per week prior  . Drug use: Never    Review of Systems Per HPI unless specifically indicated above     Objective:    BP 134/72 (BP Location: Left Arm, Patient Position: Sitting, Cuff Size: Normal)   Pulse 69    Temp (!) 97.5 F (36.4 C) (Temporal)   Ht 5\' 6"  (1.676 m)   Wt 158 lb 6.4 oz (71.8 kg)   SpO2 98%   BMI 25.57 kg/m   Wt Readings from Last 3 Encounters:  07/19/20 158 lb 6.4 oz (71.8 kg)  05/03/20 161 lb 3.2 oz (73.1 kg)  02/07/20 158 lb 3.2 oz (71.8 kg)    Physical Exam Vitals and nursing note reviewed.  Constitutional:      General: He is not in acute distress.    Appearance: He is well-developed and well-nourished. He is not diaphoretic.     Comments: Well-appearing, comfortable, cooperative  HENT:     Head: Normocephalic and atraumatic.     Mouth/Throat:     Mouth: Oropharynx is clear and moist.  Eyes:     General:        Right eye: No discharge.        Left eye: No discharge.     Conjunctiva/sclera: Conjunctivae normal.  Cardiovascular:     Rate and Rhythm: Normal rate.  Pulmonary:     Effort: Pulmonary effort is normal.  Musculoskeletal:        General: No edema.  Skin:  General: Skin is warm and dry.     Findings: Lesion (top of scalp large surgical wound site, did not re measure full dimensions today, approx  5 cm, has vaseline gauze in place and non adherent pad on top, no oozing or bleeding or cellulitis) present. No erythema or rash.  Neurological:     Mental Status: He is alert and oriented to person, place, and time.  Psychiatric:        Mood and Affect: Mood and affect normal.        Behavior: Behavior normal.     Comments: Well groomed, good eye contact, normal speech and thoughts    Results for orders placed or performed in visit on 06/21/20  Surgical pathology  Result Value Ref Range   SURGICAL PATHOLOGY      Surgical Pathology CASE: [id number] PATIENT: [name] Surgical Pathology Report     Specimen Submitted: A. Lateral head parietal  Clinical History: Trauma to scalp, 6 month non-healing      DIAGNOSIS: A. SKIN, LATERAL HEAD PARIETAL; BIOPSY: - INVASIVE SQUAMOUS CELL CARCINOMA, INCOMPLETELY EXCISED.   GROSS  DESCRIPTION: A. Labeled: Lateral head parietal Received: Formalin Collection time: 3:02 PM on 06/21/2020 Placed into formalin time: 3:05 PM on 06/21/2020 Tissue fragment(s): 1 Size: 0.5 x 0.4 cm excised to a depth of 0.6 cm Description: Received is a partial punch of tan-white skin with underlying soft tissue and hemorrhage.  The specimen is entirely inked green and bisected. Entirely submitted in 1 cassette.  Final Diagnosis performed by Betsy Pries, MD.   Electronically signed 06/25/2020 9:17:19AM The electronic signature indicates that the named Attending Pathologist has evaluated the specimen Technical component performed at [place], [place], [place], [place] [address] Lab: [phone] Dir: Rush Farmer, MD, MMM  Professional component performed at [facility], [facility], [place], [place], [place] [address] Lab: [phone] Dir: Dellia Nims. Rubinas, MD       Assessment & Plan:   Problem List Items Addressed This Visit   None   Visit Diagnoses    SCC (squamous cell carcinoma), scalp/neck    -  Primary   Relevant Medications   Bismuth Tribromoph-Petrolatum (XEROFORM OIL EMULSION STRIP) MISC   Other Relevant Orders   Ambulatory referral to [place] wound care at home Daily dressing changes as described below with vaseline gauze, non adherent pad Refer to [facility] for wound care. Upcoming apt with [facility] for repeat surgery 3/2 and they can proceed with wound care orders after.  Please evaluate Chrishun Scheer for admission to [facility].   Disciplines requested: Nursing, Wound Care / Dressing Changes   Services to provide: [facility]   Physician to follow patient's care (the person listed here will be responsible for signing ongoing orders): PCP   Requested Start of Care Date: Within 2-3 days   I certify that this patient is under my care and that I, or a Nurse Practitioner or Physician's Assistant working with me,  had a face-to-face encounter that meets the physician face-to-face requirements with patient on 07/19/20. The encounter with the patient was in whole, or in part for the following medical condition(s) which is the primary reason for home health care (List medical condition). Squamous cell skin cancer surgical removal on scalp with wound   Special Instructions:  [place] for RN wound care management / dressing changes, will need oil emulsion dressing gauze applied to wound directly once daily and  cover with non-adherent pad and medical tape on edges   Orders Placed This Encounter  Procedures  . Ambulatory referral to Home Health    Referral Priority:   Routine    Referral Type:   Home Health Care    Referral Reason:   Specialty Services Required    Requested Specialty:   [place]    Number of Visits Requested:   1     Meds ordered this encounter  Medications  . Bismuth Tribromoph-Petrolatum (XEROFORM OIL EMULSION STRIP) MISC    Sig: Apply directly to wound, change daily, use non adherent pad on top    Dispense:  50 each    Refill:  0     Follow up plan: Return if symptoms worsen or fail to improve.   Nobie Putnam, [facility] 07/19/2020, 9:13 AM

## 2020-07-19 NOTE — Patient Instructions (Addendum)
Thank you for coming to the office today.  Try to get more Oil Emulsion Pads/Strips  Ordered [facility] RN for wound care dressing change  Replace the oil emulsion strips daily apply non adherent pad on top, use medical tape on edges  Follow up with [facility] as scheduled    Please schedule a Follow-up Appointment to: Return if symptoms worsen or fail to improve.  If you have any other questions or concerns, please feel free to call the office or send a message through [place]. You may also schedule an earlier appointment if necessary.  Additionally, you may be receiving a survey about your experience at our office within a few days to 1 week by e-mail or mail. We value your feedback.  Nobie Putnam, DO [place]

## 2020-07-20 ENCOUNTER — Telehealth: Payer: Self-pay

## 2020-07-20 NOTE — Telephone Encounter (Signed)
Langley Gauss, referral coordinator stated that [facility] denied the referral as well for staffing issues.

## 2020-07-20 NOTE — Telephone Encounter (Signed)
I spoke with Langley Gauss, Referral coordinator concerning the patient referral to [facility]. She informed me that she have reached out to [facility], [facility], and [facility] but all have denied the referral for staffing issues. Langley Gauss is going to try [facility] on today.   I called the patient and told him to contact his Nurse Navigator [place] and have her to reach back out to me. I attempted to contact her, but was unsuccessful with the number she gave me.

## 2020-07-20 NOTE — Telephone Encounter (Signed)
Alright. Thank you.  If it does not work, then patient will need to wait until he sees [facility] again on 3/2 for next procedure. They should be able to arrange home wound care if needed  He is going to a wound care check up on Monday this coming week regardless.  Nobie Putnam, [facility] 07/20/2020, 11:48 AM

## 2020-07-20 NOTE — Telephone Encounter (Signed)
Copied from [place] [phone]. Topic: General - Other >> Jul 19, 2020  5:01 PM Yvette Rack wrote: Reason for CRM: Benjamine Mola with [facility] stated they will not be able to accept referral due to staffing. Cb# [id number]

## 2020-07-23 DIAGNOSIS — Z85828 Personal history of other malignant neoplasm of skin: Secondary | ICD-10-CM | POA: Diagnosis not present

## 2020-07-23 DIAGNOSIS — C4492 Squamous cell carcinoma of skin, unspecified: Secondary | ICD-10-CM | POA: Diagnosis not present

## 2020-07-25 DIAGNOSIS — J449 Chronic obstructive pulmonary disease, unspecified: Secondary | ICD-10-CM | POA: Diagnosis not present

## 2020-07-25 DIAGNOSIS — Z7984 Long term (current) use of oral hypoglycemic drugs: Secondary | ICD-10-CM | POA: Diagnosis not present

## 2020-07-25 DIAGNOSIS — I10 Essential (primary) hypertension: Secondary | ICD-10-CM | POA: Diagnosis not present

## 2020-07-25 DIAGNOSIS — Z87891 Personal history of nicotine dependence: Secondary | ICD-10-CM | POA: Diagnosis not present

## 2020-07-25 DIAGNOSIS — K219 Gastro-esophageal reflux disease without esophagitis: Secondary | ICD-10-CM | POA: Diagnosis not present

## 2020-07-25 DIAGNOSIS — E119 Type 2 diabetes mellitus without complications: Secondary | ICD-10-CM | POA: Diagnosis not present

## 2020-07-25 DIAGNOSIS — Z885 Allergy status to narcotic agent status: Secondary | ICD-10-CM | POA: Diagnosis not present

## 2020-07-25 DIAGNOSIS — Z7982 Long term (current) use of aspirin: Secondary | ICD-10-CM | POA: Diagnosis not present

## 2020-07-25 DIAGNOSIS — Z79899 Other long term (current) drug therapy: Secondary | ICD-10-CM | POA: Diagnosis not present

## 2020-07-25 DIAGNOSIS — C4442 Squamous cell carcinoma of skin of scalp and neck: Secondary | ICD-10-CM | POA: Diagnosis not present

## 2020-07-26 ENCOUNTER — Encounter: Payer: Medicare Other | Attending: Physician Assistant | Admitting: Physician Assistant

## 2020-07-26 ENCOUNTER — Other Ambulatory Visit: Payer: Self-pay

## 2020-07-26 DIAGNOSIS — I10 Essential (primary) hypertension: Secondary | ICD-10-CM | POA: Diagnosis not present

## 2020-07-26 DIAGNOSIS — L98492 Non-pressure chronic ulcer of skin of other sites with fat layer exposed: Secondary | ICD-10-CM | POA: Diagnosis not present

## 2020-07-26 DIAGNOSIS — C4442 Squamous cell carcinoma of skin of scalp and neck: Secondary | ICD-10-CM | POA: Diagnosis not present

## 2020-07-26 DIAGNOSIS — E11622 Type 2 diabetes mellitus with other skin ulcer: Secondary | ICD-10-CM | POA: Insufficient documentation

## 2020-07-26 DIAGNOSIS — T8189XA Other complications of procedures, not elsewhere classified, initial encounter: Secondary | ICD-10-CM | POA: Diagnosis not present

## 2020-07-26 NOTE — Progress Notes (Addendum)
[name], [name] ([id number]) Visit Report for 07/26/2020 Arrival Information Details Patient Name: [name], [name]. Date of Service: 07/26/2020 9:30 AM Medical Record Number: [id number] Patient Account Number: [id number] Date of Birth/Sex: 04-06-1956 (64 y.o. M) Treating RN: Dolan Amen Primary Care Jasha Hodzic: Cyndia Skeeters Other Clinician: Jeanine Luz Referring Jereline Ticer: Cyndia Skeeters Treating Melaya Hoselton/Extender: Skipper Cliche in Treatment: 8 Visit Information History Since Last Visit Added or deleted any medications: No Patient Arrived: [name] Had a fall or experienced change in No Arrival Time: 09:37 activities of daily living that may affect Accompanied By: son risk of falls: Transfer Assistance: None Hospitalized since last visit: No Patient Identification Verified: Yes Pain Present Now: Yes Secondary Verification Process Completed: Yes Patient Requires Transmission-Based Precautions: No Patient Has Alerts: No Electronic Signature(s) Signed: 07/26/2020 1:24:27 PM By: Jeanine Luz Entered By: Jeanine Luz on 07/26/2020 09:40:02 [name] ([id number]) -------------------------------------------------------------------------------- Clinic Level of Care Assessment Details Patient Name: [name] Date of Service: 07/26/2020 9:30 AM Medical Record Number: [id number] Patient Account Number: [id number] Date of Birth/Sex: 10/11/1955 (64 y.o. M) Treating RN: Dolan Amen Primary Care Herlinda Heady: Cyndia Skeeters Other Clinician: Jeanine Luz Referring Brighton Delio: Cyndia Skeeters Treating Zaina Jenkin/Extender: Skipper Cliche in Treatment: 8 Clinic Level of Care Assessment Items TOOL 4 Quantity Score X - Use when only an EandM is performed on FOLLOW-UP visit 1 0 ASSESSMENTS - Nursing Assessment / Reassessment X - Reassessment of Co-morbidities (includes updates in patient status) 1 10 X- 1 5 Reassessment of Adherence to Treatment Plan ASSESSMENTS - Wound and Skin  Assessment / Reassessment X - Simple Wound Assessment / Reassessment - one wound 1 5 X- 1 5 Complex Wound Assessment / Reassessment - multiple wounds X- 1 10 Dermatologic / Skin Assessment (not related to wound area) ASSESSMENTS - Focused Assessment []  - Circumferential Edema Measurements - multi extremities 0 []  - 0 Nutritional Assessment / Counseling / Intervention []  - 0 Lower Extremity Assessment (monofilament, tuning fork, pulses) []  - 0 Peripheral Arterial Disease Assessment (using hand held doppler) ASSESSMENTS - Ostomy and/or Continence Assessment and Care []  - Incontinence Assessment and Management 0 []  - 0 Ostomy Care Assessment and Management (repouching, etc.) PROCESS - Coordination of Care X - Simple Patient / Family Education for ongoing care 1 15 []  - 0 Complex (extensive) Patient / Family Education for ongoing care []  - 0 Staff obtains Consents, Records, Test Results / Process Orders []  - 0 Staff telephones HHA, Nursing Homes / Clarify orders / etc []  - 0 Routine Transfer to another Facility (non-emergent condition) []  - 0 Routine Hospital Admission (non-emergent condition) []  - 0 New Admissions / Biomedical engineer / Ordering NPWT, Apligraf, etc. []  - 0 Emergency Hospital Admission (emergent condition) X- 1 10 Simple Discharge Coordination []  - 0 Complex (extensive) Discharge Coordination PROCESS - Special Needs []  - Pediatric / Minor Patient Management 0 []  - 0 Isolation Patient Management []  - 0 Hearing / Language / Visual special needs []  - 0 Assessment of Community assistance (transportation, D/C planning, etc.) []  - 0 Additional assistance / Altered mentation []  - 0 Support Surface(s) Assessment (bed, cushion, seat, etc.) INTERVENTIONS - Wound Cleansing / Measurement Finger, Tamaj E. ([id number]) X- 1 5 Simple Wound Cleansing - one wound []  - 0 Complex Wound Cleansing - multiple wounds X- 1 5 Wound Imaging (photographs - any number  of wounds) []  - 0 Wound Tracing (instead of photographs) X- 1 5 Simple Wound Measurement - one wound []  - 0 Complex Wound Measurement - multiple  wounds INTERVENTIONS - Wound Dressings []  - Small Wound Dressing one or multiple wounds 0 X- 1 15 Medium Wound Dressing one or multiple wounds []  - 0 Large Wound Dressing one or multiple wounds []  - 0 Application of Medications - topical []  - 0 Application of Medications - injection INTERVENTIONS - Miscellaneous []  - External ear exam 0 []  - 0 Specimen Collection (cultures, biopsies, blood, body fluids, etc.) []  - 0 Specimen(s) / Culture(s) sent or taken to Lab for analysis []  - 0 Patient Transfer (multiple staff / Civil Service fast streamer / Similar devices) []  - 0 Simple Staple / Suture removal (25 or less) []  - 0 Complex Staple / Suture removal (26 or more) []  - 0 Hypo / Hyperglycemic Management (close monitor of Blood Glucose) []  - 0 Ankle / Brachial Index (ABI) - do not check if billed separately X- 1 5 Vital Signs Has the patient been seen at the hospital within the last three years: Yes Total Score: 95 Level Of Care: New/Established - Level 3 Electronic Signature(s) Signed: 07/26/2020 2:54:27 PM By: Georges Mouse, Minus Breeding RN Entered By: Georges Mouse, Minus Breeding on 07/26/2020 10:26:22 [name] ([id number]) -------------------------------------------------------------------------------- Encounter Discharge Information Details Patient Name: [name] Date of Service: 07/26/2020 9:30 AM Medical Record Number: [id number] Patient Account Number: [id number] Date of Birth/Sex: [birth year] (64 y.o. M) Treating RN: Dolan Amen Primary Care Bobi Daudelin: Cyndia Skeeters Other Clinician: Jeanine Luz Referring Ej Pinson: Cyndia Skeeters Treating Curry Dulski/Extender: Skipper Cliche in Treatment: 8 Encounter Discharge Information Items Discharge Condition: Stable Ambulatory Status: Cane Discharge Destination: Home Transportation:  Private Auto Accompanied By: self Schedule Follow-up Appointment: Yes Clinical Summary of Care: Electronic Signature(s) Signed: 07/26/2020 10:27:17 AM By: Georges Mouse, Minus Breeding RN Entered By: Georges Mouse, Minus Breeding on 07/26/2020 10:27:17 [name] ([id number]) -------------------------------------------------------------------------------- Lower Extremity Assessment Details Patient Name: [name] Date of Service: 07/26/2020 9:30 AM Medical Record Number: [id number] Patient Account Number: [id number] Date of Birth/Sex: 1956/05/09 (64 y.o. M) Treating RN: Dolan Amen Primary Care Marlyss Cissell: Cyndia Skeeters Other Clinician: Jeanine Luz Referring Eithel Ryall: Cyndia Skeeters Treating Camya Haydon/Extender: Skipper Cliche in Treatment: 8 Electronic Signature(s) Signed: 07/27/2020 12:54:50 PM By: Jeanine Luz Signed: 07/27/2020 2:14:52 PM By: Georges Mouse, Minus Breeding RN Entered By: Jeanine Luz on 07/27/2020 12:54:50 [name] ([id number]) -------------------------------------------------------------------------------- Multi Wound Chart Details Patient Name: [name]. Date of Service: 07/26/2020 9:30 AM Medical Record Number: [id number] Patient Account Number: [id number] Date of Birth/Sex: 1956-03-13 (64 y.o. M) Treating RN: Dolan Amen Primary Care Tavion Senkbeil: Cyndia Skeeters Other Clinician: Jeanine Luz Referring Chayil Gantt: Cyndia Skeeters Treating Emry Barbato/Extender: Skipper Cliche in Treatment: 8 Vital Signs Height(in): Pulse(bpm): 105 Weight(lbs): 160 Blood Pressure(mmHg): 103/69 Body Mass Index(BMI): Temperature(F): 98.3 Respiratory Rate(breaths/min): 18 Photos: [N/A:N/A] Wound Location: Midline Head - Parietal N/A N/A Wounding Event: Surgical Injury N/A N/A Primary Etiology: Malignant Wound N/A N/A Comorbid History: Hypertension, Type II Diabetes N/A N/A Date Acquired: 07/12/2020 N/A N/A Weeks of Treatment: 1 N/A N/A Wound Status: Open N/A  N/A Classification: Full Thickness With Exposed N/A N/A Support Structures Exudate Amount: Medium N/A N/A Exudate Type: Serosanguineous N/A N/A Exudate Color: red, brown N/A N/A Granulation Amount: None Present (0%) N/A N/A Necrotic Amount: None Present (0%) N/A N/A Exposed Structures: Fat Layer (Subcutaneous Tissue): N/A N/A Yes Bone: Yes Fascia: No Tendon: No Muscle: No Joint: No Epithelialization: None N/A N/A Treatment Notes Electronic Signature(s) Signed: 07/26/2020 10:24:38 AM By: Georges Mouse, Minus Breeding RN Entered By: Georges Mouse, Minus Breeding on 07/26/2020 10:24:38 [name] ([id number]) -------------------------------------------------------------------------------- Multi-Disciplinary  Care Plan Details Patient Name: [name], [name]. Date of Service: 07/26/2020 9:30 AM Medical Record Number: [id number] Patient Account Number: [id number] Date of Birth/Sex: 1956-03-22 (64 y.o. M) Treating RN: Dolan Amen Primary Care Travian Kerner: Cyndia Skeeters Other Clinician: Jeanine Luz Referring Robbi Scurlock: Cyndia Skeeters Treating Danne Scardina/Extender: Skipper Cliche in Treatment: 8 Active Inactive Electronic Signature(s) Signed: 08/03/2020 12:39:46 PM By: Gretta Cool, BSN, RN, CWS, Kim RN, BSN Signed: 08/31/2020 11:57:44 AM By: Charlett Nose RN Previous Signature: 07/26/2020 10:24:31 AM Version By: Georges Mouse, Minus Breeding RN Entered By: Gretta Cool, BSN, RN, CWS, Kim on 08/03/2020 12:39:45 [name] ([id number]) -------------------------------------------------------------------------------- Pain Assessment Details Patient Name: [name], [name]. Date of Service: 07/26/2020 9:30 AM Medical Record Number: [id number] Patient Account Number: [id number] Date of Birth/Sex: 1956/05/15 (64 y.o. M) Treating RN: Dolan Amen Primary Care Hence Derrick: Cyndia Skeeters Other Clinician: Jeanine Luz Referring Esmeralda Malay: Cyndia Skeeters Treating Sadi Arave/Extender: Skipper Cliche in Treatment:  8 Active Problems Location of Pain Severity and Description of Pain Patient Has Paino Yes Site Locations Rate the pain. Current Pain Level: 8 Character of Pain Describe the Pain: Stabbing, Throbbing Pain Management and Medication Current Pain Management: Electronic Signature(s) Signed: 07/26/2020 1:24:27 PM By: Jeanine Luz Signed: 07/26/2020 2:54:27 PM By: Georges Mouse, Minus Breeding RN Entered By: Jeanine Luz on 07/26/2020 09:40:42 [name] ([id number]) -------------------------------------------------------------------------------- Patient/Caregiver Education Details Patient Name: [name]. Date of Service: 07/26/2020 9:30 AM Medical Record Number: [id number] Patient Account Number: [id number] Date of Birth/Gender: [birth year] (64 y.o. M) Treating RN: Dolan Amen Primary Care Physician: Cyndia Skeeters Other Clinician: Jeanine Luz Referring Physician: Cyndia Skeeters Treating Physician/Extender: Skipper Cliche in Treatment: 8 Education Assessment Education Provided To: Patient Education Topics Provided Wound/Skin Impairment: Methods: Explain/Verbal Responses: State content correctly Electronic Signature(s) Signed: 07/26/2020 2:54:27 PM By: Georges Mouse, Minus Breeding RN Entered By: Georges Mouse, Minus Breeding on 07/26/2020 10:26:39 [name] ([id number]) -------------------------------------------------------------------------------- Wound Assessment Details Patient Name: [name] Date of Service: 07/26/2020 9:30 AM Medical Record Number: [id number] Patient Account Number: [id number] Date of Birth/Sex: May 07, 1956 (64 y.o. M) Treating RN: Dolan Amen Primary Care Coda Filler: Cyndia Skeeters Other Clinician: Jeanine Luz Referring Jameika Kinn: Cyndia Skeeters Treating Cleveland Yarbro/Extender: Skipper Cliche in Treatment: 8 Wound Status Wound Number: 3 Primary Etiology: Malignant Wound Wound Location: Midline Head - Parietal Wound Status: Open Wounding  Event: Surgical Injury Comorbid History: Hypertension, Type II Diabetes Date Acquired: 07/12/2020 Weeks Of Treatment: 1 Clustered Wound: No Photos Wound Measurements % Reduction in Area: % Reduction in Volume: Epithelialization: None Wound Description Classification: Full Thickness With Exposed Support Structures Exudate Amount: Medium Exudate Type: Serosanguineous Exudate Color: red, brown Foul Odor After Cleansing: No Slough/Fibrino No Wound Bed Granulation Amount: None Present (0%) Exposed Structure Necrotic Amount: None Present (0%) Fascia Exposed: No Fat Layer (Subcutaneous Tissue) Exposed: Yes Tendon Exposed: No Muscle Exposed: No Joint Exposed: No Bone Exposed: Yes Electronic Signature(s) Signed: 07/26/2020 1:24:27 PM By: Jeanine Luz Signed: 07/26/2020 2:54:27 PM By: Georges Mouse, Minus Breeding RN Entered By: Jeanine Luz on 07/26/2020 09:42:08 [name] ([id number]) -------------------------------------------------------------------------------- Vitals Details Patient Name: [name] Date of Service: 07/26/2020 9:30 AM Medical Record Number: [id number] Patient Account Number: [id number] Date of Birth/Sex: 01-27-1956 (64 y.o. M) Treating RN: Dolan Amen Primary Care Aanvi Voyles: Cyndia Skeeters Other Clinician: Jeanine Luz Referring Paysley Poplar: Cyndia Skeeters Treating Maxima Skelton/Extender: Skipper Cliche in Treatment: 8 Vital Signs Time Taken: 09:39 Temperature (F): 98.3 Weight (lbs): 160 Pulse (bpm): 105 Respiratory Rate (breaths/min): 18 Blood Pressure (mmHg): 103/69 Reference Range: 80 - 120  mg / dl Electronic Signature(s) Signed: 07/26/2020 1:24:27 PM By: Jeanine Luz Entered By: Jeanine Luz on 07/26/2020 09:40:24

## 2020-07-27 NOTE — Progress Notes (Addendum)
SEIBERT, KEETER ([id number]) Visit Report for 07/26/2020 Chief Complaint Document Details Patient Name: [name]. Date of Service: 07/26/2020 9:30 AM Medical Record Number: [id number] Patient Account Number: [id number] Date of Birth/Sex: 06-03-1955 (64 y.o. M) Treating RN: Dolan Amen Primary Care Provider: Cyndia Skeeters Other Clinician: Jeanine Luz Referring Provider: Cyndia Skeeters Treating Provider/Extender: Skipper Cliche in Treatment: 8 Information Obtained from: Patient Chief Complaint Scalp surgical wound Electronic Signature(s) Signed: 07/26/2020 9:58:47 AM By: Worthy Keeler PA-C Entered By: Worthy Mclaughlin on 07/26/2020 09:58:47 [name] ([id number]) -------------------------------------------------------------------------------- HPI Details Patient Name: [name] Date of Service: 07/26/2020 9:30 AM Medical Record Number: [id number] Patient Account Number: [id number] Date of Birth/Sex: 02-09-1956 (64 y.o. M) Treating RN: Dolan Amen Primary Care Provider: Cyndia Skeeters Other Clinician: Jeanine Luz Referring Provider: Cyndia Skeeters Treating Provider/Extender: Skipper Cliche in Treatment: 8 History of Present Illness HPI Description: 7.2 a1c occurred june 2021 05/28/2020 on evaluation today patient appears for initial inspection here in our clinic concerning issues been having with a wound over the scalp area. This has been present since June 2021 when he was working with his son on a cabinet and a shelf fell on his head. Subsequently he tells me at this point that he has been using peroxide pretty much daily to clean the area and use an antibiotic ointment as well. With that being said he does have a family member that mentions up about the possibility of a skin cancer. I explained that there is a least a chance that there could be a concern at this point. With that being said I am also wondering if it just may be that he needs more  appropriate wound care and that the wounds may heal much more effectively and quickly. Nonetheless I discussed with the patient that we may want to hold off on the biopsy this week but keep it in consideration for next week if we do not see significant improvement over that period of 7 days. The patient is in agreement with that plan. With that being said we will continue to monitor for anything worsening and again I did also recommend for the patient currently that he watch out for any signs of overall worsening. If anything occurs dramatically over the next week for the same he would obviously let me know. The patient does have a history of diabetes with his most recent hemoglobin A1c being 7.2. He also does have a history of hypertension. 06/04/2020 upon evaluation today patient appears to be doing really about the same in regard to the wounds on his scalp. I did discuss with him last visit and I think it still true this time that we probably should go ahead and proceed with the biopsy since there is not a significant improvement. The patient voiced understanding and agreement. He would like to do what is needed to try to get this healed and under control. 1/19; patient was admitted the clinic 2 weeks ago. He has 2 wounds over the occiput of his scalp just to the left of midline. The history here is that he traumatized this in June when something fell off a shelf onto his head while he was working on this. What is disturbing is that the wounds themselves have expanded since then. We used Hydrofera Blue on this last week and a shave biopsy was done of the larger area HOWEVER although we can document that the biopsy that was done last week left lower clinic it was  apparently not received by the pathology department. We are in the process of tracking this down now and we explained this to the patient. 06/21/2020 upon evaluation today patient appears to be doing about the same in regard to his scalp  ulcer. Again unfortunately after we performed the biopsy and sent it to the hospital this was picked up by the volunteered to be delivered to the pathology lab unfortunately this never occurred. We were unable to track down the specimen I am not sure what happened once it left our clinic. Nonetheless the patient is okay with me performing a second biopsy so that we can have it identify if there is any issues specifically with a skin cancer that could be occurring at this point. Obviously that is my biggest concern right now. 06/28/2020 upon evaluation today patient appears to be doing about the same in regard to his scalp ulcer. 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 We have already been in touch with the patient regarding this and subsequently I have coordinated referral to [facility] for surgical intervention as well. The patient is seen with his brother here in the office today. His brother is his transportation and will be taken to [facility] to have this taken care of. With that being said the patient's wound does not appear to be infected at this point overall I feel like he is doing quite well. 07/16/2020 upon evaluation today patient unfortunately continues to have issues with significant pain in the scalp region. Unfortunately he tells me that he is having a very difficult time being able to change this dressing on his own. He cannot see the area and tells me that he only the person he has is his brother and unfortunately his brother is not really able to stomach being able to perform the dressing changes due to the severity of the wound. He also has been attempted to be set up with home health unfortunately no one would take him. He is in a very bad spot at this point. He definitely needs some help with caring for this wound. 07/26/2020 upon evaluation today patient appears to be unfortunately having significant issues with  his Scalp from the standpoint of bleeding he apparently had surgery last night to excise and repair where he had a significant squamous cell carcinoma. Nonetheless he tells me that he was not told to put any particular dressing on this but has had a lot of bleeding and drainage on his face really would like someone to help with a dressing change to try to clean him up a little bit. We did attempt to contact them at [facility] to see what would be recommended as far as a dressing just to try to help him out with preventing this from continuing to drain. Nonetheless we were not able to get in touch with anyone before I had to leave clinic and get the patient discharged home today. For that reason we will wait and see what they have to say but I think Xeroform is probably can be a good option for him. Again that this point this has been surgically closed but nonetheless still is quite a bit of significant healing this can have to undergo. Electronic Signature(s) Signed: 07/26/2020 10:14:26 AM By: Worthy Keeler PA-C Entered By: Worthy Mclaughlin on 07/26/2020 10:14:25 [name] ([id number]) -------------------------------------------------------------------------------- Physical Exam Details Patient Name: [name] Date of Service: 07/26/2020 9:30  AM Medical Record Number: [id number] Patient Account Number: [id number] Date of Birth/Sex: 1955/10/25 (64 y.o. M) Treating RN: Dolan Amen Primary Care Provider: Cyndia Skeeters Other Clinician: Jeanine Luz Referring Provider: Cyndia Skeeters Treating Provider/Extender: Skipper Cliche in Treatment: 8 Constitutional Well-nourished and well-hydrated in no acute distress. Respiratory normal breathing without difficulty. Psychiatric this patient is able to make decisions and demonstrates good insight into disease process. Alert and Oriented x 3. pleasant and cooperative. Notes Upon inspection patient's wound is actually completely surgical at  this point. It has been sutured closed there may be a skin graft that was also sutured into place on the lateral/posterior left side of his scalp just behind his ear. With all that being said I do believe that Xeroform would be a good option for dressing in order to help keep things moist and allow it to heal as best as possible. The patient is in agreement with that plan. Electronic Signature(s) Signed: 07/26/2020 10:16:08 AM By: Worthy Keeler PA-C Entered By: Worthy Mclaughlin on 07/26/2020 10:16:07 [name] ([id number]) -------------------------------------------------------------------------------- Physician Orders Details Patient Name: [name] Date of Service: 07/26/2020 9:30 AM Medical Record Number: [id number] Patient Account Number: [id number] Date of Birth/Sex: September 02, 1955 (64 y.o. M) Treating RN: Dolan Amen Primary Care Provider: Cyndia Skeeters Other Clinician: Jeanine Luz Referring Provider: Cyndia Skeeters Treating Provider/Extender: Skipper Cliche in Treatment: 8 Verbal / Phone Orders: No Diagnosis Coding ICD-10 Coding Code Description C44.42 Squamous cell carcinoma of skin of scalp and neck S01.00XA Unspecified open wound of scalp, initial encounter L98.492 Non-pressure chronic ulcer of skin of other sites with fat layer exposed E11.622 Type 2 diabetes mellitus with other skin ulcer I10 Essential (primary) hypertension Follow-up Appointments Wound #3 Midline Head - Parietal o Nurse Visit as needed - Monday Wound Treatment Wound #3 - Head - Parietal Wound Laterality: Midline Cleanser: Normal Saline 3 x Per Day/30 Days Discharge Instructions: Wash your hands with soap and water. Remove old dressing, discard into plastic bag and place into trash. Cleanse the wound with Normal Saline prior to applying a clean dressing using gauze sponges, not tissues or cotton balls. Do not scrub or use excessive force. Pat dry using gauze sponges, not tissue or cotton  balls. Primary Dressing: Xeroform 4x4-HBD (in/in) 3 x Per Day/30 Days Discharge Instructions: Apply Xeroform 4x4-HBD (in/in) as directed Secondary Dressing: ABD Pad 5x9 (in/in) 3 x Per Day/30 Days Discharge Instructions: Cover with ABD pad Secured With: 78M Medipore H Soft Cloth Surgical Tape, 2x2 (in/yd) 3 x Per Day/30 Days Secured With: Protouch 3 x Per Day/30 Days Discharge Instructions: To protect or secure dressing Electronic Signature(s) Signed: 07/26/2020 10:25:39 AM By: Georges Mouse, Minus Breeding RN Signed: 07/27/2020 11:30:15 AM By: Worthy Keeler PA-C Entered By: Georges Mouse, Minus Breeding on 07/26/2020 10:25:38 [name] ([id number]) -------------------------------------------------------------------------------- Problem List Details Patient Name: [name] Date of Service: 07/26/2020 9:30 AM Medical Record Number: [id number] Patient Account Number: [id number] Date of Birth/Sex: September 22, 1955 (64 y.o. M) Treating RN: Dolan Amen Primary Care Provider: Cyndia Skeeters Other Clinician: Jeanine Luz Referring Provider: Cyndia Skeeters Treating Provider/Extender: Skipper Cliche in Treatment: 8 Active Problems ICD-10 Encounter Code Description Active Date MDM Diagnosis C44.42 Squamous cell carcinoma of skin of scalp and neck 06/28/2020 No Yes S01.00XA Unspecified open wound of scalp, initial encounter 05/28/2020 No Yes L98.492 Non-pressure chronic ulcer of skin of other sites with fat layer exposed 05/28/2020 No Yes E11.622 Type 2 diabetes mellitus with other skin ulcer 05/28/2020 No Yes  I10 Essential (primary) hypertension 05/28/2020 No Yes Inactive Problems Resolved Problems Electronic Signature(s) Signed: 07/26/2020 9:52:17 AM By: Worthy Keeler PA-C Entered By: Worthy Mclaughlin on 07/26/2020 09:52:17 [name] ([id number]) -------------------------------------------------------------------------------- Progress Note Details Patient Name: [name]. Date of  Service: 07/26/2020 9:30 AM Medical Record Number: [id number] Patient Account Number: [id number] Date of Birth/Sex: 13-Dec-1955 (64 y.o. M) Treating RN: Dolan Amen Primary Care Provider: Cyndia Skeeters Other Clinician: Jeanine Luz Referring Provider: Cyndia Skeeters Treating Provider/Extender: Skipper Cliche in Treatment: 8 Subjective Chief Complaint Information obtained from Patient Scalp surgical wound History of Present Illness (HPI) 7.2 a1c occurred june 2021 05/28/2020 on evaluation today patient appears for initial inspection here in our clinic concerning issues been having with a wound over the scalp area. This has been present since June 2021 when he was working with his son on a cabinet and a shelf fell on his head. Subsequently he tells me at this point that he has been using peroxide pretty much daily to clean the area and use an antibiotic ointment as well. With that being said he does have a family member that mentions up about the possibility of a skin cancer. I explained that there is a least a chance that there could be a concern at this point. With that being said I am also wondering if it just may be that he needs more appropriate wound care and that the wounds may heal much more effectively and quickly. Nonetheless I discussed with the patient that we may want to hold off on the biopsy this week but keep it in consideration for next week if we do not see significant improvement over that period of 7 days. The patient is in agreement with that plan. With that being said we will continue to monitor for anything worsening and again I did also recommend for the patient currently that he watch out for any signs of overall worsening. If anything occurs dramatically over the next week for the same he would obviously let me know. The patient does have a history of diabetes with his most recent hemoglobin A1c being 7.2. He also does have a history of hypertension. 06/04/2020 upon  evaluation today patient appears to be doing really about the same in regard to the wounds on his scalp. I did discuss with him last visit and I think it still true this time that we probably should go ahead and proceed with the biopsy since there is not a significant improvement. The patient voiced understanding and agreement. He would like to do what is needed to try to get this healed and under control. 1/19; patient was admitted the clinic 2 weeks ago. He has 2 wounds over the occiput of his scalp just to the left of midline. The history here is that he traumatized this in June when something fell off a shelf onto his head while he was working on this. What is disturbing is that the wounds themselves have expanded since then. We used Hydrofera Blue on this last week and a shave biopsy was done of the larger area HOWEVER although we can document that the biopsy that was done last week left lower clinic it was apparently not received by the pathology department. We are in the process of tracking this down now and we explained this to the patient. 06/21/2020 upon evaluation today patient appears to be doing about the same in regard to his scalp ulcer. Again unfortunately after we performed the biopsy and  sent it to the hospital this was picked up by the volunteered to be delivered to the pathology lab unfortunately this never occurred. We were unable to track down the specimen I am not sure what happened once it left our clinic. Nonetheless the patient is okay with me performing a second biopsy so that we can have it identify if there is any issues specifically with a skin cancer that could be occurring at this point. Obviously that is my biggest concern right now. 06/28/2020 upon evaluation today patient appears to be doing about the same in regard to his scalp ulcer. 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 We have  already been in touch with the patient regarding this and subsequently I have coordinated referral to [facility] for surgical intervention as well. The patient is seen with his brother here in the office today. His brother is his transportation and will be taken to [facility] to have this taken care of. With that being said the patient's wound does not appear to be infected at this point overall I feel like he is doing quite well. 07/16/2020 upon evaluation today patient unfortunately continues to have issues with significant pain in the scalp region. Unfortunately he tells me that he is having a very difficult time being able to change this dressing on his own. He cannot see the area and tells me that he only the person he has is his brother and unfortunately his brother is not really able to stomach being able to perform the dressing changes due to the severity of the wound. He also has been attempted to be set up with home health unfortunately no one would take him. He is in a very bad spot at this point. He definitely needs some help with caring for this wound. 07/26/2020 upon evaluation today patient appears to be unfortunately having significant issues with his Scalp from the standpoint of bleeding he apparently had surgery last night to excise and repair where he had a significant squamous cell carcinoma. Nonetheless he tells me that he was not told to put any particular dressing on this but has had a lot of bleeding and drainage on his face really would like someone to help with a dressing change to try to clean him up a little bit. We did attempt to contact them at [facility] to see what would be recommended as far as a dressing just to try to help him out with preventing this from continuing to drain. Nonetheless we were not able to get in touch with anyone before I had to leave clinic and get the patient discharged home today. For that reason we will wait and see what they have to say but I think Xeroform  is probably can be a good option for him. Again that this point this has been surgically closed but nonetheless still is quite a bit of significant healing this can have to undergo. [name], [name] ([id number]) Objective Constitutional Well-nourished and well-hydrated in no acute distress. Vitals Time Taken: 9:39 AM, Weight: 160 lbs, Temperature: 98.3 F, Pulse: 105 bpm, Respiratory Rate: 18 breaths/min, Blood Pressure: 103/69 mmHg. Respiratory normal breathing without difficulty. Psychiatric this patient is able to make decisions and demonstrates good insight into disease process. Alert and Oriented x 3. pleasant and cooperative. General Notes: Upon inspection patient's wound is actually completely surgical at this point. It has been sutured closed there may be a skin graft  that was also sutured into place on the lateral/posterior left side of his scalp just behind his ear. With all that being said I do believe that Xeroform would be a good option for dressing in order to help keep things moist and allow it to heal as best as possible. The patient is in agreement with that plan. Integumentary (Hair, Skin) Wound #3 status is Open. Original cause of wound was Surgical Injury. The date acquired was: 07/12/2020. The wound has been in treatment 1 weeks. The wound is located on the Midline Head - Parietal. There is bone and Fat Layer (Subcutaneous Tissue) exposed. There is a medium amount of serosanguineous drainage noted. There is no granulation within the wound bed. There is no necrotic tissue within the wound bed. Assessment Active Problems ICD-10 Squamous cell carcinoma of skin of scalp and neck Unspecified open wound of scalp, initial encounter Non-pressure chronic ulcer of skin of other sites with fat layer exposed Type 2 diabetes mellitus with other skin ulcer Essential (primary) hypertension Plan Follow-up Appointments: Wound #3 Midline Head - Parietal: Nurse Visit as needed -  Monday WOUND #3: - Head - Parietal Wound Laterality: Midline Cleanser: Normal Saline 3 x Per Day/30 Days Discharge Instructions: Wash your hands with soap and water. Remove old dressing, discard into plastic bag and place into trash. Cleanse the wound with Normal Saline prior to applying a clean dressing using gauze sponges, not tissues or cotton balls. Do not scrub or use excessive force. Pat dry using gauze sponges, not tissue or cotton balls. Primary Dressing: Xeroform 4x4-HBD (in/in) 3 x Per Day/30 Days Discharge Instructions: Apply Xeroform 4x4-HBD (in/in) as directed Secondary Dressing: ABD Pad 5x9 (in/in) 3 x Per Day/30 Days Discharge Instructions: Cover with ABD pad Secured With: 30M Medipore H Soft Cloth Surgical Tape, 2x2 (in/yd) 3 x Per Day/30 Days Secured With: Protouch 3 x Per Day/30 Days Discharge Instructions: To protect or secure dressing 1. Would recommend at this time that we have the patient go ahead and use the Xeroform gauze will do the dressing changes here as this is not something is good to be able to do at home. 2. We will cover this with ABD pads and then a stretchy netting in order to secure everything in place. 3. I am also can recommend that the patient monitor for any signs of worsening pain if any of that occurs he really should contact his surgeon to see what they recommend another I did give him hydrocodone to help with pain but nonetheless this was a very significant surgery. We will see patient back for reevaluation in 1 week here in the clinic. If anything worsens or changes patient will contact our office for additional recommendations. [name], [name] ([id number]) Addendum: I actually did have a conversation with the nurse at [facility] this was [place] who apparently is a triage nurse there. Subsequently it does sound like the patient was actually advised to stay in the hospital overnight but refused and therefore was discharged home. He was discharged with  a dressing in place I have no idea what happened to the dressing in the interim from when he was discharged and when he was seen in my office the next morning which was already a preset appointment. That was obviously this morning. Nonetheless they did want [place] to advise the patient and again the same thing they told him that basically they wanted him using bacitracin ointment over the staple lines and then subsequently put an ABD pad over  and doing something to secure this. The problem is the patient does not have any way to do this on his own accord he has no one to help him at home. They have also not been able to get home health apparently that is been attempted as well. Nonetheless in the end I did discuss with Alleen Borne that probably the best thing is for [place] to continue with the Xeroform as we have been doing as this seems to probably be his best bet at getting the dressing change we will plan to still see him on Monday and again what were doing basically is not significantly different from using the bacitracin ointment and ABD pads we just use Xeroform and ABD pads. We will see the patient therefore for the dressing change on Monday. He sees them for follow-up on Tuesday I do believe. Electronic Signature(s) Signed: 07/27/2020 8:12:26 AM By: Worthy Keeler PA-C Previous Signature: 07/26/2020 10:24:46 AM Version By: Worthy Keeler PA-C Entered By: Worthy Mclaughlin on 07/27/2020 08:12:25 [name] ([id number]) -------------------------------------------------------------------------------- SuperBill Details Patient Name: [name] Date of Service: 07/26/2020 Medical Record Number: [id number] Patient Account Number: [id number] Date of Birth/Sex: [birth year] (64 y.o. M) Treating RN: Dolan Amen Primary Care Provider: Cyndia Skeeters Other Clinician: Jeanine Luz Referring Provider: Cyndia Skeeters Treating Provider/Extender: Skipper Cliche in Treatment: 8 Diagnosis Coding ICD-10  Codes Code Description C44.42 Squamous cell carcinoma of skin of scalp and neck S01.00XA Unspecified open wound of scalp, initial encounter L98.492 Non-pressure chronic ulcer of skin of other sites with fat layer exposed E11.622 Type 2 diabetes mellitus with other skin ulcer I10 Essential (primary) hypertension Facility Procedures CPT4 Code: [id number] Description: 99213 - WOUND CARE VISIT-LEV 3 EST PT Modifier: Quantity: 1 Physician Procedures CPT4 Code: [id number] Description: 46962 - WC PHYS LEVEL 3 - EST PT Modifier: Quantity: 1 CPT4 Code: Description: ICD-10 Diagnosis Description C44.42 Squamous cell carcinoma of skin of scalp and neck S01.00XA Unspecified open wound of scalp, initial encounter L98.492 Non-pressure 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

## 2020-07-30 ENCOUNTER — Telehealth: Payer: Self-pay | Admitting: Family Medicine

## 2020-07-30 ENCOUNTER — Other Ambulatory Visit: Payer: Self-pay

## 2020-07-30 ENCOUNTER — Ambulatory Visit: Payer: Medicare Other

## 2020-07-30 DIAGNOSIS — Z87891 Personal history of nicotine dependence: Secondary | ICD-10-CM | POA: Diagnosis not present

## 2020-07-30 DIAGNOSIS — E785 Hyperlipidemia, unspecified: Secondary | ICD-10-CM | POA: Diagnosis not present

## 2020-07-30 DIAGNOSIS — E1136 Type 2 diabetes mellitus with diabetic cataract: Secondary | ICD-10-CM | POA: Diagnosis not present

## 2020-07-30 DIAGNOSIS — Z483 Aftercare following surgery for neoplasm: Secondary | ICD-10-CM | POA: Diagnosis not present

## 2020-07-30 DIAGNOSIS — E1169 Type 2 diabetes mellitus with other specified complication: Secondary | ICD-10-CM

## 2020-07-30 DIAGNOSIS — Z7984 Long term (current) use of oral hypoglycemic drugs: Secondary | ICD-10-CM | POA: Diagnosis not present

## 2020-07-30 DIAGNOSIS — I10 Essential (primary) hypertension: Secondary | ICD-10-CM | POA: Diagnosis not present

## 2020-07-30 DIAGNOSIS — Z79899 Other long term (current) drug therapy: Secondary | ICD-10-CM | POA: Diagnosis not present

## 2020-07-30 DIAGNOSIS — Z9181 History of falling: Secondary | ICD-10-CM | POA: Diagnosis not present

## 2020-07-30 DIAGNOSIS — C4442 Squamous cell carcinoma of skin of scalp and neck: Secondary | ICD-10-CM | POA: Diagnosis not present

## 2020-07-30 DIAGNOSIS — J432 Centrilobular emphysema: Secondary | ICD-10-CM | POA: Diagnosis not present

## 2020-07-30 DIAGNOSIS — Z4801 Encounter for change or removal of surgical wound dressing: Secondary | ICD-10-CM | POA: Diagnosis not present

## 2020-07-30 DIAGNOSIS — G4730 Sleep apnea, unspecified: Secondary | ICD-10-CM | POA: Diagnosis not present

## 2020-07-30 DIAGNOSIS — K219 Gastro-esophageal reflux disease without esophagitis: Secondary | ICD-10-CM | POA: Diagnosis not present

## 2020-07-30 MED ORDER — HYDROCHLOROTHIAZIDE 12.5 MG PO CAPS: ORAL_CAPSULE | ORAL | 1 refills | Status: DC

## 2020-07-30 MED ORDER — METFORMIN HCL 1000 MG PO TABS: ORAL_TABLET | ORAL | 1 refills | Status: DC

## 2020-07-30 NOTE — Progress Notes (Signed)
Refill of Hydrochlorothiazide sent to [facility] in [place]

## 2020-07-30 NOTE — Progress Notes (Signed)
Refill on Metformin sent to [facility] in [place]

## 2020-07-30 NOTE — Telephone Encounter (Signed)
[facility] is DME medical supply.  Typically we would need to do an in office face to face evaluation for medical necessity.  He will need to schedule a follow-up for this particular problem - we could possibly do virtual visit if preferred if he cannot come into office.  We will need to know the following:  1. Where to send the rx / order - which company is he going to use. If he has no preference, then we can choose a local medical supply company and fax to them.  2. Need to know the diagnosis or reason that he has difficulty walking without the walker. In order to get insurance to approve it, he needs to have a qualifying diagnosis.  Nobie Putnam, [facility] 07/30/2020, 5:43 PM

## 2020-07-30 NOTE — Telephone Encounter (Signed)
Called to request an order for a 4 wheel walker for patient.  Please call to discuss at [phone]

## 2020-07-31 ENCOUNTER — Other Ambulatory Visit: Payer: Self-pay

## 2020-07-31 DIAGNOSIS — C4442 Squamous cell carcinoma of skin of scalp and neck: Secondary | ICD-10-CM | POA: Diagnosis not present

## 2020-07-31 DIAGNOSIS — I10 Essential (primary) hypertension: Secondary | ICD-10-CM

## 2020-07-31 DIAGNOSIS — E785 Hyperlipidemia, unspecified: Secondary | ICD-10-CM

## 2020-07-31 DIAGNOSIS — K219 Gastro-esophageal reflux disease without esophagitis: Secondary | ICD-10-CM

## 2020-07-31 DIAGNOSIS — E1169 Type 2 diabetes mellitus with other specified complication: Secondary | ICD-10-CM

## 2020-07-31 DIAGNOSIS — Z09 Encounter for follow-up examination after completed treatment for conditions other than malignant neoplasm: Secondary | ICD-10-CM | POA: Diagnosis not present

## 2020-07-31 MED ORDER — LISINOPRIL 20 MG PO TABS: 20.0000 mg | ORAL_TABLET | Freq: Every day | ORAL | 1 refills | Status: DC

## 2020-07-31 MED ORDER — OMEPRAZOLE 20 MG PO CPDR: 20.0000 mg | DELAYED_RELEASE_CAPSULE | Freq: Every day | ORAL | 1 refills | Status: DC

## 2020-07-31 MED ORDER — ATORVASTATIN CALCIUM 40 MG PO TABS: ORAL_TABLET | ORAL | 1 refills | Status: DC

## 2020-07-31 NOTE — Telephone Encounter (Signed)
Virtual appt scheduled for tomorrow for walker DME.

## 2020-07-31 NOTE — Telephone Encounter (Signed)
Attempted to contact the patient, no answer. Left a detail message on the patient vm to return my call.

## 2020-08-01 ENCOUNTER — Telehealth (INDEPENDENT_AMBULATORY_CARE_PROVIDER_SITE_OTHER): Payer: Medicare Other | Admitting: Family Medicine

## 2020-08-01 ENCOUNTER — Encounter: Payer: Self-pay | Admitting: Family Medicine

## 2020-08-01 ENCOUNTER — Other Ambulatory Visit: Payer: Self-pay

## 2020-08-01 DIAGNOSIS — Z9181 History of falling: Secondary | ICD-10-CM

## 2020-08-01 DIAGNOSIS — H547 Unspecified visual loss: Secondary | ICD-10-CM | POA: Diagnosis not present

## 2020-08-01 DIAGNOSIS — R2689 Other abnormalities of gait and mobility: Secondary | ICD-10-CM

## 2020-08-01 DIAGNOSIS — M1990 Unspecified osteoarthritis, unspecified site: Secondary | ICD-10-CM | POA: Diagnosis not present

## 2020-08-01 NOTE — Patient Instructions (Addendum)
[facility] [address] [place], [place] [address] Ph: [phone] Fax: [phone]  Please schedule a Follow-up Appointment to: Return if symptoms worsen or fail to improve.  If you have any other questions or concerns, please feel free to call the office or send a message through [facility]. You may also schedule an earlier appointment if necessary.  Additionally, you may be receiving a survey about your experience at our office within a few days to 1 week by e-mail or mail. We value your feedback.  Nobie Putnam, DO [place]

## 2020-08-01 NOTE — Progress Notes (Signed)
Virtual Visit via Telephone The purpose of this virtual visit is to provide medical care while limiting exposure to the novel coronavirus (COVID19) for both patient and office staff.  Consent was obtained for phone visit:  Yes.   Answered questions that patient had about telehealth interaction:  Yes.   I discussed the limitations, risks, security and privacy concerns of performing an evaluation and management service by telephone. I also discussed with the patient that there may be a patient responsible charge related to this service. The patient expressed understanding and agreed to proceed.  Patient Location: Home Provider Location: [facility] (Office)  Participants in virtual visit: - Patient: [name]. Toma Copier - CMA: Orinda Kenner, CMA - Provider: Dr Parks Ranger  ---------------------------------------------------------------------- Chief Complaint  Patient presents with  . Post-op Problem    Pt complains of balance issues, difficulty with vision post surgery x 1 week ago. 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 The pt requesting a order for a walker for immobility post surgery. He would like rollator walker so he can seat down when he gets tired.     S: Reviewed CMA documentation. I have called patient and gathered additional HPI as follows:  Balance Disorder / Dizziness / Poor Vision Reports that symptoms started worsening in past 1 week after surgery and he has had gradual decline with his balance, he has poor vision and imbalance. He feels at risk of fall. He has tried using cane but cannot keep his balance, he will need a walker to maintain balance. - He is interested in a rollator walker  Denies any numbness weakness tingling, fall, dyspnea chest pain, swelling  Past Medical History:  Diagnosis Date  . Anxiety   . COPD (chronic obstructive pulmonary disease) ([place])   . Diabetes mellitus without complication ([place])   . GERD (gastroesophageal  reflux disease)   . Hypertension   . Insomnia   . Personal history of tobacco use, presenting hazards to health 05/31/2015   Social History   Tobacco Use  . Smoking status: Former Smoker    Packs/day: 1.00    Years: 40.00    Pack years: 40.00    Types: Cigarettes    Quit date: 07/25/2018    Years since quitting: 2.0  . Smokeless tobacco: Former Network engineer  . Vaping Use: Never used  Substance Use Topics  . Alcohol use: Not Currently    Comment: quit 2003, drank about 5 beer per week prior  . Drug use: Never    Current Outpatient Medications:  .  albuterol (PROVENTIL) (2.5 MG/3ML) 0.083% nebulizer solution, Take 3 mLs (2.5 mg total) by nebulization every 6 (six) hours as needed for wheezing or shortness of breath., Disp: 75 mL, Rfl: 1 .  atorvastatin (LIPITOR) 40 MG tablet, TAKE 1 TABLET BY MOUTH AT BEDTIME FOR HIGH CHOLESTEROL, Disp: 90 tablet, Rfl: 1 .  hydrochlorothiazide (MICROZIDE) 12.5 MG capsule, TAKE ONE CAPSULE BY MOUTH ONCE DAILY FOR HIGH BLOOD PRESSURE, Disp: 90 capsule, Rfl: 1 .  lisinopril (ZESTRIL) 20 MG tablet, Take 1 tablet (20 mg total) by mouth daily. for high blood pressure, Disp: 90 tablet, Rfl: 1 .  Melatonin 10 MG CAPS, Take by mouth., Disp: , Rfl:  .  metFORMIN (GLUCOPHAGE) 1000 MG tablet, Take 1 tablet twice daily with food for Diabetes, Disp: 180 tablet, Rfl: 1 .  omeprazole (PRILOSEC) 20 MG capsule, Take 1 capsule (20 mg total) by mouth daily., Disp: 90 capsule, Rfl: 1 .  oxyCODONE (OXY IR/ROXICODONE) 5 MG immediate release tablet, Take 5 mg by mouth every 6 (six) hours as needed., Disp: , Rfl:  .  traZODone (DESYREL) 100 MG tablet, Take 0.5-1 tablets (50-100 mg total) by mouth at bedtime as needed for sleep., Disp: 90 tablet, Rfl: 1 .  Bismuth Tribromoph-Petrolatum (XEROFORM OIL EMULSION STRIP) MISC, Apply directly to wound, change daily, use non adherent pad on top (Patient not taking: Reported on 08/01/2020), Disp: 50 each, Rfl: 0 .  busPIRone (BUSPAR) 5  MG tablet, Take 1 tablet (5 mg total) by mouth 3 (three) times daily. (Patient not taking: Reported on 08/01/2020), Disp: 90 tablet, Rfl: 1 .  HYDROcodone-acetaminophen (NORCO/VICODIN) 5-325 MG tablet, Take by mouth. (Patient not taking: Reported on 08/01/2020), Disp: , Rfl:  .  Multiple Vitamins-Minerals (VITAMIN D3 COMPLETE PO), Take by mouth.  (Patient not taking: Reported on 08/01/2020), Disp: , Rfl:   Depression screen [facility] 2/9 08/01/2019 02/10/2019 11/08/2018  Decreased Interest 3 1 1   Down, Depressed, Hopeless 1 0 1  PHQ - 2 Score 4 1 2   Altered sleeping 2 3 0  Tired, decreased energy 3 3 2   Change in appetite 0 0 1  Feeling bad or failure about yourself  0 0 0  Trouble concentrating 3 1 0  Moving slowly or fidgety/restless 0 0 2  Suicidal thoughts 0 0 0  PHQ-9 Score 12 8 7   Difficult doing work/chores Somewhat difficult Not difficult at all Not difficult at all    GAD 7 : Generalized Anxiety Score 07/27/2018  Nervous, Anxious, on Edge 0  Control/stop worrying 0  Worry too much - different things 0  Trouble relaxing 0  Restless 0  Easily annoyed or irritable 0  Afraid - awful might happen 0  Total GAD 7 Score 0  Anxiety Difficulty Not difficult at all    -------------------------------------------------------------------------- O: No physical exam performed due to remote telephone encounter.  Lab results reviewed.  Recent Results (from the past 2160 hour(s))  Surgical pathology     Status: None   Collection Time: 06/21/20  3:02 PM  Result Value Ref Range   SURGICAL PATHOLOGY      Surgical Pathology CASE: [id number] PATIENT: [name] Surgical Pathology Report     Specimen Submitted: A. Lateral head parietal  Clinical History: Trauma to scalp, 6 month non-healing      DIAGNOSIS: A. SKIN, LATERAL HEAD PARIETAL; BIOPSY: - INVASIVE SQUAMOUS CELL CARCINOMA, INCOMPLETELY EXCISED.   GROSS DESCRIPTION: A. Labeled: Lateral head parietal Received:  Formalin Collection time: 3:02 PM on 06/21/2020 Placed into formalin time: 3:05 PM on 06/21/2020 Tissue fragment(s): 1 Size: 0.5 x 0.4 cm excised to a depth of 0.6 cm Description: Received is a partial punch of tan-white skin with underlying soft tissue and hemorrhage.  The specimen is entirely inked green and bisected. Entirely submitted in 1 cassette.  Final Diagnosis performed by Betsy Pries, MD.   Electronically signed 06/25/2020 9:17:19AM The electronic signature indicates that the named Attending Pathologist has evaluated the specimen Technical component performed at [facility], [place], [place], [place] [address] Lab: [phone] Dir: Rush Farmer, MD, MMM  Professional component performed at [facility], [facility], [place], [place], [place] [address] Lab: [phone] Dir: Dellia Nims. Reuel Derby, MD     -------------------------------------------------------------------------- A&P:  Problem List Items Addressed This Visit   None   Visit Diagnoses    Balance disorder    -  Primary   Reduced vision  At high risk for falls         Balance disorder Reduced vision Difficulty with mobility post-op At high risk of falls Failed usage of cane, he is unsteady and cause imbalance nearly fallen.  Patient will benefit from a rollator walker w/ 4 wheels and seat to help maintain balance, and allow him to be mobile to perform ADLs and get around the house, to get to bathroom, bedroom, kitchen and function.  Hand written rx Rollator Walker to be faxed  [employer] [address] [place], [place] [address] Ph: [phone] Fax: [phone]   No orders of the defined types were placed in this encounter.   Follow-up: - Return as needed  Patient verbalizes understanding with the above medical recommendations including the limitation of remote medical advice.  Specific follow-up and call-back criteria were given for patient to  follow-up or seek medical care more urgently if needed.   - Time spent in direct consultation with patient on phone: 8 minutes  Nobie Putnam, [facility] 08/01/2020, 1:56 PM

## 2020-08-02 ENCOUNTER — Telehealth: Payer: Self-pay

## 2020-08-02 NOTE — Telephone Encounter (Signed)
Copied from [place] [id number]. Topic: General - Other >> Aug 01, 2020  5:01 PM Antonieta Iba C wrote: Reason for CRM: Lorriane Shire with [employer] is calling in. She says that they received the order for a walker for pt, however it didn't come through clear enough. Lorriane Shire would like to have the order re-faxed to the same fax # when possible.  Phone:  [phone]

## 2020-08-03 DIAGNOSIS — Z79899 Other long term (current) drug therapy: Secondary | ICD-10-CM | POA: Diagnosis not present

## 2020-08-03 DIAGNOSIS — Z87891 Personal history of nicotine dependence: Secondary | ICD-10-CM | POA: Diagnosis not present

## 2020-08-03 DIAGNOSIS — K219 Gastro-esophageal reflux disease without esophagitis: Secondary | ICD-10-CM | POA: Diagnosis not present

## 2020-08-03 DIAGNOSIS — I10 Essential (primary) hypertension: Secondary | ICD-10-CM | POA: Diagnosis not present

## 2020-08-03 DIAGNOSIS — E785 Hyperlipidemia, unspecified: Secondary | ICD-10-CM | POA: Diagnosis not present

## 2020-08-03 DIAGNOSIS — J432 Centrilobular emphysema: Secondary | ICD-10-CM | POA: Diagnosis not present

## 2020-08-03 DIAGNOSIS — Z483 Aftercare following surgery for neoplasm: Secondary | ICD-10-CM | POA: Diagnosis not present

## 2020-08-03 DIAGNOSIS — Z9181 History of falling: Secondary | ICD-10-CM | POA: Diagnosis not present

## 2020-08-03 DIAGNOSIS — Z7984 Long term (current) use of oral hypoglycemic drugs: Secondary | ICD-10-CM | POA: Diagnosis not present

## 2020-08-03 DIAGNOSIS — C4442 Squamous cell carcinoma of skin of scalp and neck: Secondary | ICD-10-CM | POA: Diagnosis not present

## 2020-08-03 DIAGNOSIS — E1136 Type 2 diabetes mellitus with diabetic cataract: Secondary | ICD-10-CM | POA: Diagnosis not present

## 2020-08-03 DIAGNOSIS — G4730 Sleep apnea, unspecified: Secondary | ICD-10-CM | POA: Diagnosis not present

## 2020-08-03 DIAGNOSIS — Z4801 Encounter for change or removal of surgical wound dressing: Secondary | ICD-10-CM | POA: Diagnosis not present

## 2020-08-06 DIAGNOSIS — I10 Essential (primary) hypertension: Secondary | ICD-10-CM | POA: Diagnosis not present

## 2020-08-06 DIAGNOSIS — Z9181 History of falling: Secondary | ICD-10-CM | POA: Diagnosis not present

## 2020-08-06 DIAGNOSIS — E1136 Type 2 diabetes mellitus with diabetic cataract: Secondary | ICD-10-CM | POA: Diagnosis not present

## 2020-08-06 DIAGNOSIS — J432 Centrilobular emphysema: Secondary | ICD-10-CM | POA: Diagnosis not present

## 2020-08-06 DIAGNOSIS — C4442 Squamous cell carcinoma of skin of scalp and neck: Secondary | ICD-10-CM | POA: Diagnosis not present

## 2020-08-06 DIAGNOSIS — Z7984 Long term (current) use of oral hypoglycemic drugs: Secondary | ICD-10-CM | POA: Diagnosis not present

## 2020-08-06 DIAGNOSIS — Z87891 Personal history of nicotine dependence: Secondary | ICD-10-CM | POA: Diagnosis not present

## 2020-08-06 DIAGNOSIS — K219 Gastro-esophageal reflux disease without esophagitis: Secondary | ICD-10-CM | POA: Diagnosis not present

## 2020-08-06 DIAGNOSIS — Z4801 Encounter for change or removal of surgical wound dressing: Secondary | ICD-10-CM | POA: Diagnosis not present

## 2020-08-06 DIAGNOSIS — Z79899 Other long term (current) drug therapy: Secondary | ICD-10-CM | POA: Diagnosis not present

## 2020-08-06 DIAGNOSIS — G4730 Sleep apnea, unspecified: Secondary | ICD-10-CM | POA: Diagnosis not present

## 2020-08-06 DIAGNOSIS — Z483 Aftercare following surgery for neoplasm: Secondary | ICD-10-CM | POA: Diagnosis not present

## 2020-08-06 DIAGNOSIS — E785 Hyperlipidemia, unspecified: Secondary | ICD-10-CM | POA: Diagnosis not present

## 2020-08-07 DIAGNOSIS — J432 Centrilobular emphysema: Secondary | ICD-10-CM | POA: Diagnosis not present

## 2020-08-07 DIAGNOSIS — T8131XA Disruption of external operation (surgical) wound, not elsewhere classified, initial encounter: Secondary | ICD-10-CM | POA: Diagnosis not present

## 2020-08-07 DIAGNOSIS — Z4801 Encounter for change or removal of surgical wound dressing: Secondary | ICD-10-CM | POA: Diagnosis not present

## 2020-08-07 DIAGNOSIS — Z79899 Other long term (current) drug therapy: Secondary | ICD-10-CM | POA: Diagnosis not present

## 2020-08-07 DIAGNOSIS — K219 Gastro-esophageal reflux disease without esophagitis: Secondary | ICD-10-CM | POA: Diagnosis not present

## 2020-08-07 DIAGNOSIS — G4730 Sleep apnea, unspecified: Secondary | ICD-10-CM | POA: Diagnosis not present

## 2020-08-07 DIAGNOSIS — I10 Essential (primary) hypertension: Secondary | ICD-10-CM | POA: Diagnosis not present

## 2020-08-07 DIAGNOSIS — Z87891 Personal history of nicotine dependence: Secondary | ICD-10-CM | POA: Diagnosis not present

## 2020-08-07 DIAGNOSIS — E1136 Type 2 diabetes mellitus with diabetic cataract: Secondary | ICD-10-CM | POA: Diagnosis not present

## 2020-08-07 DIAGNOSIS — C4442 Squamous cell carcinoma of skin of scalp and neck: Secondary | ICD-10-CM | POA: Diagnosis not present

## 2020-08-07 DIAGNOSIS — Z9181 History of falling: Secondary | ICD-10-CM | POA: Diagnosis not present

## 2020-08-07 DIAGNOSIS — E785 Hyperlipidemia, unspecified: Secondary | ICD-10-CM | POA: Diagnosis not present

## 2020-08-07 DIAGNOSIS — Z7984 Long term (current) use of oral hypoglycemic drugs: Secondary | ICD-10-CM | POA: Diagnosis not present

## 2020-08-07 DIAGNOSIS — Z483 Aftercare following surgery for neoplasm: Secondary | ICD-10-CM | POA: Diagnosis not present

## 2020-08-08 DIAGNOSIS — T8131XA Disruption of external operation (surgical) wound, not elsewhere classified, initial encounter: Secondary | ICD-10-CM | POA: Diagnosis not present

## 2020-08-09 DIAGNOSIS — C4442 Squamous cell carcinoma of skin of scalp and neck: Secondary | ICD-10-CM | POA: Diagnosis not present

## 2020-08-09 DIAGNOSIS — Z79899 Other long term (current) drug therapy: Secondary | ICD-10-CM | POA: Diagnosis not present

## 2020-08-09 DIAGNOSIS — K219 Gastro-esophageal reflux disease without esophagitis: Secondary | ICD-10-CM | POA: Diagnosis not present

## 2020-08-09 DIAGNOSIS — Z9181 History of falling: Secondary | ICD-10-CM | POA: Diagnosis not present

## 2020-08-09 DIAGNOSIS — Z483 Aftercare following surgery for neoplasm: Secondary | ICD-10-CM | POA: Diagnosis not present

## 2020-08-09 DIAGNOSIS — Z4801 Encounter for change or removal of surgical wound dressing: Secondary | ICD-10-CM | POA: Diagnosis not present

## 2020-08-09 DIAGNOSIS — E785 Hyperlipidemia, unspecified: Secondary | ICD-10-CM | POA: Diagnosis not present

## 2020-08-09 DIAGNOSIS — I10 Essential (primary) hypertension: Secondary | ICD-10-CM | POA: Diagnosis not present

## 2020-08-09 DIAGNOSIS — Z87891 Personal history of nicotine dependence: Secondary | ICD-10-CM | POA: Diagnosis not present

## 2020-08-09 DIAGNOSIS — E1136 Type 2 diabetes mellitus with diabetic cataract: Secondary | ICD-10-CM | POA: Diagnosis not present

## 2020-08-09 DIAGNOSIS — Z7984 Long term (current) use of oral hypoglycemic drugs: Secondary | ICD-10-CM | POA: Diagnosis not present

## 2020-08-09 DIAGNOSIS — G4730 Sleep apnea, unspecified: Secondary | ICD-10-CM | POA: Diagnosis not present

## 2020-08-09 DIAGNOSIS — J432 Centrilobular emphysema: Secondary | ICD-10-CM | POA: Diagnosis not present

## 2020-08-13 DIAGNOSIS — Z483 Aftercare following surgery for neoplasm: Secondary | ICD-10-CM | POA: Diagnosis not present

## 2020-08-13 DIAGNOSIS — Z87891 Personal history of nicotine dependence: Secondary | ICD-10-CM | POA: Diagnosis not present

## 2020-08-13 DIAGNOSIS — Z7984 Long term (current) use of oral hypoglycemic drugs: Secondary | ICD-10-CM | POA: Diagnosis not present

## 2020-08-13 DIAGNOSIS — Z9181 History of falling: Secondary | ICD-10-CM | POA: Diagnosis not present

## 2020-08-13 DIAGNOSIS — C4442 Squamous cell carcinoma of skin of scalp and neck: Secondary | ICD-10-CM | POA: Diagnosis not present

## 2020-08-13 DIAGNOSIS — K219 Gastro-esophageal reflux disease without esophagitis: Secondary | ICD-10-CM | POA: Diagnosis not present

## 2020-08-13 DIAGNOSIS — Z79899 Other long term (current) drug therapy: Secondary | ICD-10-CM | POA: Diagnosis not present

## 2020-08-13 DIAGNOSIS — G4730 Sleep apnea, unspecified: Secondary | ICD-10-CM | POA: Diagnosis not present

## 2020-08-13 DIAGNOSIS — Z4801 Encounter for change or removal of surgical wound dressing: Secondary | ICD-10-CM | POA: Diagnosis not present

## 2020-08-13 DIAGNOSIS — J432 Centrilobular emphysema: Secondary | ICD-10-CM | POA: Diagnosis not present

## 2020-08-13 DIAGNOSIS — E1136 Type 2 diabetes mellitus with diabetic cataract: Secondary | ICD-10-CM | POA: Diagnosis not present

## 2020-08-13 DIAGNOSIS — I10 Essential (primary) hypertension: Secondary | ICD-10-CM | POA: Diagnosis not present

## 2020-08-13 DIAGNOSIS — E785 Hyperlipidemia, unspecified: Secondary | ICD-10-CM | POA: Diagnosis not present

## 2020-08-14 NOTE — Telephone Encounter (Signed)
Lorriane Shire from [employer] called and stated she still has not received the forms she needs. Please re-fax over.

## 2020-08-16 DIAGNOSIS — K219 Gastro-esophageal reflux disease without esophagitis: Secondary | ICD-10-CM | POA: Diagnosis not present

## 2020-08-16 DIAGNOSIS — Z4801 Encounter for change or removal of surgical wound dressing: Secondary | ICD-10-CM | POA: Diagnosis not present

## 2020-08-16 DIAGNOSIS — Z09 Encounter for follow-up examination after completed treatment for conditions other than malignant neoplasm: Secondary | ICD-10-CM | POA: Diagnosis not present

## 2020-08-16 DIAGNOSIS — E785 Hyperlipidemia, unspecified: Secondary | ICD-10-CM | POA: Diagnosis not present

## 2020-08-16 DIAGNOSIS — C4442 Squamous cell carcinoma of skin of scalp and neck: Secondary | ICD-10-CM | POA: Diagnosis not present

## 2020-08-16 DIAGNOSIS — G4730 Sleep apnea, unspecified: Secondary | ICD-10-CM | POA: Diagnosis not present

## 2020-08-16 DIAGNOSIS — Z79899 Other long term (current) drug therapy: Secondary | ICD-10-CM | POA: Diagnosis not present

## 2020-08-16 DIAGNOSIS — Z483 Aftercare following surgery for neoplasm: Secondary | ICD-10-CM | POA: Diagnosis not present

## 2020-08-16 DIAGNOSIS — I10 Essential (primary) hypertension: Secondary | ICD-10-CM | POA: Diagnosis not present

## 2020-08-16 DIAGNOSIS — Z9181 History of falling: Secondary | ICD-10-CM | POA: Diagnosis not present

## 2020-08-16 DIAGNOSIS — E1136 Type 2 diabetes mellitus with diabetic cataract: Secondary | ICD-10-CM | POA: Diagnosis not present

## 2020-08-16 DIAGNOSIS — J432 Centrilobular emphysema: Secondary | ICD-10-CM | POA: Diagnosis not present

## 2020-08-16 DIAGNOSIS — Z7984 Long term (current) use of oral hypoglycemic drugs: Secondary | ICD-10-CM | POA: Diagnosis not present

## 2020-08-16 DIAGNOSIS — Z87891 Personal history of nicotine dependence: Secondary | ICD-10-CM | POA: Diagnosis not present

## 2020-08-20 DIAGNOSIS — G4730 Sleep apnea, unspecified: Secondary | ICD-10-CM | POA: Diagnosis not present

## 2020-08-20 DIAGNOSIS — E1136 Type 2 diabetes mellitus with diabetic cataract: Secondary | ICD-10-CM | POA: Diagnosis not present

## 2020-08-20 DIAGNOSIS — Z7984 Long term (current) use of oral hypoglycemic drugs: Secondary | ICD-10-CM | POA: Diagnosis not present

## 2020-08-20 DIAGNOSIS — I10 Essential (primary) hypertension: Secondary | ICD-10-CM | POA: Diagnosis not present

## 2020-08-20 DIAGNOSIS — K219 Gastro-esophageal reflux disease without esophagitis: Secondary | ICD-10-CM | POA: Diagnosis not present

## 2020-08-20 DIAGNOSIS — Z483 Aftercare following surgery for neoplasm: Secondary | ICD-10-CM | POA: Diagnosis not present

## 2020-08-20 DIAGNOSIS — Z87891 Personal history of nicotine dependence: Secondary | ICD-10-CM | POA: Diagnosis not present

## 2020-08-20 DIAGNOSIS — Z4801 Encounter for change or removal of surgical wound dressing: Secondary | ICD-10-CM | POA: Diagnosis not present

## 2020-08-20 DIAGNOSIS — J432 Centrilobular emphysema: Secondary | ICD-10-CM | POA: Diagnosis not present

## 2020-08-20 DIAGNOSIS — Z9181 History of falling: Secondary | ICD-10-CM | POA: Diagnosis not present

## 2020-08-20 DIAGNOSIS — E785 Hyperlipidemia, unspecified: Secondary | ICD-10-CM | POA: Diagnosis not present

## 2020-08-20 DIAGNOSIS — C4442 Squamous cell carcinoma of skin of scalp and neck: Secondary | ICD-10-CM | POA: Diagnosis not present

## 2020-08-20 DIAGNOSIS — Z79899 Other long term (current) drug therapy: Secondary | ICD-10-CM | POA: Diagnosis not present

## 2020-08-22 ENCOUNTER — Telehealth: Payer: Self-pay

## 2020-08-22 DIAGNOSIS — E1169 Type 2 diabetes mellitus with other specified complication: Secondary | ICD-10-CM

## 2020-08-22 MED ORDER — ACCU-CHEK FASTCLIX LANCETS MISC: 3 refills | Status: DC

## 2020-08-22 MED ORDER — ACCU-CHEK GUIDE ME W/DEVICE KIT
PACK | 0 refills | Status: AC
Start: 2020-08-22 — End: ?

## 2020-08-22 MED ORDER — ACCU-CHEK GUIDE VI STRP: ORAL_STRIP | 3 refills | Status: DC

## 2020-08-22 NOTE — Telephone Encounter (Signed)
Copied from [place] [phone]. Topic: General - Other >> Aug 21, 2020  4:11 PM P[place], Brayton Layman A wrote: Reason for CRM: Pt stated he is diabetic and needs a new sugar meter like Accu Chek sent in, Pt stated he hasn't been able to accurately take his blood sugar readings due to his device, please advise if you can send in a new one.

## 2020-08-22 NOTE — Telephone Encounter (Signed)
Sent orders new Accu-chek glucometer, and supplies  Nobie Putnam, [facility] 08/22/2020, 9:46 AM

## 2020-08-23 DIAGNOSIS — I10 Essential (primary) hypertension: Secondary | ICD-10-CM | POA: Diagnosis not present

## 2020-08-23 DIAGNOSIS — E785 Hyperlipidemia, unspecified: Secondary | ICD-10-CM | POA: Diagnosis not present

## 2020-08-23 DIAGNOSIS — K219 Gastro-esophageal reflux disease without esophagitis: Secondary | ICD-10-CM | POA: Diagnosis not present

## 2020-08-23 DIAGNOSIS — C4442 Squamous cell carcinoma of skin of scalp and neck: Secondary | ICD-10-CM | POA: Diagnosis not present

## 2020-08-23 DIAGNOSIS — G4730 Sleep apnea, unspecified: Secondary | ICD-10-CM | POA: Diagnosis not present

## 2020-08-23 DIAGNOSIS — J432 Centrilobular emphysema: Secondary | ICD-10-CM | POA: Diagnosis not present

## 2020-08-23 DIAGNOSIS — Z79899 Other long term (current) drug therapy: Secondary | ICD-10-CM | POA: Diagnosis not present

## 2020-08-23 DIAGNOSIS — E1136 Type 2 diabetes mellitus with diabetic cataract: Secondary | ICD-10-CM | POA: Diagnosis not present

## 2020-08-23 DIAGNOSIS — Z9181 History of falling: Secondary | ICD-10-CM | POA: Diagnosis not present

## 2020-08-23 DIAGNOSIS — Z7984 Long term (current) use of oral hypoglycemic drugs: Secondary | ICD-10-CM | POA: Diagnosis not present

## 2020-08-23 DIAGNOSIS — Z483 Aftercare following surgery for neoplasm: Secondary | ICD-10-CM | POA: Diagnosis not present

## 2020-08-23 DIAGNOSIS — Z4801 Encounter for change or removal of surgical wound dressing: Secondary | ICD-10-CM | POA: Diagnosis not present

## 2020-08-23 DIAGNOSIS — Z87891 Personal history of nicotine dependence: Secondary | ICD-10-CM | POA: Diagnosis not present

## 2020-08-27 ENCOUNTER — Emergency Department
Admission: EM | Admit: 2020-08-27 | Discharge: 2020-08-27 | Disposition: A | Discharge status: Home / Self Care | Payer: Medicare Other | Attending: Emergency Medicine | Admitting: Emergency Medicine

## 2020-08-27 ENCOUNTER — Other Ambulatory Visit: Payer: Self-pay

## 2020-08-27 ENCOUNTER — Ambulatory Visit: Payer: Self-pay | Admitting: *Deleted

## 2020-08-27 DIAGNOSIS — D649 Anemia, unspecified: Secondary | ICD-10-CM

## 2020-08-27 DIAGNOSIS — E1136 Type 2 diabetes mellitus with diabetic cataract: Secondary | ICD-10-CM | POA: Diagnosis not present

## 2020-08-27 DIAGNOSIS — E1165 Type 2 diabetes mellitus with hyperglycemia: Secondary | ICD-10-CM | POA: Diagnosis not present

## 2020-08-27 DIAGNOSIS — J432 Centrilobular emphysema: Secondary | ICD-10-CM | POA: Diagnosis not present

## 2020-08-27 DIAGNOSIS — Z79899 Other long term (current) drug therapy: Secondary | ICD-10-CM | POA: Diagnosis not present

## 2020-08-27 DIAGNOSIS — I10 Essential (primary) hypertension: Secondary | ICD-10-CM | POA: Insufficient documentation

## 2020-08-27 DIAGNOSIS — J449 Chronic obstructive pulmonary disease, unspecified: Secondary | ICD-10-CM | POA: Insufficient documentation

## 2020-08-27 DIAGNOSIS — E785 Hyperlipidemia, unspecified: Secondary | ICD-10-CM | POA: Diagnosis not present

## 2020-08-27 DIAGNOSIS — R739 Hyperglycemia, unspecified: Secondary | ICD-10-CM

## 2020-08-27 DIAGNOSIS — Z87891 Personal history of nicotine dependence: Secondary | ICD-10-CM | POA: Insufficient documentation

## 2020-08-27 DIAGNOSIS — K219 Gastro-esophageal reflux disease without esophagitis: Secondary | ICD-10-CM | POA: Diagnosis not present

## 2020-08-27 DIAGNOSIS — G4730 Sleep apnea, unspecified: Secondary | ICD-10-CM | POA: Diagnosis not present

## 2020-08-27 DIAGNOSIS — Z483 Aftercare following surgery for neoplasm: Secondary | ICD-10-CM | POA: Diagnosis not present

## 2020-08-27 DIAGNOSIS — Z9181 History of falling: Secondary | ICD-10-CM | POA: Diagnosis not present

## 2020-08-27 DIAGNOSIS — Z7984 Long term (current) use of oral hypoglycemic drugs: Secondary | ICD-10-CM | POA: Diagnosis not present

## 2020-08-27 DIAGNOSIS — C4442 Squamous cell carcinoma of skin of scalp and neck: Secondary | ICD-10-CM | POA: Diagnosis not present

## 2020-08-27 DIAGNOSIS — N179 Acute kidney failure, unspecified: Secondary | ICD-10-CM | POA: Insufficient documentation

## 2020-08-27 DIAGNOSIS — Z4801 Encounter for change or removal of surgical wound dressing: Secondary | ICD-10-CM | POA: Diagnosis not present

## 2020-08-27 LAB — CBG MONITORING, ED
Glucose-Capillary: 403 mg/dL — ABNORMAL HIGH (ref 70–99)
Glucose-Capillary: 303 mg/dL — ABNORMAL HIGH (ref 70–99)

## 2020-08-27 LAB — URINALYSIS, COMPLETE (UACMP) WITH MICROSCOPIC
Bacteria, UA: NONE SEEN
Bilirubin Urine: NEGATIVE
Glucose, UA: >=500 mg/dL — AB
Hgb urine dipstick: NEGATIVE
Ketones, ur: NEGATIVE mg/dL
Leukocytes,Ua: NEGATIVE
Nitrite: NEGATIVE
Protein, ur: NEGATIVE mg/dL
Specific Gravity, Urine: 1.022 (ref 1.005–1.030)
pH: 5 (ref 5.0–8.0)

## 2020-08-27 LAB — HEMOGLOBIN A1C
Hgb A1c MFr Bld: 10.4 % — ABNORMAL HIGH (ref 4.8–5.6)
Mean Plasma Glucose: 251.78 mg/dL

## 2020-08-27 LAB — BASIC METABOLIC PANEL
Anion gap: 10 (ref 5–15)
BUN: 32 mg/dL — ABNORMAL HIGH (ref 8–23)
CO2: 21 mmol/L — ABNORMAL LOW (ref 22–32)
Calcium: 8 mg/dL — ABNORMAL LOW (ref 8.9–10.3)
Chloride: 99 mmol/L (ref 98–111)
Creatinine, Ser: 1.6 mg/dL — ABNORMAL HIGH (ref 0.61–1.24)
GFR, Estimated: 47 mL/min — ABNORMAL LOW (ref >60)
Glucose, Bld: 444 mg/dL — ABNORMAL HIGH (ref 70–99)
Potassium: 4.3 mmol/L (ref 3.5–5.1)
Sodium: 130 mmol/L — ABNORMAL LOW (ref 135–145)

## 2020-08-27 LAB — CBC
HCT: 25.6 % — ABNORMAL LOW (ref 39.0–52.0)
Hemoglobin: 9 g/dL — ABNORMAL LOW (ref 13.0–17.0)
MCH: 28.7 pg (ref 26.0–34.0)
MCHC: 35.2 g/dL (ref 30.0–36.0)
MCV: 81.5 fL (ref 80.0–100.0)
Platelets: 229 10*3/uL (ref 150–400)
RBC: 3.14 MIL/uL — ABNORMAL LOW (ref 4.22–5.81)
RDW: 13.2 % (ref 11.5–15.5)
WBC: 7.9 10*3/uL (ref 4.0–10.5)
nRBC: 0 % (ref 0.0–0.2)

## 2020-08-27 MED ORDER — LACTATED RINGERS IV BOLUS
1000.0000 mL | Freq: Once | INTRAVENOUS | Status: AC
  Administered 2020-08-27: 1000 mL via INTRAVENOUS

## 2020-08-27 MED ORDER — IRON (FERROUS SULFATE) 325 (65 FE) MG PO TABS: 1.0000 | ORAL_TABLET | Freq: Every day | ORAL | 0 refills | Status: DC

## 2020-08-27 MED ORDER — INSULIN ASPART 100 UNIT/ML ~~LOC~~ SOLN
0.0000 [IU] | SUBCUTANEOUS | Status: DC
  Administered 2020-08-27: 11 [IU] via SUBCUTANEOUS
  Filled 2020-08-27: qty 1

## 2020-08-27 NOTE — ED Triage Notes (Signed)
Pt comes with c/o hyperglycemia since having skin cancer removed from his scalp. Pt states he has never had issues like this before.  Pt states his BS has been running between 300-400s.

## 2020-08-27 NOTE — ED Provider Notes (Signed)
[facility] Emergency Department Provider Note  ____________________________________________   Event Date/Time   First MD Initiated Contact with Patient 08/27/20 1122     (approximate)  I have reviewed the triage vital signs and the nursing notes.   HISTORY  Chief Complaint Hyperglycemia   HPI [name] is a 65 y.o. male with past medical history anxiety, COPD, DM, GERD, and hypertension, insomnia and previous tobacco abuse as well as recent scalp surgery with removal of skin cancer about a week ago presents for assessment of elevated sugar since then.  Patient states he was vomiting in the days immediately following the procedure whenever he tried to take any Percocet but has since stopped taking the medicine and has not had any vomiting last couple days.  He thinks he has been little dehydrated as he noticed slight decrease in his urine output and has not been felt to drink as much.  However he denies any nausea or vomiting today.  Denies any bleeding from his incisions but there has been little drainage and he has been changing his dressings as recommended.  No new headache, earache, vision changes, cough, shortness of breath, chest pain, abdominal pain, diarrhea, dysuria, blood in stool, blood in urine, rash or extremity pain recent falls or injuries.  No other acute concerns at this time.         Past Medical History:  Diagnosis Date  . Anxiety   . COPD (chronic obstructive pulmonary disease) ([place])   . Diabetes mellitus without complication ([place])   . GERD (gastroesophageal reflux disease)   . Hypertension   . Insomnia   . Personal history of tobacco use, presenting hazards to health 05/31/2015    Patient Active Problem List   Diagnosis Date Noted  . Chronic obstructive pulmonary disease ([place]) 05/03/2020  . Skin infection 01/10/2020  . Sleep apnea 12/13/2019  . Anxiety 11/01/2019  . Primary insomnia 07/27/2018  . Type 2 diabetes mellitus  with other specified complication ([place]) 78/67/5449  . Cataract associated with type 2 diabetes mellitus ([place]) 07/27/2018  . Gastroesophageal reflux disease 07/27/2018  . Essential hypertension 07/27/2018  . Centrilobular emphysema ([place]) 07/27/2018  . Hyperlipidemia 07/27/2018  . Personal history of tobacco use, presenting hazards to health 05/31/2015  . Chronic hepatitis C without hepatic coma ([place]) 06/08/2014    Past Surgical History:  Procedure Laterality Date  . COLONOSCOPY WITH PROPOFOL N/A 10/20/2019   Procedure: COLONOSCOPY WITH PROPOFOL;  Surgeon: Jonathon Bellows, MD;  Location: [facility] ENDOSCOPY;  Service: Gastroenterology;  Laterality: N/A;  . HERNIA REPAIR Left 2003    Prior to Admission medications   Medication Sig Start Date End Date Taking? Authorizing Provider  Iron, Ferrous Sulfate, 325 (65 Fe) MG TABS Take 1 tablet by mouth daily. 08/27/20 09/26/20 Yes Lucrezia Starch, MD  Accu-Chek FastClix Lancets MISC Use to check blood sugar up to twice a day 08/22/20   Olin Hauser, DO  ACCU-CHEK GUIDE test strip Use to check blood sugar up to twice a day 08/22/20   Olin Hauser, DO  albuterol (PROVENTIL) (2.5 MG/3ML) 0.083% nebulizer solution Take 3 mLs (2.5 mg total) by nebulization every 6 (six) hours as needed for wheezing or shortness of breath. 05/03/20   Malfi, Lupita Raider, FNP  atorvastatin (LIPITOR) 40 MG tablet TAKE 1 TABLET BY MOUTH AT BEDTIME FOR HIGH CHOLESTEROL 07/31/20   Karamalegos, Devonne Doughty, DO  Bismuth Tribromoph-Petrolatum (XEROFORM OIL EMULSION STRIP) MISC Apply directly to wound, change daily, use non  adherent pad on top Patient not taking: Reported on 08/01/2020 07/19/20   Olin Hauser, DO  Blood Glucose Monitoring Suppl (ACCU-CHEK GUIDE ME) w/Device KIT Use to check blood sugar up to 2 times daily 08/22/20   Parks Ranger, Devonne Doughty, DO  busPIRone (BUSPAR) 5 MG tablet Take 1 tablet (5 mg total) by mouth 3 (three) times daily. Patient not taking:  Reported on 08/01/2020 05/03/20   Verl Bangs, FNP  hydrochlorothiazide (MICROZIDE) 12.5 MG capsule TAKE ONE CAPSULE BY MOUTH ONCE DAILY FOR HIGH BLOOD PRESSURE 07/30/20   Parks Ranger, Devonne Doughty, DO  HYDROcodone-acetaminophen (NORCO/VICODIN) 5-325 MG tablet Take by mouth. Patient not taking: Reported on 08/01/2020 07/12/20   [provider]  lisinopril (ZESTRIL) 20 MG tablet Take 1 tablet (20 mg total) by mouth daily. for high blood pressure 07/31/20   Karamalegos, Devonne Doughty, DO  Melatonin 10 MG CAPS Take by mouth.    [provider]  metFORMIN (GLUCOPHAGE) 1000 MG tablet Take 1 tablet twice daily with food for Diabetes 07/30/20   Olin Hauser, DO  Multiple Vitamins-Minerals (VITAMIN D3 COMPLETE PO) Take by mouth.  Patient not taking: Reported on 08/01/2020    [provider]  omeprazole (PRILOSEC) 20 MG capsule Take 1 capsule (20 mg total) by mouth daily. 07/31/20   Karamalegos, Devonne Doughty, DO  oxyCODONE (OXY IR/ROXICODONE) 5 MG immediate release tablet Take 5 mg by mouth every 6 (six) hours as needed. 07/31/20   [provider]  traZODone (DESYREL) 100 MG tablet Take 0.5-1 tablets (50-100 mg total) by mouth at bedtime as needed for sleep. 05/03/20   Malfi, Lupita Raider, FNP    Allergies Codeine  Family History  Problem Relation Age of Onset  . Renal Disease Mother 66  . Heart disease Father   . Stroke Father   . Alzheimer's disease Father   . Stroke Brother   . Diabetes Brother   . Diabetes Maternal Grandfather     Social History Social History   Tobacco Use  . Smoking status: Former Smoker    Packs/day: 1.00    Years: 40.00    Pack years: 40.00    Types: Cigarettes    Quit date: 07/25/2018    Years since quitting: 2.0  . Smokeless tobacco: Former Network engineer  . Vaping Use: Never used  Substance Use Topics  . Alcohol use: Not Currently    Comment: quit 2003, drank about 5 beer per week prior  . Drug use: Never    Review of  Systems  Review of Systems  Constitutional: Positive for malaise/fatigue. Negative for chills and fever.  HENT: Negative for sore throat.   Eyes: Negative for pain.  Respiratory: Negative for cough and stridor.   Cardiovascular: Negative for chest pain.  Gastrointestinal: Negative for vomiting ( last week, not in last couple of days).  Genitourinary: Negative for dysuria.  Musculoskeletal: Negative for myalgias.  Skin: Negative for rash.  Neurological: Negative for seizures, loss of consciousness and headaches.  Psychiatric/Behavioral: Negative for suicidal ideas.  All other systems reviewed and are negative.     ____________________________________________   PHYSICAL EXAM:  VITAL SIGNS: ED Triage Vitals  Enc Vitals Group     BP 08/27/20 1025 137/78     Pulse Rate 08/27/20 1025 85     Resp 08/27/20 1025 18     Temp 08/27/20 1025 98 F (36.7 C)     Temp src --      SpO2 08/27/20 1025 99 %  Weight 08/27/20 1027 143 lb (64.9 kg)     Height 08/27/20 1027 '5\' 5"'  (1.651 m)     Head Circumference --      Peak Flow --      Pain Score 08/27/20 1027 0     Pain Loc --      Pain Edu? --      Excl. in [place]? --    Vitals:   08/27/20 1145 08/27/20 1318  BP: 126/75 (!) 135/91  Pulse: 75 86  Resp: 16 16  Temp: 98.2 F (36.8 C) 98.1 F (36.7 C)  SpO2: 98% 100%   Physical Exam Vitals and nursing note reviewed.  Constitutional:      Appearance: He is well-developed.  HENT:     Head: Normocephalic.     Right Ear: External ear normal.     Left Ear: External ear normal.     Nose: Nose normal.  Eyes:     Conjunctiva/sclera: Conjunctivae normal.  Cardiovascular:     Rate and Rhythm: Normal rate and regular rhythm.     Heart sounds: No murmur heard.   Pulmonary:     Effort: Pulmonary effort is normal. No respiratory distress.     Breath sounds: Normal breath sounds.  Abdominal:     Palpations: Abdomen is soft.     Tenderness: There is no abdominal tenderness.   Musculoskeletal:     Cervical back: Neck supple.  Skin:    General: Skin is warm and dry.     Capillary Refill: Capillary refill takes less than 2 seconds.  Neurological:     Mental Status: He is alert and oriented to person, place, and time.  Psychiatric:        Mood and Affect: Mood normal.     Scalp incisions appear clean dry and intact.  No surrounding erythema bleeding fluctuance or significant drainage.  Oropharynx unremarkable. ____________________________________________   LABS (all labs ordered are listed, but only abnormal results are displayed)  Labs Reviewed  BASIC METABOLIC PANEL - Abnormal; Notable for the following components:      Result Value   Sodium 130 (*)    CO2 21 (*)    Glucose, Bld 444 (*)    BUN 32 (*)    Creatinine, Ser 1.60 (*)    Calcium 8.0 (*)    GFR, Estimated 47 (*)    All other components within normal limits  CBC - Abnormal; Notable for the following components:   RBC 3.14 (*)    Hemoglobin 9.0 (*)    HCT 25.6 (*)    All other components within normal limits  URINALYSIS, COMPLETE (UACMP) WITH MICROSCOPIC - Abnormal; Notable for the following components:   Color, Urine YELLOW (*)    APPearance CLEAR (*)    Glucose, UA >=500 (*)    All other components within normal limits  CBG MONITORING, ED - Abnormal; Notable for the following components:   Glucose-Capillary 403 (*)    All other components within normal limits  CBG MONITORING, ED - Abnormal; Notable for the following components:   Glucose-Capillary 303 (*)    All other components within normal limits  HEMOGLOBIN A1C   ____________________________________________  EKG   ____________________________________________  RADIOLOGY  ED MD interpretation:    Official radiology report(s): No results found.  ____________________________________________   PROCEDURES  Procedure(s) performed (including Critical  Care):  Procedures   ____________________________________________   INITIAL IMPRESSION / ASSESSMENT AND PLAN / ED COURSE      Patient presents with  above to history exam for assessment of some elevated blood sugars he has seen at home over last couple days.  He is a non-insulin-dependent diabetic typically only on Metformin but has had some nausea and vomiting a week ago after recent scalp surgery that he thinks was exacerbated by his Percocets.  He has since stopped taking this and has had no more nausea and vomiting but has had not much of an appetite and thinks his urine output might be a little decreased last couple days.  On arrival he is afebrile hemodynamically stable.  His scalp incision appears clean dry and healing appropriately and there is no evidence of cellulitis or early developing infection.  No history of interim trauma or other historical or exam findings to suggest acute infectious process.  CBC obtained in triage shows no leukocytosis and anemia with hemoglobin of 9 compared to 13 6 months ago.  I suspect acute blood loss anemia secondary to his recent surgery.  We will start patient on iron and have him follow-up with his PCP to have this rechecked in a couple of days.  His BMP remarkable for glucose of 444 and AKI with a creatinine of 1.6 compared to 1.236 months ago.  Without evidence of DKA or other significant electrolyte or metabolic derangements.   Suspect patient's AKI and elevated sugars are secondary to decreased p.o. intake and some stress from recent surgery.  In addition it seems he had some vomiting 5 to 7 days ago that is now resolved and patient is now tolerating p.o.  He was given IV fluid bolus and a small amount of insulin with recheck of sugars noted to be downtrending.  Had extensive discussion with patient and friend at bedside regarding importance of having his kidney function rechecked in 2 to 3 days and discussing with his PCP whether he needs to have his  diabetes medications adjusted or additional medicines added given his likely experiencing some stress hyperglycemia.  Given stable vitals with otherwise reassuring exam and work-up low suspicion for other immediate life-threatening pathology I believe he is safe for discharge with his plan for very close outpatient PCP follow-up.  Patient is amenable to this.  He was discharged stable condition.  Strict return cautions advised and discussed.     ____________________________________________   FINAL CLINICAL IMPRESSION(S) / ED DIAGNOSES  Final diagnoses:  AKI (acute kidney injury) ([place])  Hyperglycemia  Anemia, unspecified type    Medications  insulin aspart (novoLOG) injection 0-15 Units (11 Units Subcutaneous Given 08/27/20 1234)  lactated ringers bolus 1,000 mL (1,000 mLs Intravenous Bolus 08/27/20 1143)     ED Discharge Orders         Ordered    Iron, Ferrous Sulfate, 325 (65 Fe) MG TABS  Daily        08/27/20 1158           Note:  This document was prepared using Dragon voice recognition software and may include unintentional dictation errors.   Lucrezia Starch, MD 08/27/20 [phone]

## 2020-08-27 NOTE — ED Notes (Signed)
Lab contacted to add on hemoglobin a1c

## 2020-08-27 NOTE — ED Notes (Signed)
Signature pad not available, pt verbalizes understanding of d/c instructions, denies questions or concerns

## 2020-08-27 NOTE — ED Notes (Signed)
Wound on head dressed, ok per Dr Tamala Julian

## 2020-08-27 NOTE — ED Notes (Signed)
Presents w/ hyperglycemia since approximately last week. Vomiting "all last week". Pt states he stopped taking pain meds and stopped vomiting. Decreased urination. Pt endorses taking his meds as rx'd.

## 2020-08-27 NOTE — Telephone Encounter (Signed)
Pt reports BS 300-400's x 1 week. Reports last evening 437, checked during call 470. Reports frequent urination, dizziness "Not right in the head lately." NO missed medications.  [place] nurse Pam with [facility] arrived during call. States pt had scalp surgery, squamous cell, "The wound has been draining thick yellow drainage 2-3 weeks." Pt afebrile.Directed to ED. States will follow disposition, son will drive.  Reason for Disposition . Blood glucose > 500 mg/dL (27.8 mmol/L)    BS 470, dizzy, frequent urination  Answer Assessment - Initial Assessment Questions 1. BLOOD GLUCOSE: "What is your blood glucose level?"     300-400's 2. ONSET: "When did you check the blood glucose?"     437, last night 3. USUAL RANGE: "What is your glucose level usually?" (e.g., usual fasting morning value, usual evening value)     130 some 4. KETONES: "Do you check for ketones (urine or blood test strips)?" If yes, ask: "What does the test show now?"     no 5. TYPE 1 or 2:  "Do you know what type of diabetes you have?"  (e.g., Type 1, Type 2, Gestational; doesn't know)       6. INSULIN: "Do you take insulin?" "What type of insulin(s) do you use? What is the mode of delivery? (syringe, pen; injection or pump)?"      no 7. DIABETES PILLS: "Do you take any pills for your diabetes?" If yes, ask: "Have you missed taking any pills recently?"    No missed doses 8. OTHER SYMPTOMS: "Do you have any symptoms?" (e.g., fever, frequent urination, difficulty breathing, dizziness, weakness, vomiting)     Dizziness,sitting to standing, onset 1 week ago, frequent urination.  Protocols used: DIABETES - HIGH BLOOD SUGAR-A-AH

## 2020-08-28 LAB — CBG MONITORING, ED: Glucose-Capillary: 334 mg/dL — ABNORMAL HIGH (ref 70–99)

## 2020-08-30 DIAGNOSIS — C4442 Squamous cell carcinoma of skin of scalp and neck: Secondary | ICD-10-CM | POA: Diagnosis not present

## 2020-08-30 DIAGNOSIS — E785 Hyperlipidemia, unspecified: Secondary | ICD-10-CM | POA: Diagnosis not present

## 2020-08-30 DIAGNOSIS — I10 Essential (primary) hypertension: Secondary | ICD-10-CM | POA: Diagnosis not present

## 2020-08-30 DIAGNOSIS — G4730 Sleep apnea, unspecified: Secondary | ICD-10-CM | POA: Diagnosis not present

## 2020-08-30 DIAGNOSIS — K219 Gastro-esophageal reflux disease without esophagitis: Secondary | ICD-10-CM | POA: Diagnosis not present

## 2020-08-30 DIAGNOSIS — Z4801 Encounter for change or removal of surgical wound dressing: Secondary | ICD-10-CM | POA: Diagnosis not present

## 2020-08-30 DIAGNOSIS — Z483 Aftercare following surgery for neoplasm: Secondary | ICD-10-CM | POA: Diagnosis not present

## 2020-08-30 DIAGNOSIS — E1136 Type 2 diabetes mellitus with diabetic cataract: Secondary | ICD-10-CM | POA: Diagnosis not present

## 2020-08-30 DIAGNOSIS — Z7984 Long term (current) use of oral hypoglycemic drugs: Secondary | ICD-10-CM | POA: Diagnosis not present

## 2020-08-30 DIAGNOSIS — J432 Centrilobular emphysema: Secondary | ICD-10-CM | POA: Diagnosis not present

## 2020-08-30 DIAGNOSIS — Z9181 History of falling: Secondary | ICD-10-CM | POA: Diagnosis not present

## 2020-08-30 DIAGNOSIS — Z87891 Personal history of nicotine dependence: Secondary | ICD-10-CM | POA: Diagnosis not present

## 2020-08-30 DIAGNOSIS — Z79899 Other long term (current) drug therapy: Secondary | ICD-10-CM | POA: Diagnosis not present

## 2020-08-31 ENCOUNTER — Encounter: Payer: Self-pay | Admitting: Family Medicine

## 2020-08-31 ENCOUNTER — Other Ambulatory Visit: Payer: Self-pay

## 2020-08-31 ENCOUNTER — Ambulatory Visit (INDEPENDENT_AMBULATORY_CARE_PROVIDER_SITE_OTHER): Payer: Medicare Other | Admitting: Family Medicine

## 2020-08-31 VITALS — BP 118/65 | HR 98 | Ht 65.0 in | Wt 150.2 lb

## 2020-08-31 DIAGNOSIS — E1169 Type 2 diabetes mellitus with other specified complication: Secondary | ICD-10-CM | POA: Diagnosis not present

## 2020-08-31 MED ORDER — OZEMPIC (0.25 OR 0.5 MG/DOSE) 2 MG/1.5ML ~~LOC~~ SOPN: 0.2500 mg | PEN_INJECTOR | SUBCUTANEOUS | 0 refills | Status: DC

## 2020-08-31 NOTE — Progress Notes (Signed)
Subjective:    Patient ID: [name], male    DOB: Jun 11, 1955, 65 y.o.   MRN: [id number]  [name] is a 65 y.o. male presenting on 08/31/2020 for Diabetes   HPI   CHRONIC DM, Type 2: Hyperglycemia up to A1c 10.4, previously 6.5 to 7.1 range last in 72/5366 He has had complication with skin infection with superficial scalp lesion excision.  CBGs: Avg 120s, Low >100, High <200. Checks CBGs x 1 daily Meds: Metformin 1000mg  BID Reports good compliance. Tolerating well w/o side-effects Currently on ACEi / Statin Lifestyle: - Diet (balanced low sugar, reduced carbs/starches)  - Exercise (walking twice a day) - Unable to schedule DM Eye visit with eye doctor locally, he will try to schedule soon, now that restrictions from coronavirus pandemic are less Denies hypoglycemia, polyuria, visual changes, numbness or tingling.  Scalp SCC s/p excision Followed by [facility]    Depression screen [facility] 2/9 08/01/2019 02/10/2019 11/08/2018  Decreased Interest 3 1 1   Down, Depressed, Hopeless 1 0 1  PHQ - 2 Score 4 1 2   Altered sleeping 2 3 0  Tired, decreased energy 3 3 2   Change in appetite 0 0 1  Feeling bad or failure about yourself  0 0 0  Trouble concentrating 3 1 0  Moving slowly or fidgety/restless 0 0 2  Suicidal thoughts 0 0 0  PHQ-9 Score 12 8 7   Difficult doing work/chores Somewhat difficult Not difficult at all Not difficult at all    Social History   Tobacco Use  . Smoking status: Former Smoker    Packs/day: 1.00    Years: 40.00    Pack years: 40.00    Types: Cigarettes    Quit date: 07/25/2018    Years since quitting: 2.1  . Smokeless tobacco: Former Network engineer  . Vaping Use: Never used  Substance Use Topics  . Alcohol use: Not Currently    Comment: quit 2003, drank about 5 beer per week prior  . Drug use: Never    Review of Systems Per HPI unless specifically indicated above     Objective:    BP 118/65   Pulse 98   Ht 5\' 5"  (1.651 m)    Wt 150 lb 3.2 oz (68.1 kg)   SpO2 100%   BMI 24.99 kg/m   Wt Readings from Last 3 Encounters:  08/31/20 150 lb 3.2 oz (68.1 kg)  08/27/20 143 lb (64.9 kg)  07/19/20 158 lb 6.4 oz (71.8 kg)    Physical Exam Vitals and nursing note reviewed.  Constitutional:      General: He is not in acute distress.    Appearance: He is well-developed. He is not diaphoretic.     Comments: Well-appearing, comfortable, cooperative  HENT:     Head: Normocephalic and atraumatic.  Eyes:     General:        Right eye: No discharge.        Left eye: No discharge.     Conjunctiva/sclera: Conjunctivae normal.  Cardiovascular:     Rate and Rhythm: Normal rate.  Pulmonary:     Effort: Pulmonary effort is normal.  Skin:    General: Skin is warm and dry.     Findings: Lesion (scalp lesion from skin excision, dressing intact today, did not remove) present. No erythema or rash.  Neurological:     Mental Status: He is alert and oriented to person, place, and time.  Psychiatric:  Behavior: Behavior normal.     Comments: Well groomed, good eye contact, normal speech and thoughts    Results for orders placed or performed during the hospital encounter of 42/59/56  Basic metabolic panel  Result Value Ref Range   Sodium 130 (L) 135 - 145 mmol/L   Potassium 4.3 3.5 - 5.1 mmol/L   Chloride 99 98 - 111 mmol/L   CO2 21 (L) 22 - 32 mmol/L   Glucose, Bld 444 (H) 70 - 99 mg/dL   BUN 32 (H) 8 - 23 mg/dL   Creatinine, Ser 1.60 (H) 0.61 - 1.24 mg/dL   Calcium 8.0 (L) 8.9 - 10.3 mg/dL   GFR, Estimated 47 (L) >60 mL/min   Anion gap 10 5 - 15  CBC  Result Value Ref Range   WBC 7.9 4.0 - 10.5 K/uL   RBC 3.14 (L) 4.22 - 5.81 MIL/uL   Hemoglobin 9.0 (L) 13.0 - 17.0 g/dL   HCT 25.6 (L) 39.0 - 52.0 %   MCV 81.5 80.0 - 100.0 fL   MCH 28.7 26.0 - 34.0 pg   MCHC 35.2 30.0 - 36.0 g/dL   RDW 13.2 11.5 - 15.5 %   Platelets 229 150 - 400 K/uL   nRBC 0.0 0.0 - 0.2 %  Urinalysis, Complete w Microscopic  Result  Value Ref Range   Color, Urine YELLOW (A) YELLOW   APPearance CLEAR (A) CLEAR   Specific Gravity, Urine 1.022 1.005 - 1.030   pH 5.0 5.0 - 8.0   Glucose, UA >=500 (A) NEGATIVE mg/dL   Hgb urine dipstick NEGATIVE NEGATIVE   Bilirubin Urine NEGATIVE NEGATIVE   Ketones, ur NEGATIVE NEGATIVE mg/dL   Protein, ur NEGATIVE NEGATIVE mg/dL   Nitrite NEGATIVE NEGATIVE   Leukocytes,Ua NEGATIVE NEGATIVE   RBC / HPF 0-5 0 - 5 RBC/hpf   WBC, UA 0-5 0 - 5 WBC/hpf   Bacteria, UA NONE SEEN NONE SEEN   Squamous Epithelial / LPF 0-5 0 - 5   Mucus PRESENT    Hyaline Casts, UA PRESENT   Hemoglobin A1c  Result Value Ref Range   Hgb A1c MFr Bld 10.4 (H) 4.8 - 5.6 %   Mean Plasma Glucose 251.78 mg/dL  CBG monitoring, ED  Result Value Ref Range   Glucose-Capillary 403 (H) 70 - 99 mg/dL  CBG monitoring, ED  Result Value Ref Range   Glucose-Capillary 303 (H) 70 - 99 mg/dL   Comment 1 Call MD NNP PA CNM   CBG monitoring, ED  Result Value Ref Range   Glucose-Capillary 334 (H) 70 - 99 mg/dL    Recent Labs    02/07/20 1502 05/03/20 1404 08/27/20 1029  HGBA1C 6.5* 7.1* 10.4*       Assessment & Plan:   Problem List Items Addressed This Visit    Type 2 diabetes mellitus with other specified complication (HCC) - Primary   Relevant Medications   OZEMPIC, 0.25 OR 0.5 MG/DOSE, 2 MG/1.5ML SOPN      Uncontrolled DM with hyperglycemia, A1c 10.4 now likely secondary to recent skin excision and concern for some wound infection being treated by [facility] ENT[facility]. Complications - Hyperglycemia  Plan:  1. START GLP1 Ozempic sample given today 0.25mg  weekly x 4 weeks then 0.5mg  x 2 weeks for 6 week sample 2. Continue Metformin 1000mg  BID 3. Encourage improved lifestyle - low carb, low sugar diet, reduce portion size, continue improving regular exercise 4. Check CBG , bring log to next visit for review  Discussed that  may be able to come off GLP1 after the 6 weeks if his source of hyperglycemia is resolved,  otherwise if it works well and he still has higher sugars, may continue on it and may need new rx sent at that time if covered medicine.  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 We agreed to HOLD off on new insulin, only trial GLP1 therapy now   Meds ordered this encounter  Medications  . OZEMPIC, 0.25 OR 0.5 MG/DOSE, 2 MG/1.5ML SOPN    Sig: Inject 0.25 mg into the skin once a week. For first 4 weeks. Then increase dose to 0.5mg  weekly    Dispense:  1.5 mL    Refill:  0      Follow up plan: Return in about 6 weeks (around 10/12/2020) for 6 week DM, follow-up with Rollene Fare.    Nobie Putnam, [facility] 08/31/2020, 1:42 PM

## 2020-08-31 NOTE — Patient Instructions (Addendum)
Thank you for coming to the office today.  Call insurance find cost and coverage of the following   Ozempic (Semaglutide injection) - start 0.25mg  weekly for 4 weeks then increase to 0.5mg  weekly   3 benefits - 1 significantly reduced A1c sugar, and may be able to reduce or stop metformin in future - 2 reduced appetite and weight loss with good results - 3 cardiovascular risk reduction, less likely to have heart attack/stroke  Keep taking Metformin  Keep up with the skin care and treatment on the scalp lesion - likely this is raising your sugar. Once this improves, you may no longer need the extra medicine.  If still does not work over the next 2-4 weeks, we can add a short term supply of insulin, free sample.  Please schedule a Follow-up Appointment to: Return in about 6 weeks (around 10/12/2020) for 6 week DM, follow-up with Rollene Fare.  If you have any other questions or concerns, please feel free to call the office or send a message through [place]. You may also schedule an earlier appointment if necessary.  Additionally, you may be receiving a survey about your experience at our office within a few days to 1 week by e-mail or mail. We value your feedback.  Nobie Putnam, DO [place]

## 2020-09-03 DIAGNOSIS — C4442 Squamous cell carcinoma of skin of scalp and neck: Secondary | ICD-10-CM | POA: Diagnosis not present

## 2020-09-03 DIAGNOSIS — Z87891 Personal history of nicotine dependence: Secondary | ICD-10-CM | POA: Diagnosis not present

## 2020-09-03 DIAGNOSIS — G4730 Sleep apnea, unspecified: Secondary | ICD-10-CM | POA: Diagnosis not present

## 2020-09-03 DIAGNOSIS — J432 Centrilobular emphysema: Secondary | ICD-10-CM | POA: Diagnosis not present

## 2020-09-03 DIAGNOSIS — E785 Hyperlipidemia, unspecified: Secondary | ICD-10-CM | POA: Diagnosis not present

## 2020-09-03 DIAGNOSIS — I10 Essential (primary) hypertension: Secondary | ICD-10-CM | POA: Diagnosis not present

## 2020-09-03 DIAGNOSIS — Z4801 Encounter for change or removal of surgical wound dressing: Secondary | ICD-10-CM | POA: Diagnosis not present

## 2020-09-03 DIAGNOSIS — Z79899 Other long term (current) drug therapy: Secondary | ICD-10-CM | POA: Diagnosis not present

## 2020-09-03 DIAGNOSIS — E1136 Type 2 diabetes mellitus with diabetic cataract: Secondary | ICD-10-CM | POA: Diagnosis not present

## 2020-09-03 DIAGNOSIS — Z483 Aftercare following surgery for neoplasm: Secondary | ICD-10-CM | POA: Diagnosis not present

## 2020-09-03 DIAGNOSIS — Z7984 Long term (current) use of oral hypoglycemic drugs: Secondary | ICD-10-CM | POA: Diagnosis not present

## 2020-09-03 DIAGNOSIS — K219 Gastro-esophageal reflux disease without esophagitis: Secondary | ICD-10-CM | POA: Diagnosis not present

## 2020-09-03 DIAGNOSIS — Z9181 History of falling: Secondary | ICD-10-CM | POA: Diagnosis not present

## 2020-09-05 ENCOUNTER — Telehealth: Payer: Self-pay

## 2020-09-05 ENCOUNTER — Ambulatory Visit: Payer: Self-pay

## 2020-09-05 DIAGNOSIS — G4730 Sleep apnea, unspecified: Secondary | ICD-10-CM | POA: Diagnosis not present

## 2020-09-05 DIAGNOSIS — Z7984 Long term (current) use of oral hypoglycemic drugs: Secondary | ICD-10-CM | POA: Diagnosis not present

## 2020-09-05 DIAGNOSIS — Z9181 History of falling: Secondary | ICD-10-CM | POA: Diagnosis not present

## 2020-09-05 DIAGNOSIS — I10 Essential (primary) hypertension: Secondary | ICD-10-CM | POA: Diagnosis not present

## 2020-09-05 DIAGNOSIS — E1169 Type 2 diabetes mellitus with other specified complication: Secondary | ICD-10-CM

## 2020-09-05 DIAGNOSIS — E1136 Type 2 diabetes mellitus with diabetic cataract: Secondary | ICD-10-CM | POA: Diagnosis not present

## 2020-09-05 DIAGNOSIS — C4442 Squamous cell carcinoma of skin of scalp and neck: Secondary | ICD-10-CM | POA: Diagnosis not present

## 2020-09-05 DIAGNOSIS — Z87891 Personal history of nicotine dependence: Secondary | ICD-10-CM | POA: Diagnosis not present

## 2020-09-05 DIAGNOSIS — J432 Centrilobular emphysema: Secondary | ICD-10-CM | POA: Diagnosis not present

## 2020-09-05 DIAGNOSIS — Z4801 Encounter for change or removal of surgical wound dressing: Secondary | ICD-10-CM | POA: Diagnosis not present

## 2020-09-05 DIAGNOSIS — E785 Hyperlipidemia, unspecified: Secondary | ICD-10-CM | POA: Diagnosis not present

## 2020-09-05 DIAGNOSIS — Z483 Aftercare following surgery for neoplasm: Secondary | ICD-10-CM | POA: Diagnosis not present

## 2020-09-05 DIAGNOSIS — K219 Gastro-esophageal reflux disease without esophagitis: Secondary | ICD-10-CM | POA: Diagnosis not present

## 2020-09-05 DIAGNOSIS — Z79899 Other long term (current) drug therapy: Secondary | ICD-10-CM | POA: Diagnosis not present

## 2020-09-05 MED ORDER — TRESIBA FLEXTOUCH 100 UNIT/ML ~~LOC~~ SOPN: 5.0000 [IU] | PEN_INJECTOR | Freq: Every day | SUBCUTANEOUS | 0 refills | Status: DC

## 2020-09-05 NOTE — Telephone Encounter (Signed)
On 08/31/20 I saw him and we started new medicine Ozempic. As I discussed with him at that time, it will take several weeks for the Ozempic to really get into his system and start to work better.  The only medicine that I could add on to his regimen right now would be a low dose Insulin that he could try a free sample of to get started and hopefully the Ozempic will be working better by then.  He can pick up a [place] insulin pen from our office and use 5 units a day for first 3 days.  He should use the insulin injection WITH a meal. Not before the meal. Once daily insulin injection.  If his sugar is still >200+ fasting in the morning when he checks it, he may go up to 10 units per day max.  He should be cautious and aware for LOW blood sugars. Any patient on insulin should be cautious if blood sugar is < 100 and if he feels sick from low sugar, sweaty, clammy, shaky, nausea, may need to seek care immediately at hospital.  This insulin will only be short term. The sample pen should have 7 needles in it, for 7 doses or 1 full week.  If he still needs it longer than 1 week, we can order him more pen needles.  He may need it for about 1 month approximately.  [name], [facility] 09/05/2020, 11:29 AM

## 2020-09-05 NOTE — Telephone Encounter (Signed)
The pt was notified of Dr. Raliegh Ip recommendation. He verbalize understanding. He will be picking up the sample later today.

## 2020-09-05 NOTE — Telephone Encounter (Signed)
Pt. Reports Dr. Parks Ranger started him on Ozempic last week. Blood sugars remain high. 400-500. Last night 535. This morning fasting 390. States " I can't hardly eat anything - it makes my sugar go up and I feel bad." Wants recommendation from PCP. Please advise pt.  Answer Assessment - Initial Assessment Questions 1. BLOOD GLUCOSE: "What is your blood glucose level?"      390 2. ONSET: "When did you check the blood glucose?"     Before breakfast 3. USUAL RANGE: "What is your glucose level usually?" (e.g., usual fasting morning value, usual evening value)     High 4. KETONES: "Do you check for ketones (urine or blood test strips)?" If yes, ask: "What does the test show now?"      No 5. TYPE 1 or 2:  "Do you know what type of diabetes you have?"  (e.g., Type 1, Type 2, Gestational; doesn't know)      Type 2  6. INSULIN: "Do you take insulin?" "What type of insulin(s) do you use? What is the mode of delivery? (syringe, pen; injection or pump)?"      No 7. DIABETES PILLS: "Do you take any pills for your diabetes?" If yes, ask: "Have you missed taking any pills recently?"     No 8. OTHER SYMPTOMS: "Do you have any symptoms?" (e.g., fever, frequent urination, difficulty breathing, dizziness, weakness, vomiting)     Feels bad, tired 9. PREGNANCY: "Is there any chance you are pregnant?" "When was your last menstrual period?"     n/a  Protocols used: DIABETES - HIGH BLOOD SUGAR-A-AH

## 2020-09-05 NOTE — Telephone Encounter (Signed)
The sample insulin for [name] was switched to [place] because we didn't have any samples of the [place]. Same instructions given to the patient 5 units a day for first 3 days. He should use the insulin injection WITH a meal. Not before the meal. Once daily insulin injection.  If his sugar is still >200+ fasting in the morning when he checks it, he may go up to 10 units per day max.

## 2020-09-06 DIAGNOSIS — S0100XS Unspecified open wound of scalp, sequela: Secondary | ICD-10-CM | POA: Diagnosis not present

## 2020-09-06 DIAGNOSIS — Z09 Encounter for follow-up examination after completed treatment for conditions other than malignant neoplasm: Secondary | ICD-10-CM | POA: Diagnosis not present

## 2020-09-06 DIAGNOSIS — C4442 Squamous cell carcinoma of skin of scalp and neck: Secondary | ICD-10-CM | POA: Diagnosis not present

## 2020-09-06 NOTE — Telephone Encounter (Signed)
The pt came by the office today and I demonstrated to him how to use the pen. He administered 5 units of the Basaglar in the office.

## 2020-09-06 NOTE — Telephone Encounter (Signed)
Pt called saying he can not get the insulin pen to work.  He dose not know if it is defective or if he is not doing something right.  CB#  [phone]

## 2020-09-10 ENCOUNTER — Ambulatory Visit: Payer: Self-pay

## 2020-09-10 ENCOUNTER — Other Ambulatory Visit: Payer: Self-pay | Admitting: Family Medicine

## 2020-09-10 DIAGNOSIS — Z483 Aftercare following surgery for neoplasm: Secondary | ICD-10-CM | POA: Diagnosis not present

## 2020-09-10 DIAGNOSIS — G4730 Sleep apnea, unspecified: Secondary | ICD-10-CM | POA: Diagnosis not present

## 2020-09-10 DIAGNOSIS — E1169 Type 2 diabetes mellitus with other specified complication: Secondary | ICD-10-CM

## 2020-09-10 DIAGNOSIS — Z7984 Long term (current) use of oral hypoglycemic drugs: Secondary | ICD-10-CM | POA: Diagnosis not present

## 2020-09-10 DIAGNOSIS — J432 Centrilobular emphysema: Secondary | ICD-10-CM | POA: Diagnosis not present

## 2020-09-10 DIAGNOSIS — Z4801 Encounter for change or removal of surgical wound dressing: Secondary | ICD-10-CM | POA: Diagnosis not present

## 2020-09-10 DIAGNOSIS — Z794 Long term (current) use of insulin: Secondary | ICD-10-CM

## 2020-09-10 DIAGNOSIS — Z79899 Other long term (current) drug therapy: Secondary | ICD-10-CM | POA: Diagnosis not present

## 2020-09-10 DIAGNOSIS — Z87891 Personal history of nicotine dependence: Secondary | ICD-10-CM | POA: Diagnosis not present

## 2020-09-10 DIAGNOSIS — Z9181 History of falling: Secondary | ICD-10-CM | POA: Diagnosis not present

## 2020-09-10 DIAGNOSIS — E1136 Type 2 diabetes mellitus with diabetic cataract: Secondary | ICD-10-CM | POA: Diagnosis not present

## 2020-09-10 DIAGNOSIS — K219 Gastro-esophageal reflux disease without esophagitis: Secondary | ICD-10-CM | POA: Diagnosis not present

## 2020-09-10 DIAGNOSIS — I10 Essential (primary) hypertension: Secondary | ICD-10-CM | POA: Diagnosis not present

## 2020-09-10 DIAGNOSIS — C4442 Squamous cell carcinoma of skin of scalp and neck: Secondary | ICD-10-CM | POA: Diagnosis not present

## 2020-09-10 DIAGNOSIS — E785 Hyperlipidemia, unspecified: Secondary | ICD-10-CM | POA: Diagnosis not present

## 2020-09-10 MED ORDER — NOVOFINE PEN NEEDLE 32G X 6 MM MISC: 1 refills | Status: DC

## 2020-09-10 MED ORDER — NOVOFINE PEN NEEDLE 32G X 6 MM MISC
1 refills | Status: DC
Start: 2020-09-10 — End: 2020-09-10

## 2020-09-10 NOTE — Telephone Encounter (Signed)
Pt is out of pen needles for his insulin Glargine and his ozempic. Please send to [facility] on file.

## 2020-09-10 NOTE — Addendum Note (Signed)
Addended by: Olin Hauser on: 09/10/2020 02:27 PM   Modules accepted: Orders

## 2020-09-13 DIAGNOSIS — Z483 Aftercare following surgery for neoplasm: Secondary | ICD-10-CM | POA: Diagnosis not present

## 2020-09-13 DIAGNOSIS — Z87891 Personal history of nicotine dependence: Secondary | ICD-10-CM | POA: Diagnosis not present

## 2020-09-13 DIAGNOSIS — E785 Hyperlipidemia, unspecified: Secondary | ICD-10-CM | POA: Diagnosis not present

## 2020-09-13 DIAGNOSIS — I10 Essential (primary) hypertension: Secondary | ICD-10-CM | POA: Diagnosis not present

## 2020-09-13 DIAGNOSIS — Z79899 Other long term (current) drug therapy: Secondary | ICD-10-CM | POA: Diagnosis not present

## 2020-09-13 DIAGNOSIS — K219 Gastro-esophageal reflux disease without esophagitis: Secondary | ICD-10-CM | POA: Diagnosis not present

## 2020-09-13 DIAGNOSIS — Z9181 History of falling: Secondary | ICD-10-CM | POA: Diagnosis not present

## 2020-09-13 DIAGNOSIS — J432 Centrilobular emphysema: Secondary | ICD-10-CM | POA: Diagnosis not present

## 2020-09-13 DIAGNOSIS — E1136 Type 2 diabetes mellitus with diabetic cataract: Secondary | ICD-10-CM | POA: Diagnosis not present

## 2020-09-13 DIAGNOSIS — Z7984 Long term (current) use of oral hypoglycemic drugs: Secondary | ICD-10-CM | POA: Diagnosis not present

## 2020-09-13 DIAGNOSIS — C4442 Squamous cell carcinoma of skin of scalp and neck: Secondary | ICD-10-CM | POA: Diagnosis not present

## 2020-09-13 DIAGNOSIS — Z4801 Encounter for change or removal of surgical wound dressing: Secondary | ICD-10-CM | POA: Diagnosis not present

## 2020-09-13 DIAGNOSIS — G4730 Sleep apnea, unspecified: Secondary | ICD-10-CM | POA: Diagnosis not present

## 2020-09-17 DIAGNOSIS — E785 Hyperlipidemia, unspecified: Secondary | ICD-10-CM | POA: Diagnosis not present

## 2020-09-17 DIAGNOSIS — Z79899 Other long term (current) drug therapy: Secondary | ICD-10-CM | POA: Diagnosis not present

## 2020-09-17 DIAGNOSIS — C4442 Squamous cell carcinoma of skin of scalp and neck: Secondary | ICD-10-CM | POA: Diagnosis not present

## 2020-09-17 DIAGNOSIS — K219 Gastro-esophageal reflux disease without esophagitis: Secondary | ICD-10-CM | POA: Diagnosis not present

## 2020-09-17 DIAGNOSIS — J432 Centrilobular emphysema: Secondary | ICD-10-CM | POA: Diagnosis not present

## 2020-09-17 DIAGNOSIS — Z7984 Long term (current) use of oral hypoglycemic drugs: Secondary | ICD-10-CM | POA: Diagnosis not present

## 2020-09-17 DIAGNOSIS — Z87891 Personal history of nicotine dependence: Secondary | ICD-10-CM | POA: Diagnosis not present

## 2020-09-17 DIAGNOSIS — Z483 Aftercare following surgery for neoplasm: Secondary | ICD-10-CM | POA: Diagnosis not present

## 2020-09-17 DIAGNOSIS — I10 Essential (primary) hypertension: Secondary | ICD-10-CM | POA: Diagnosis not present

## 2020-09-17 DIAGNOSIS — E1136 Type 2 diabetes mellitus with diabetic cataract: Secondary | ICD-10-CM | POA: Diagnosis not present

## 2020-09-17 DIAGNOSIS — Z4801 Encounter for change or removal of surgical wound dressing: Secondary | ICD-10-CM | POA: Diagnosis not present

## 2020-09-17 DIAGNOSIS — G4730 Sleep apnea, unspecified: Secondary | ICD-10-CM | POA: Diagnosis not present

## 2020-09-17 DIAGNOSIS — Z9181 History of falling: Secondary | ICD-10-CM | POA: Diagnosis not present

## 2020-09-19 DIAGNOSIS — J449 Chronic obstructive pulmonary disease, unspecified: Secondary | ICD-10-CM | POA: Diagnosis not present

## 2020-09-19 DIAGNOSIS — Z885 Allergy status to narcotic agent status: Secondary | ICD-10-CM | POA: Diagnosis not present

## 2020-09-19 DIAGNOSIS — F17220 Nicotine dependence, chewing tobacco, uncomplicated: Secondary | ICD-10-CM | POA: Diagnosis not present

## 2020-09-19 DIAGNOSIS — E1165 Type 2 diabetes mellitus with hyperglycemia: Secondary | ICD-10-CM | POA: Diagnosis not present

## 2020-09-19 DIAGNOSIS — S0100XD Unspecified open wound of scalp, subsequent encounter: Secondary | ICD-10-CM | POA: Diagnosis not present

## 2020-09-19 DIAGNOSIS — S0100XA Unspecified open wound of scalp, initial encounter: Secondary | ICD-10-CM | POA: Diagnosis not present

## 2020-09-19 DIAGNOSIS — Z85828 Personal history of other malignant neoplasm of skin: Secondary | ICD-10-CM | POA: Diagnosis not present

## 2020-09-19 DIAGNOSIS — Z483 Aftercare following surgery for neoplasm: Secondary | ICD-10-CM | POA: Diagnosis not present

## 2020-09-19 DIAGNOSIS — H9190 Unspecified hearing loss, unspecified ear: Secondary | ICD-10-CM | POA: Diagnosis not present

## 2020-09-19 DIAGNOSIS — Z794 Long term (current) use of insulin: Secondary | ICD-10-CM | POA: Diagnosis not present

## 2020-09-19 DIAGNOSIS — K219 Gastro-esophageal reflux disease without esophagitis: Secondary | ICD-10-CM | POA: Diagnosis not present

## 2020-09-19 DIAGNOSIS — I10 Essential (primary) hypertension: Secondary | ICD-10-CM | POA: Diagnosis not present

## 2020-09-20 DIAGNOSIS — G4730 Sleep apnea, unspecified: Secondary | ICD-10-CM | POA: Diagnosis not present

## 2020-09-20 DIAGNOSIS — Z9181 History of falling: Secondary | ICD-10-CM | POA: Diagnosis not present

## 2020-09-20 DIAGNOSIS — K219 Gastro-esophageal reflux disease without esophagitis: Secondary | ICD-10-CM | POA: Diagnosis not present

## 2020-09-20 DIAGNOSIS — C4442 Squamous cell carcinoma of skin of scalp and neck: Secondary | ICD-10-CM | POA: Diagnosis not present

## 2020-09-20 DIAGNOSIS — Z7984 Long term (current) use of oral hypoglycemic drugs: Secondary | ICD-10-CM | POA: Diagnosis not present

## 2020-09-20 DIAGNOSIS — Z483 Aftercare following surgery for neoplasm: Secondary | ICD-10-CM | POA: Diagnosis not present

## 2020-09-20 DIAGNOSIS — I10 Essential (primary) hypertension: Secondary | ICD-10-CM | POA: Diagnosis not present

## 2020-09-20 DIAGNOSIS — Z4801 Encounter for change or removal of surgical wound dressing: Secondary | ICD-10-CM | POA: Diagnosis not present

## 2020-09-20 DIAGNOSIS — E1136 Type 2 diabetes mellitus with diabetic cataract: Secondary | ICD-10-CM | POA: Diagnosis not present

## 2020-09-20 DIAGNOSIS — Z87891 Personal history of nicotine dependence: Secondary | ICD-10-CM | POA: Diagnosis not present

## 2020-09-20 DIAGNOSIS — Z79899 Other long term (current) drug therapy: Secondary | ICD-10-CM | POA: Diagnosis not present

## 2020-09-20 DIAGNOSIS — J432 Centrilobular emphysema: Secondary | ICD-10-CM | POA: Diagnosis not present

## 2020-09-20 DIAGNOSIS — E785 Hyperlipidemia, unspecified: Secondary | ICD-10-CM | POA: Diagnosis not present

## 2020-09-23 HISTORY — PX: SKIN CANCER EXCISION: SHX779

## 2020-09-24 ENCOUNTER — Other Ambulatory Visit: Payer: Self-pay | Admitting: Family Medicine

## 2020-09-24 NOTE — Telephone Encounter (Signed)
Note to clinic-No order by current PCP.   Medication is on patient's current profile and is due for refill.  Next OV 10/16/20 with R.Baity

## 2020-09-24 NOTE — Telephone Encounter (Signed)
Copied from [place] [phone]. Topic: Quick Communication - Rx Refill/Question >> Sep 24, 2020  9:57 AM Yvette Rack wrote: Medication: Insulin Glargine (BASAGLAR KWIKPEN) 100 UNIT/ML  Has the patient contacted their pharmacy? yes - Pt received first pen in the office pharmacy does not have Rx  Preferred Pharmacy (with phone number or street name): [facility], [place]. Phone: [phone]   Fax: [phone]  Agent: Please be advised that RX refills may take up to 3 business days. We ask that you follow-up with your pharmacy.

## 2020-09-25 MED ORDER — BASAGLAR KWIKPEN 100 UNIT/ML ~~LOC~~ SOPN: 5.0000 [IU] | PEN_INJECTOR | Freq: Every day | SUBCUTANEOUS | 0 refills | Status: DC

## 2020-10-09 ENCOUNTER — Ambulatory Visit (INDEPENDENT_AMBULATORY_CARE_PROVIDER_SITE_OTHER): Payer: Medicare Other

## 2020-10-09 VITALS — Ht 66.0 in | Wt 155.0 lb

## 2020-10-09 DIAGNOSIS — Z0000 Encounter for general adult medical examination without abnormal findings: Secondary | ICD-10-CM | POA: Diagnosis not present

## 2020-10-09 NOTE — Patient Instructions (Signed)
[name] , Thank you for taking time to come for your Medicare Wellness Visit. I appreciate your ongoing commitment to your health goals. Please review the following plan we discussed and let me know if I can assist you in the future.   Screening recommendations/referrals: Colonoscopy: completed 10/20/2019 Recommended yearly ophthalmology/optometry visit for glaucoma screening and checkup Recommended yearly dental visit for hygiene and checkup  Vaccinations: Influenza vaccine: completed 02/16/2020, due 12/24/2020 Pneumococcal vaccine: completed 05/03/2020 Tdap vaccine: completed 10/08/2011 Shingles vaccine: discussed   Covid-19:  04/26/2020, 08/24/2019, 07/22/2019  Advanced directives: Advance directive discussed with you today.   Conditions/risks identified: none  Next appointment: Follow up in one year for your annual wellness visit.   Preventive Care 27 Years and Older, Male Preventive care refers to lifestyle choices and visits with your health care provider that can promote health and wellness. What does preventive care include?  A yearly physical exam. This is also called an annual well check.  Dental exams once or twice a year.  Routine eye exams. Ask your health care provider how often you should have your eyes checked.  Personal lifestyle choices, including:  Daily care of your teeth and gums.  Regular physical activity.  Eating a healthy diet.  Avoiding tobacco and drug use.  Limiting alcohol use.  Practicing safe sex.  Taking low doses of aspirin every day.  Taking vitamin and mineral supplements as recommended by your health care provider. What happens during an annual well check? The services and screenings done by your health care provider during your annual well check will depend on your age, overall health, lifestyle risk factors, and family history of disease. Counseling  Your health care provider may ask you questions about your:  Alcohol use.  Tobacco  use.  Drug use.  Emotional well-being.  Home and relationship well-being.  Sexual activity.  Eating habits.  History of falls.  Memory and ability to understand (cognition).  Work and work Statistician. Screening  You may have the following tests or measurements:  Height, weight, and BMI.  Blood pressure.  Lipid and cholesterol levels. These may be checked every 5 years, or more frequently if you are over 43 years old.  Skin check.  Lung cancer screening. You may have this screening every year starting at age 75 if you have a 30-pack-year history of smoking and currently smoke or have quit within the past 15 years.  Fecal occult blood test (FOBT) of the stool. You may have this test every year starting at age 85.  Flexible sigmoidoscopy or colonoscopy. You may have a sigmoidoscopy every 5 years or a colonoscopy every 10 years starting at age 68.  Prostate cancer screening. Recommendations will vary depending on your family history and other risks.  Hepatitis C blood test.  Hepatitis B blood test.  Sexually transmitted disease (STD) testing.  Diabetes screening. This is done by checking your blood sugar (glucose) after you have not eaten for a while (fasting). You may have this done every 1-3 years.  Abdominal aortic aneurysm (AAA) screening. You may need this if you are a current or former smoker.  Osteoporosis. You may be screened starting at age 63 if you are at high risk. Talk with your health care provider about your test results, treatment options, and if necessary, the need for more tests. Vaccines  Your health care provider may recommend certain vaccines, such as:  Influenza vaccine. This is recommended every year.  Tetanus, diphtheria, and acellular pertussis (Tdap, Td) vaccine.  You may need a Td booster every 10 years.  Zoster vaccine. You may need this after age 29.  Pneumococcal 13-valent conjugate (PCV13) vaccine. One dose is recommended after age  37.  Pneumococcal polysaccharide (PPSV23) vaccine. One dose is recommended after age 102. Talk to your health care provider about which screenings and vaccines you need and how often you need them. This information is not intended to replace advice given to you by your health care provider. Make sure you discuss any questions you have with your health care provider. Document Released: 06/08/2015 Document Revised: 01/30/2016 Document Reviewed: 03/13/2015 Elsevier Interactive Patient Education  2017 [place] Prevention in the Home Falls can cause injuries. They can happen to people of all ages. There are many things you can do to make your home safe and to help prevent falls. What can I do on the outside of my home?  Regularly fix the edges of walkways and driveways and fix any cracks.  Remove anything that might make you trip as you walk through a door, such as a raised step or threshold.  Trim any bushes or trees on the path to your home.  Use bright outdoor lighting.  Clear any walking paths of anything that might make someone trip, such as rocks or tools.  Regularly check to see if handrails are loose or broken. Make sure that both sides of any steps have handrails.  Any raised decks and porches should have guardrails on the edges.  Have any leaves, snow, or ice cleared regularly.  Use sand or salt on walking paths during winter.  Clean up any spills in your garage right away. This includes oil or grease spills. What can I do in the bathroom?  Use night lights.  Install grab bars by the toilet and in the tub and shower. Do not use towel bars as grab bars.  Use non-skid mats or decals in the tub or shower.  If you need to sit down in the shower, use a plastic, non-slip stool.  Keep the floor dry. Clean up any water that spills on the floor as soon as it happens.  Remove soap buildup in the tub or shower regularly.  Attach bath mats securely with double-sided  non-slip rug tape.  Do not have throw rugs and other things on the floor that can make you trip. What can I do in the bedroom?  Use night lights.  Make sure that you have a light by your bed that is easy to reach.  Do not use any sheets or blankets that are too big for your bed. They should not hang down onto the floor.  Have a firm chair that has side arms. You can use this for support while you get dressed.  Do not have throw rugs and other things on the floor that can make you trip. What can I do in the kitchen?  Clean up any spills right away.  Avoid walking on wet floors.  Keep items that you use a lot in easy-to-reach places.  If you need to reach something above you, use a strong step stool that has a grab bar.  Keep electrical cords out of the way.  Do not use floor polish or wax that makes floors slippery. If you must use wax, use non-skid floor wax.  Do not have throw rugs and other things on the floor that can make you trip. What can I do with my stairs?  Do not leave any  items on the stairs.  Make sure that there are handrails on both sides of the stairs and use them. Fix handrails that are broken or loose. Make sure that handrails are as long as the stairways.  Check any carpeting to make sure that it is firmly attached to the stairs. Fix any carpet that is loose or worn.  Avoid having throw rugs at the top or bottom of the stairs. If you do have throw rugs, attach them to the floor with carpet tape.  Make sure that you have a light switch at the top of the stairs and the bottom of the stairs. If you do not have them, ask someone to add them for you. What else can I do to help prevent falls?  Wear shoes that:  Do not have high heels.  Have rubber bottoms.  Are comfortable and fit you well.  Are closed at the toe. Do not wear sandals.  If you use a stepladder:  Make sure that it is fully opened. Do not climb a closed stepladder.  Make sure that both  sides of the stepladder are locked into place.  Ask someone to hold it for you, if possible.  Clearly mark and make sure that you can see:  Any grab bars or handrails.  First and last steps.  Where the edge of each step is.  Use tools that help you move around (mobility aids) if they are needed. These include:  Canes.  Walkers.  Scooters.  Crutches.  Turn on the lights when you go into a dark area. Replace any light bulbs as soon as they burn out.  Set up your furniture so you have a clear path. Avoid moving your furniture around.  If any of your floors are uneven, fix them.  If there are any pets around you, be aware of where they are.  Review your medicines with your doctor. Some medicines can make you feel dizzy. This can increase your chance of falling. Ask your doctor what other things that you can do to help prevent falls. This information is not intended to replace advice given to you by your health care provider. Make sure you discuss any questions you have with your health care provider. Document Released: 03/08/2009 Document Revised: 10/18/2015 Document Reviewed: 06/16/2014 Elsevier Interactive Patient Education  2017 [employer].

## 2020-10-09 NOTE — Progress Notes (Signed)
I connected with [name] today by telephone and verified that I am speaking with the correct person using two identifiers. Location patient: home Location provider: work Persons participating in the virtual visit: Kaeson, Kleinert LPN.   I discussed the limitations, risks, security and privacy concerns of performing an evaluation and management service by telephone and the availability of in person appointments. I also discussed with the patient that there may be a patient responsible charge related to this service. The patient expressed understanding and verbally consented to this telephonic visit.    Interactive audio and video telecommunications were attempted between this provider and patient, however failed, due to patient having technical difficulties OR patient did not have access to video capability.  We continued and completed visit with audio only.     Vital signs may be patient reported or missing.  Subjective:   [name] is a 65 y.o. male who presents for Medicare Annual/Subsequent preventive examination.  Review of Systems     Cardiac Risk Factors include: advanced age (>84mn, >>63women);diabetes mellitus;dyslipidemia;hypertension;male gender;sedentary lifestyle     Objective:    Today's Vitals   10/09/20 1031  Weight: 155 lb (70.3 kg)  Height: '5\' 6"'  (1.676 m)   Body mass index is 25.02 kg/m.  Advanced Directives 10/09/2020 08/27/2020 10/20/2019 10/19/2019  Does Patient Have a Medical Advance Directive? No No No No  Would patient like information on creating a medical advance directive? - - No - Patient declined -    Current Medications (verified) Outpatient Encounter Medications as of 10/09/2020  Medication Sig  . Accu-Chek FastClix Lancets MISC Use to check blood sugar up to twice a day  . ACCU-CHEK GUIDE test strip Use to check blood sugar up to twice a day  . albuterol (PROVENTIL) (2.5 MG/3ML) 0.083% nebulizer solution Take 3 mLs (2.5  mg total) by nebulization every 6 (six) hours as needed for wheezing or shortness of breath.  .[name] Kitchenatorvastatin (LIPITOR) 40 MG tablet TAKE 1 TABLET BY MOUTH AT BEDTIME FOR HIGH CHOLESTEROL  . Blood Glucose Monitoring Suppl (ACCU-CHEK GUIDE ME) w/Device KIT Use to check blood sugar up to 2 times daily  . hydrochlorothiazide (MICROZIDE) 12.5 MG capsule TAKE ONE CAPSULE BY MOUTH ONCE DAILY FOR HIGH BLOOD PRESSURE  . Insulin Pen Needle (NOVOFINE PEN NEEDLE) 32G X 6 MM MISC Use with insulin injection once daily and Ozempic once weekly.  .[name] Kitchenlisinopril (ZESTRIL) 20 MG tablet Take 1 tablet (20 mg total) by mouth daily. for high blood pressure  . Melatonin 10 MG CAPS Take by mouth.  . metFORMIN (GLUCOPHAGE) 1000 MG tablet Take 1 tablet twice daily with food for Diabetes  . omeprazole (PRILOSEC) 20 MG capsule Take 1 capsule (20 mg total) by mouth daily.  .[name] KitchenOZEMPIC, 0.25 OR 0.5 MG/DOSE, 2 MG/1.5ML SOPN Inject 0.25 mg into the skin once a week. For first 4 weeks. Then increase dose to 0.515mweekly  . traZODone (DESYREL) 100 MG tablet Take 0.5-1 tablets (50-100 mg total) by mouth at bedtime as needed for sleep.  . Bismuth Tribromoph-Petrolatum (XEROFORM OIL EMULSION STRIP) MISC Apply directly to wound, change daily, use non adherent pad on top (Patient not taking: No sig reported)  . busPIRone (BUSPAR) 5 MG tablet Take 1 tablet (5 mg total) by mouth 3 (three) times daily. (Patient not taking: No sig reported)  . HYDROcodone-acetaminophen (NORCO/VICODIN) 5-325 MG tablet Take by mouth. (Patient not taking: No sig reported)  . Insulin Glargine (BASAGLAR KWIKPEN) 100 UNIT/ML  Inject 5 Units into the skin daily. (Patient not taking: Reported on 10/09/2020)  . Iron, Ferrous Sulfate, 325 (65 Fe) MG TABS Take 1 tablet by mouth daily.  . Multiple Vitamins-Minerals (VITAMIN D3 COMPLETE PO) Take by mouth.  (Patient not taking: No sig reported)  . oxyCODONE (OXY IR/ROXICODONE) 5 MG immediate release tablet Take 5 mg by mouth  every 6 (six) hours as needed. (Patient not taking: Reported on 10/09/2020)   No facility-administered encounter medications on file as of 10/09/2020.    Allergies (verified) Codeine   History: Past Medical History:  Diagnosis Date  . Anxiety   . COPD (chronic obstructive pulmonary disease) ([place])   . Diabetes mellitus without complication ([place])   . GERD (gastroesophageal reflux disease)   . Hypertension   . Insomnia   . Personal history of tobacco use, presenting hazards to health 05/31/2015   Past Surgical History:  Procedure Laterality Date  . COLONOSCOPY WITH PROPOFOL N/A 10/20/2019   Procedure: COLONOSCOPY WITH PROPOFOL;  Surgeon: Jonathon Bellows, MD;  Location: [facility] ENDOSCOPY;  Service: Gastroenterology;  Laterality: N/A;  . HERNIA REPAIR Left 2003  . SKIN CANCER EXCISION  09/23/2020   had skin graft removed    Family History  Problem Relation Age of Onset  . Renal Disease Mother 68  . Heart disease Father   . Stroke Father   . Alzheimer's disease Father   . Stroke Brother   . Diabetes Brother   . Diabetes Maternal Grandfather    Social History   Socioeconomic History  . Marital status: Divorced    Spouse name: Not on file  . Number of children: 3  . Years of education: Not on file  . Highest education level: 10th grade  Occupational History  . Occupation: retired  Tobacco Use  . Smoking status: Former Smoker    Packs/day: 1.00    Years: 40.00    Pack years: 40.00    Types: Cigarettes    Quit date: 07/25/2018    Years since quitting: 2.2  . Smokeless tobacco: Former Network engineer  . Vaping Use: Never used  Substance and Sexual Activity  . Alcohol use: Not Currently    Comment: quit 2003, drank about 5 beer per week prior  . Drug use: Never  . Sexual activity: Yes    Birth control/protection: None  Other Topics Concern  . Not on file  Social History Narrative  . Not on file   Social Determinants of Health   Financial Resource Strain: Low Risk   .  Difficulty of Paying Living Expenses: Not hard at all  Food Insecurity: No Food Insecurity  . Worried About Charity fundraiser in the Last Year: Never true  . Ran Out of Food in the Last Year: Never true  Transportation Needs: No Transportation Needs  . Lack of Transportation (Medical): No  . Lack of Transportation (Non-Medical): No  Physical Activity: Inactive  . Days of Exercise per Week: 0 days  . Minutes of Exercise per Session: 0 min  Stress: No Stress Concern Present  . Feeling of Stress : Not at all  Social Connections: Not on file    Tobacco Counseling Counseling given: Not Answered   Clinical Intake:  Pre-visit preparation completed: Yes  Pain : No/denies pain     Nutritional Status: BMI 25 -29 Overweight Nutritional Risks: None Diabetes: Yes  How often do you need to have someone help you when you read instructions, pamphlets, or other written materials from  your doctor or pharmacy?: 1 - Never What is the last grade level you completed in school?: 10th grade  Diabetic? Yes Nutrition Risk Assessment:  Has the patient had any N/V/D within the last 2 months?  No  Does the patient have any non-healing wounds?  No  Has the patient had any unintentional weight loss or weight gain?  Yes   Diabetes:  Is the patient diabetic?  Yes  If diabetic, was a CBG obtained today?  No  Did the patient bring in their glucometer from home?  No  How often do you monitor your CBG's? daily.   Financial Strains and Diabetes Management:  Are you having any financial strains with the device, your supplies or your medication? No .  Does the patient want to be seen by Chronic Care Management for management of their diabetes?  No  Would the patient like to be referred to a Nutritionist or for Diabetic Management?  No   Diabetic Exams:  Diabetic Eye Exam: Completed 02/22/2020 Diabetic Foot Exam: Overdue, Pt has been advised about the importance in completing this exam. Pt is  scheduled for diabetic foot exam on next appointment.   Interpreter Needed?: No  Information entered by :: NAllen LPN   Activities of Daily Living In your present state of health, do you have any difficulty performing the following activities: 10/09/2020  Hearing? Y  Vision? N  Difficulty concentrating or making decisions? N  Walking or climbing stairs? Y  Dressing or bathing? N  Doing errands, shopping? Y  Comment someone takes him  Conservation officer, nature and eating ? N  Using the Toilet? N  In the past six months, have you accidently leaked urine? N  Do you have problems with loss of bowel control? N  Managing your Medications? N  Managing your Finances? N  Housekeeping or managing your Housekeeping? N  Some recent data might be hidden    Patient Care Team: Malfi, Lupita Raider, FNP as PCP - General (Family Medicine)  Indicate any recent Medical Services you may have received from other than [facility] providers in the past year (date may be approximate).     Assessment:   This is a routine wellness examination for [place].  Hearing/Vision screen  Hearing Screening   '125Hz'  '250Hz'  '500Hz'  '1000Hz'  '2000Hz'  '3000Hz'  '4000Hz'  '6000Hz'  '8000Hz'   Right ear:           Left ear:           Vision Screening Comments: Regular eye exams, [employer]  Dietary issues and exercise activities discussed: Current Exercise Habits: The patient does not participate in regular exercise at present  Goals Addressed            This Visit's Progress   . Patient Stated       10/09/2020, no goals      Depression Screen PHQ 2/9 Scores 10/09/2020 08/01/2019 02/10/2019 11/08/2018 07/27/2018  PHQ - 2 Score 0 '4 1 2 ' 0  PHQ- 9 Score - '12 8 7 ' -    Fall Risk Fall Risk  10/09/2020 08/01/2019  Falls in the past year? 0 0  Number falls in past yr: - 0  Risk for fall due to : Medication side effect;Impaired balance/gait No Fall Risks  Follow up Falls evaluation completed;Education provided;Falls prevention discussed -     FALL RISK PREVENTION PERTAINING TO THE HOME:  Any stairs in or around the home? No  If so, are there any without handrails? n/a Home free of loose  throw rugs in walkways, pet beds, electrical cords, etc? Yes  Adequate lighting in your home to reduce risk of falls? Yes   ASSISTIVE DEVICES UTILIZED TO PREVENT FALLS:  Life alert? No  Use of a cane, walker or w/c? Yes  Grab bars in the bathroom? Yes  Shower chair or bench in shower? No  Elevated toilet seat or a handicapped toilet? No   TIMED UP AND GO:  Was the test performed? No .   Cognitive Function:     6CIT Screen 10/09/2020  What Year? 0 points  What month? 0 points  What time? 0 points  Count back from 20 0 points  Months in reverse 4 points  Repeat phrase 8 points  Total Score 12    Immunizations Immunization History  Administered Date(s) Administered  . Fluad Quad(high Dose 65+) 02/10/2019, 02/16/2020  . Influenza,inj,Quad PF,6+ Mos 02/15/2018  . Moderna Sars-Covid-2 Vaccination 04/26/2020  . PFIZER(Purple Top)SARS-COV-2 Vaccination 07/22/2019, 08/24/2019  . Pneumococcal Conjugate-13 05/03/2020  . Pneumococcal Polysaccharide-23 01/28/2011  . Tdap 10/08/2011    TDAP status: Up to date  Flu Vaccine status: Up to date  Pneumococcal vaccine status: Up to date  Covid-19 vaccine status: Completed vaccines  Qualifies for Shingles Vaccine? Yes   Zostavax completed No   Shingrix Completed?: No.    Education has been provided regarding the importance of this vaccine. Patient has been advised to call insurance company to determine out of pocket expense if they have not yet received this vaccine. Advised may also receive vaccine at local pharmacy or Health Dept. Verbalized acceptance and understanding.  Screening Tests Health Maintenance  Topic Date Due  . COVID-19 Vaccine (4 - Booster) 07/25/2020  . FOOT EXAM  08/29/2020  . INFLUENZA VACCINE  12/24/2020  . OPHTHALMOLOGY EXAM  02/21/2021  . HEMOGLOBIN A1C   02/26/2021  . PNA vac Low Risk Adult (2 of 2 - PPSV23) 05/03/2021  . TETANUS/TDAP  10/07/2021  . COLONOSCOPY (Pts 45-67yr Insurance coverage will need to be confirmed)  10/19/2029  . Hepatitis C Screening  Completed  . HPV VACCINES  Aged Out    Health Maintenance  Health Maintenance Due  Topic Date Due  . COVID-19 Vaccine (4 - Booster) 07/25/2020  . FOOT EXAM  08/29/2020    Colorectal cancer screening: Type of screening: Colonoscopy. Completed 10/20/2019. Repeat every 10 years  Lung Cancer Screening: (Low Dose CT Chest recommended if Age 67-80years, 30 pack-year currently smoking OR have quit w/in 15years.) does qualify.   Lung Cancer Screening Referral: CT scan 10/12/2019  Additional Screening:  Hepatitis C Screening: does qualify; Completed 06/08/2014  Vision Screening: Recommended annual ophthalmology exams for early detection of glaucoma and other disorders of the eye. Is the patient up to date with their annual eye exam?  Yes  Who is the provider or what is the name of the [facility] which the patient [facility] exams[facility] provider, would they like to be referred to a provider to establish care? No .   Dental Screening: Recommended annual dental exams for proper oral hygiene  Community Resource Referral / Chronic Care Management: CRR required this visit?  No   CCM required this visit?  No      Plan:     I have personally reviewed and noted the following in the patient's chart:   . Medical and social history . Use of alcohol, tobacco or illicit drugs  . Current medications  and supplements including opioid prescriptions. Patient is not currently taking opioid prescriptions. . Functional ability and status . Nutritional status . Physical activity . Advanced directives . List of other physicians . Hospitalizations, surgeries, and ER visits in previous 12 months . Vitals . Screenings to include cognitive,  depression, and falls . Referrals and appointments  In addition, I have reviewed and discussed with patient certain preventive protocols, quality metrics, and best practice recommendations. A written personalized care plan for preventive services as well as general preventive health recommendations were provided to patient.     Kellie Simmering, LPN   1/66/0600   Nurse Notes:

## 2020-10-10 ENCOUNTER — Telehealth: Payer: Self-pay

## 2020-10-10 NOTE — Telephone Encounter (Signed)
Left message for patient to notify them that it is time to schedule annual low dose lung cancer screening CT scan. Instructed patient to call back ([phone]) to verify information and schedule.  

## 2020-10-16 ENCOUNTER — Other Ambulatory Visit: Payer: Self-pay

## 2020-10-16 ENCOUNTER — Ambulatory Visit (INDEPENDENT_AMBULATORY_CARE_PROVIDER_SITE_OTHER): Payer: Medicare Other | Admitting: Internal Medicine

## 2020-10-16 ENCOUNTER — Encounter: Payer: Self-pay | Admitting: Internal Medicine

## 2020-10-16 VITALS — BP 97/64 | HR 73 | Ht 66.0 in | Wt 148.8 lb

## 2020-10-16 DIAGNOSIS — K219 Gastro-esophageal reflux disease without esophagitis: Secondary | ICD-10-CM | POA: Diagnosis not present

## 2020-10-16 DIAGNOSIS — E1169 Type 2 diabetes mellitus with other specified complication: Secondary | ICD-10-CM | POA: Diagnosis not present

## 2020-10-16 DIAGNOSIS — F5101 Primary insomnia: Secondary | ICD-10-CM

## 2020-10-16 DIAGNOSIS — G4733 Obstructive sleep apnea (adult) (pediatric): Secondary | ICD-10-CM

## 2020-10-16 DIAGNOSIS — D508 Other iron deficiency anemias: Secondary | ICD-10-CM

## 2020-10-16 DIAGNOSIS — E785 Hyperlipidemia, unspecified: Secondary | ICD-10-CM | POA: Diagnosis not present

## 2020-10-16 DIAGNOSIS — F419 Anxiety disorder, unspecified: Secondary | ICD-10-CM

## 2020-10-16 DIAGNOSIS — I10 Essential (primary) hypertension: Secondary | ICD-10-CM

## 2020-10-16 DIAGNOSIS — J410 Simple chronic bronchitis: Secondary | ICD-10-CM

## 2020-10-16 DIAGNOSIS — B182 Chronic viral hepatitis C: Secondary | ICD-10-CM

## 2020-10-16 MED ORDER — TRAZODONE HCL 100 MG PO TABS: 50.0000 mg | ORAL_TABLET | Freq: Every evening | ORAL | 0 refills | Status: DC | PRN

## 2020-10-16 MED ORDER — OMEPRAZOLE 20 MG PO CPDR: 20.0000 mg | DELAYED_RELEASE_CAPSULE | Freq: Every day | ORAL | 0 refills | Status: DC

## 2020-10-16 MED ORDER — ATORVASTATIN CALCIUM 40 MG PO TABS: ORAL_TABLET | ORAL | 0 refills | Status: DC

## 2020-10-16 MED ORDER — OZEMPIC (0.25 OR 0.5 MG/DOSE) 2 MG/1.5ML ~~LOC~~ SOPN
0.2500 mg | PEN_INJECTOR | SUBCUTANEOUS | 0 refills | Status: DC
Start: 2020-10-16 — End: 2021-01-21

## 2020-10-16 MED ORDER — LISINOPRIL 20 MG PO TABS: 20.0000 mg | ORAL_TABLET | Freq: Every day | ORAL | 0 refills | Status: DC

## 2020-10-16 MED ORDER — METFORMIN HCL 1000 MG PO TABS: ORAL_TABLET | ORAL | 0 refills | Status: DC

## 2020-10-16 MED ORDER — HYDROCHLOROTHIAZIDE 12.5 MG PO CAPS: ORAL_CAPSULE | ORAL | 0 refills | Status: DC

## 2020-10-16 MED ORDER — BASAGLAR KWIKPEN 100 UNIT/ML ~~LOC~~ SOPN: 5.0000 [IU] | PEN_INJECTOR | Freq: Every day | SUBCUTANEOUS | 0 refills | Status: DC

## 2020-10-16 NOTE — Progress Notes (Signed)
Subjective:    Patient ID: [name], male    DOB: 14-Apr-1956, 65 y.o.   MRN: [id number]  HPI  Patient presents the clinic today for follow-up of chronic conditions.  He is establishing care with me today, transferring care from Lonie Peak, NP.  HTN: His BP today is 97/64.  He is taking Lisinopril and HCTZ as prescribed.  He denies lightheadedness.  ECG from 10/2011 reviewed.  HLD: His last LDL was 45, triglycerides 233, 01/2020.  He denies myalgias on Atorvastatin.  He tries to consume a low-fat diet.  COPD: He denies chronic cough or shortness of breath.  He uses Albuterol nebulizers as needed.  There are no PFTs on file. He does not smoke.  OSA: He averages 5-6 hours of sleep per night without the use of his CPAP.  Sleep study from 11/2019 reviewed.  GERD: He is not sure what triggers this.  He denies breakthrough on Omeprazole.  Upper GI from 09/2019 reviewed.  DM 2: His last A1c was 10.4%, 08/2020.  He is taking Metformin, Ozempic and Basaglar as prescribed.  His sugars range 114-500.  He does not check his feet routinely.  His last eye exam was 11/2019.  Flu 01/2019:  Pneumovax 01/2011.  Prevnar 04/2020.  [place] x3.  Anxiety: Persistent, he is not taking Buspirone because it was effective.  He is not currently seeing a therapist.  He denies depression, SI/HI.  Insomnia: He has difficulty staying asleep.  He is taking Trazodone as prescribed with good relief of symptoms.  Sleep study from 11/2019 reviewed.  Chronic Hep C: In remission s/p treatment many years ago.  Iron Deficiency Anemia: His last H/H was 9/28.6, 08/2020.  He is taking an oral Iron supplement daily.  He does not follow with hematology.  Review of Systems      Past Medical History:  Diagnosis Date  . Anxiety   . COPD (chronic obstructive pulmonary disease) ([place])   . Diabetes mellitus without complication ([place])   . GERD (gastroesophageal reflux disease)   . Hypertension   . Insomnia   . Personal  history of tobacco use, presenting hazards to health 05/31/2015    Current Outpatient Medications  Medication Sig Dispense Refill  . Accu-Chek FastClix Lancets MISC Use to check blood sugar up to twice a day 204 each 3  . ACCU-CHEK GUIDE test strip Use to check blood sugar up to twice a day 200 each 3  . albuterol (PROVENTIL) (2.5 MG/3ML) 0.083% nebulizer solution Take 3 mLs (2.5 mg total) by nebulization every 6 (six) hours as needed for wheezing or shortness of breath. 75 mL 1  . atorvastatin (LIPITOR) 40 MG tablet TAKE 1 TABLET BY MOUTH AT BEDTIME FOR HIGH CHOLESTEROL 90 tablet 1  . Bismuth Tribromoph-Petrolatum (XEROFORM OIL EMULSION STRIP) MISC Apply directly to wound, change daily, use non adherent pad on top (Patient not taking: No sig reported) 50 each 0  . Blood Glucose Monitoring Suppl (ACCU-CHEK GUIDE ME) w/Device KIT Use to check blood sugar up to 2 times daily 1 kit 0  . busPIRone (BUSPAR) 5 MG tablet Take 1 tablet (5 mg total) by mouth 3 (three) times daily. (Patient not taking: No sig reported) 90 tablet 1  . hydrochlorothiazide (MICROZIDE) 12.5 MG capsule TAKE ONE CAPSULE BY MOUTH ONCE DAILY FOR HIGH BLOOD PRESSURE 90 capsule 1  . HYDROcodone-acetaminophen (NORCO/VICODIN) 5-325 MG tablet Take by mouth. (Patient not taking: No sig reported)    . Insulin Glargine [facility])  100 UNIT/ML Inject 5 Units into the skin daily. (Patient not taking: Reported on 10/09/2020) 3 mL 0  . Insulin Pen Needle (NOVOFINE PEN NEEDLE) 32G X 6 MM MISC Use with insulin injection once daily and Ozempic once weekly. 90 each 1  . Iron, Ferrous Sulfate, 325 (65 Fe) MG TABS Take 1 tablet by mouth daily. 30 tablet 0  . lisinopril (ZESTRIL) 20 MG tablet Take 1 tablet (20 mg total) by mouth daily. for high blood pressure 90 tablet 1  . Melatonin 10 MG CAPS Take by mouth.    . metFORMIN (GLUCOPHAGE) 1000 MG tablet Take 1 tablet twice daily with food for Diabetes 180 tablet 1  . Multiple Vitamins-Minerals  (VITAMIN D3 COMPLETE PO) Take by mouth.  (Patient not taking: No sig reported)    . omeprazole (PRILOSEC) 20 MG capsule Take 1 capsule (20 mg total) by mouth daily. 90 capsule 1  . oxyCODONE (OXY IR/ROXICODONE) 5 MG immediate release tablet Take 5 mg by mouth every 6 (six) hours as needed. (Patient not taking: Reported on 10/09/2020)    . OZEMPIC, 0.25 OR 0.5 MG/DOSE, 2 MG/1.5ML SOPN Inject 0.25 mg into the skin once a week. For first 4 weeks. Then increase dose to 0.34m weekly 1.5 mL 0  . traZODone (DESYREL) 100 MG tablet Take 0.5-1 tablets (50-100 mg total) by mouth at bedtime as needed for sleep. 90 tablet 1   No current facility-administered medications for this visit.    Allergies  Allergen Reactions  . Codeine     Other reaction(s): Vomiting    Family History  Problem Relation Age of Onset  . Renal Disease Mother 54 . Heart disease Father   . Stroke Father   . Alzheimer's disease Father   . Stroke Brother   . Diabetes Brother   . Diabetes Maternal Grandfather     Social History   Socioeconomic History  . Marital status: Divorced    Spouse name: Not on file  . Number of children: 3  . Years of education: Not on file  . Highest education level: 10th grade  Occupational History  . Occupation: retired  Tobacco Use  . Smoking status: Former Smoker    Packs/day: 1.00    Years: 40.00    Pack years: 40.00    Types: Cigarettes    Quit date: 07/25/2018    Years since quitting: 2.2  . Smokeless tobacco: Former UNetwork engineer . Vaping Use: Never used  Substance and Sexual Activity  . Alcohol use: Not Currently    Comment: quit 2003, drank about 5 beer per week prior  . Drug use: Never  . Sexual activity: Yes    Birth control/protection: None  Other Topics Concern  . Not on file  Social History Narrative  . Not on file   Social Determinants of Health   Financial Resource Strain: Low Risk   . Difficulty of Paying Living Expenses: Not hard at all  Food  Insecurity: No Food Insecurity  . Worried About RCharity fundraiserin the Last Year: Never true  . Ran Out of Food in the Last Year: Never true  Transportation Needs: No Transportation Needs  . Lack of Transportation (Medical): No  . Lack of Transportation (Non-Medical): No  Physical Activity: Inactive  . Days of Exercise per Week: 0 days  . Minutes of Exercise per Session: 0 min  Stress: No Stress Concern Present  . Feeling of Stress : Not at all  Social  Connections: Not on file  Intimate Partner Violence: Not on file     Constitutional: Denies fever, malaise, fatigue, headache or abrupt weight changes.  HEENT: Denies eye pain, eye redness, ear pain, ringing in the ears, wax buildup, runny nose, nasal congestion, bloody nose, or sore throat. Respiratory: Denies difficulty breathing, shortness of breath, cough or sputum production.   Cardiovascular: Denies chest pain, chest tightness, palpitations or swelling in the hands or feet.  Gastrointestinal: Denies abdominal pain, bloating, constipation, diarrhea or blood in the stool.  GU: Denies urgency, frequency, pain with urination, burning sensation, blood in urine, odor or discharge. Musculoskeletal: Denies decrease in range of motion, difficulty with gait, muscle pain or joint pain and swelling.  Skin: Denies redness, rashes, lesions or ulcercations.  Neurological: Patient reports insomnia.  Denies dizziness, difficulty with memory, difficulty with speech or problems with balance and coordination.  Psych: Pt has a history of anxiety. Denies depression, SI/HI.  No other specific complaints in a complete review of systems (except as listed in HPI above).  Objective:   Physical Exam  BP 97/64   Pulse 73   Ht 5' 6" (1.676 m)   Wt 148 lb 12.8 oz (67.5 kg)   SpO2 99%   BMI 24.02 kg/m   Wt Readings from Last 3 Encounters:  10/09/20 155 lb (70.3 kg)  08/31/20 150 lb 3.2 oz (68.1 kg)  08/27/20 143 lb (64.9 kg)    General: Appears  his stated age, chronically ill-appearing, in NAD. Skin: Warm, dry and intact.  HEENT: Head: normal shape and size; Eyes: sclera white and EOMs intact;  Cardiovascular: Normal rate and rhythm. S1,S2 noted.  No murmur, rubs or gallops noted. No JVD or BLE edema. No carotid bruits noted. Pulmonary/Chest: Normal effort and diminished breath sounds. No respiratory distress. No wheezes, rales or ronchi noted.  Abdomen: Soft and nontender. Normal bowel sounds.  Musculoskeletal:  No difficulty with gait.  Neurological: Alert and oriented.  Psychiatric: Mood and affect normal.  Mildly anxious appearing. Judgment and thought content normal.    BMET    Component Value Date/Time   NA 130 (L) 08/27/2020 1029   NA 137 02/05/2013 2355   K 4.3 08/27/2020 1029   K 3.8 02/05/2013 2355   CL 99 08/27/2020 1029   CL 107 02/05/2013 2355   CO2 21 (L) 08/27/2020 1029   CO2 23 02/05/2013 2355   GLUCOSE 444 (H) 08/27/2020 1029   GLUCOSE 113 (H) 02/05/2013 2355   BUN 32 (H) 08/27/2020 1029   BUN 9 02/05/2013 2355   CREATININE 1.60 (H) 08/27/2020 1029   CREATININE 1.23 02/16/2020 0922   CALCIUM 8.0 (L) 08/27/2020 1029   CALCIUM 8.9 02/05/2013 2355   GFRNONAA 47 (L) 08/27/2020 1029   GFRNONAA 61 02/16/2020 0922   GFRAA 70 02/16/2020 0922    Lipid Panel     Component Value Date/Time   CHOL 109 02/16/2020 0922   TRIG 233 (H) 02/16/2020 0922   HDL 35 (L) 02/16/2020 0922   CHOLHDL 3.1 02/16/2020 0922   LDLCALC 45 02/16/2020 0922    CBC    Component Value Date/Time   WBC 7.9 08/27/2020 1029   RBC 3.14 (L) 08/27/2020 1029   HGB 9.0 (L) 08/27/2020 1029   HGB 13.0 02/05/2013 2355   HCT 25.6 (L) 08/27/2020 1029   HCT 36.7 (L) 02/05/2013 2355   PLT 229 08/27/2020 1029   PLT 106 (L) 02/05/2013 2355   MCV 81.5 08/27/2020 1029   MCV 93 02/05/2013  2355   MCH 28.7 08/27/2020 1029   MCHC 35.2 08/27/2020 1029   RDW 13.2 08/27/2020 1029   RDW 13.5 02/05/2013 2355   LYMPHSABS 1,864 02/16/2020 0922    LYMPHSABS 1.8 02/01/2013 1304   MONOABS 0.4 02/01/2013 1304   EOSABS 207 02/16/2020 0922   EOSABS 0.2 02/01/2013 1304   BASOSABS 30 02/16/2020 0922   BASOSABS 0.0 02/01/2013 1304    Hgb A1C Lab Results  Component Value Date   HGBA1C 10.4 (H) 08/27/2020            Assessment & Plan:     Webb Silversmith, NP This visit occurred during the SARS-CoV-2 public health emergency.  Safety protocols were in place, including screening questions prior to the visit, additional usage of staff PPE, and extensive cleaning of exam room while observing appropriate contact time as indicated for disinfecting solutions.

## 2020-10-17 ENCOUNTER — Encounter: Payer: Self-pay | Admitting: Internal Medicine

## 2020-10-17 DIAGNOSIS — D649 Anemia, unspecified: Secondary | ICD-10-CM | POA: Insufficient documentation

## 2020-10-17 NOTE — Assessment & Plan Note (Signed)
Persistent but he is not interested in medication treatment at this time Support offered

## 2020-10-17 NOTE — Assessment & Plan Note (Signed)
Stable on trazodone Will monitor

## 2020-10-17 NOTE — Assessment & Plan Note (Signed)
Encouraged weight loss as this can help reduce reflux symptoms Continue omeprazole

## 2020-10-17 NOTE — Assessment & Plan Note (Signed)
In remission Monitor

## 2020-10-17 NOTE — Assessment & Plan Note (Signed)
CBC reviewed Continue oral iron 

## 2020-10-17 NOTE — Assessment & Plan Note (Signed)
Encourage continued use of his CPAP Encouraged weight loss as this can help reduce sleep apnea symptoms

## 2020-10-17 NOTE — Patient Instructions (Signed)

## 2020-10-17 NOTE — Assessment & Plan Note (Signed)
Controlled on lisinopril and HCTZ Reinforced DASH diet and exercise for weight loss Will monitor

## 2020-10-17 NOTE — Assessment & Plan Note (Signed)
Unable to repeat A1c today No urine microalbumin secondary to ACEI therapy Encouraged him to consume a low-carb diet and exercise for weight loss Continue Ozempic, metformin and Basaglar Encourage routine foot exams Encourage routine eye exams Encouraged him to get a flu shot this fall Due for Pneumovax in December Prevnar UTD Encouraged him to get his COVID booster

## 2020-10-17 NOTE — Assessment & Plan Note (Signed)
Encouraged him to consume a low-fat diet Continue atorvastatin

## 2020-10-17 NOTE — Assessment & Plan Note (Signed)
He will continue albuterol nebs as needed, refilled today

## 2020-10-25 DIAGNOSIS — E785 Hyperlipidemia, unspecified: Secondary | ICD-10-CM | POA: Diagnosis not present

## 2020-10-25 DIAGNOSIS — Z7984 Long term (current) use of oral hypoglycemic drugs: Secondary | ICD-10-CM | POA: Diagnosis not present

## 2020-10-25 DIAGNOSIS — I10 Essential (primary) hypertension: Secondary | ICD-10-CM | POA: Diagnosis not present

## 2020-10-25 DIAGNOSIS — K219 Gastro-esophageal reflux disease without esophagitis: Secondary | ICD-10-CM | POA: Diagnosis not present

## 2020-10-25 DIAGNOSIS — Z87891 Personal history of nicotine dependence: Secondary | ICD-10-CM | POA: Diagnosis not present

## 2020-10-25 DIAGNOSIS — Z483 Aftercare following surgery for neoplasm: Secondary | ICD-10-CM | POA: Diagnosis not present

## 2020-10-25 DIAGNOSIS — G4730 Sleep apnea, unspecified: Secondary | ICD-10-CM | POA: Diagnosis not present

## 2020-10-25 DIAGNOSIS — C4442 Squamous cell carcinoma of skin of scalp and neck: Secondary | ICD-10-CM | POA: Diagnosis not present

## 2020-10-25 DIAGNOSIS — Z79899 Other long term (current) drug therapy: Secondary | ICD-10-CM | POA: Diagnosis not present

## 2020-10-25 DIAGNOSIS — Z9181 History of falling: Secondary | ICD-10-CM | POA: Diagnosis not present

## 2020-10-25 DIAGNOSIS — J432 Centrilobular emphysema: Secondary | ICD-10-CM | POA: Diagnosis not present

## 2020-10-25 DIAGNOSIS — E1136 Type 2 diabetes mellitus with diabetic cataract: Secondary | ICD-10-CM | POA: Diagnosis not present

## 2020-10-29 DIAGNOSIS — E785 Hyperlipidemia, unspecified: Secondary | ICD-10-CM | POA: Diagnosis not present

## 2020-10-29 DIAGNOSIS — Z79899 Other long term (current) drug therapy: Secondary | ICD-10-CM | POA: Diagnosis not present

## 2020-10-29 DIAGNOSIS — C4442 Squamous cell carcinoma of skin of scalp and neck: Secondary | ICD-10-CM | POA: Diagnosis not present

## 2020-10-29 DIAGNOSIS — Z7984 Long term (current) use of oral hypoglycemic drugs: Secondary | ICD-10-CM | POA: Diagnosis not present

## 2020-10-29 DIAGNOSIS — J432 Centrilobular emphysema: Secondary | ICD-10-CM | POA: Diagnosis not present

## 2020-10-29 DIAGNOSIS — E1136 Type 2 diabetes mellitus with diabetic cataract: Secondary | ICD-10-CM | POA: Diagnosis not present

## 2020-10-29 DIAGNOSIS — Z9181 History of falling: Secondary | ICD-10-CM | POA: Diagnosis not present

## 2020-10-29 DIAGNOSIS — G4730 Sleep apnea, unspecified: Secondary | ICD-10-CM | POA: Diagnosis not present

## 2020-10-29 DIAGNOSIS — I10 Essential (primary) hypertension: Secondary | ICD-10-CM | POA: Diagnosis not present

## 2020-10-29 DIAGNOSIS — K219 Gastro-esophageal reflux disease without esophagitis: Secondary | ICD-10-CM | POA: Diagnosis not present

## 2020-10-29 DIAGNOSIS — Z87891 Personal history of nicotine dependence: Secondary | ICD-10-CM | POA: Diagnosis not present

## 2020-10-29 DIAGNOSIS — Z483 Aftercare following surgery for neoplasm: Secondary | ICD-10-CM | POA: Diagnosis not present

## 2020-11-01 DIAGNOSIS — Z7984 Long term (current) use of oral hypoglycemic drugs: Secondary | ICD-10-CM | POA: Diagnosis not present

## 2020-11-01 DIAGNOSIS — G4730 Sleep apnea, unspecified: Secondary | ICD-10-CM | POA: Diagnosis not present

## 2020-11-01 DIAGNOSIS — C4442 Squamous cell carcinoma of skin of scalp and neck: Secondary | ICD-10-CM | POA: Diagnosis not present

## 2020-11-01 DIAGNOSIS — Z87891 Personal history of nicotine dependence: Secondary | ICD-10-CM | POA: Diagnosis not present

## 2020-11-01 DIAGNOSIS — I10 Essential (primary) hypertension: Secondary | ICD-10-CM | POA: Diagnosis not present

## 2020-11-01 DIAGNOSIS — J432 Centrilobular emphysema: Secondary | ICD-10-CM | POA: Diagnosis not present

## 2020-11-01 DIAGNOSIS — E785 Hyperlipidemia, unspecified: Secondary | ICD-10-CM | POA: Diagnosis not present

## 2020-11-01 DIAGNOSIS — Z79899 Other long term (current) drug therapy: Secondary | ICD-10-CM | POA: Diagnosis not present

## 2020-11-01 DIAGNOSIS — E1136 Type 2 diabetes mellitus with diabetic cataract: Secondary | ICD-10-CM | POA: Diagnosis not present

## 2020-11-01 DIAGNOSIS — Z9181 History of falling: Secondary | ICD-10-CM | POA: Diagnosis not present

## 2020-11-01 DIAGNOSIS — K219 Gastro-esophageal reflux disease without esophagitis: Secondary | ICD-10-CM | POA: Diagnosis not present

## 2020-11-01 DIAGNOSIS — Z483 Aftercare following surgery for neoplasm: Secondary | ICD-10-CM | POA: Diagnosis not present

## 2020-11-05 DIAGNOSIS — I10 Essential (primary) hypertension: Secondary | ICD-10-CM | POA: Diagnosis not present

## 2020-11-05 DIAGNOSIS — C4442 Squamous cell carcinoma of skin of scalp and neck: Secondary | ICD-10-CM | POA: Diagnosis not present

## 2020-11-05 DIAGNOSIS — Z7984 Long term (current) use of oral hypoglycemic drugs: Secondary | ICD-10-CM | POA: Diagnosis not present

## 2020-11-05 DIAGNOSIS — Z9181 History of falling: Secondary | ICD-10-CM | POA: Diagnosis not present

## 2020-11-05 DIAGNOSIS — Z79899 Other long term (current) drug therapy: Secondary | ICD-10-CM | POA: Diagnosis not present

## 2020-11-05 DIAGNOSIS — K219 Gastro-esophageal reflux disease without esophagitis: Secondary | ICD-10-CM | POA: Diagnosis not present

## 2020-11-05 DIAGNOSIS — G4730 Sleep apnea, unspecified: Secondary | ICD-10-CM | POA: Diagnosis not present

## 2020-11-05 DIAGNOSIS — E1136 Type 2 diabetes mellitus with diabetic cataract: Secondary | ICD-10-CM | POA: Diagnosis not present

## 2020-11-05 DIAGNOSIS — J432 Centrilobular emphysema: Secondary | ICD-10-CM | POA: Diagnosis not present

## 2020-11-05 DIAGNOSIS — Z87891 Personal history of nicotine dependence: Secondary | ICD-10-CM | POA: Diagnosis not present

## 2020-11-05 DIAGNOSIS — E785 Hyperlipidemia, unspecified: Secondary | ICD-10-CM | POA: Diagnosis not present

## 2020-11-05 DIAGNOSIS — Z483 Aftercare following surgery for neoplasm: Secondary | ICD-10-CM | POA: Diagnosis not present

## 2020-11-08 DIAGNOSIS — G4730 Sleep apnea, unspecified: Secondary | ICD-10-CM | POA: Diagnosis not present

## 2020-11-08 DIAGNOSIS — E785 Hyperlipidemia, unspecified: Secondary | ICD-10-CM | POA: Diagnosis not present

## 2020-11-08 DIAGNOSIS — Z87891 Personal history of nicotine dependence: Secondary | ICD-10-CM | POA: Diagnosis not present

## 2020-11-08 DIAGNOSIS — K219 Gastro-esophageal reflux disease without esophagitis: Secondary | ICD-10-CM | POA: Diagnosis not present

## 2020-11-08 DIAGNOSIS — E1136 Type 2 diabetes mellitus with diabetic cataract: Secondary | ICD-10-CM | POA: Diagnosis not present

## 2020-11-08 DIAGNOSIS — Z79899 Other long term (current) drug therapy: Secondary | ICD-10-CM | POA: Diagnosis not present

## 2020-11-08 DIAGNOSIS — Z9181 History of falling: Secondary | ICD-10-CM | POA: Diagnosis not present

## 2020-11-08 DIAGNOSIS — I10 Essential (primary) hypertension: Secondary | ICD-10-CM | POA: Diagnosis not present

## 2020-11-08 DIAGNOSIS — Z7984 Long term (current) use of oral hypoglycemic drugs: Secondary | ICD-10-CM | POA: Diagnosis not present

## 2020-11-08 DIAGNOSIS — J432 Centrilobular emphysema: Secondary | ICD-10-CM | POA: Diagnosis not present

## 2020-11-08 DIAGNOSIS — C4442 Squamous cell carcinoma of skin of scalp and neck: Secondary | ICD-10-CM | POA: Diagnosis not present

## 2020-11-08 DIAGNOSIS — Z483 Aftercare following surgery for neoplasm: Secondary | ICD-10-CM | POA: Diagnosis not present

## 2020-11-13 DIAGNOSIS — C4442 Squamous cell carcinoma of skin of scalp and neck: Secondary | ICD-10-CM | POA: Diagnosis not present

## 2020-11-14 DIAGNOSIS — Z483 Aftercare following surgery for neoplasm: Secondary | ICD-10-CM | POA: Diagnosis not present

## 2020-11-14 DIAGNOSIS — Z7984 Long term (current) use of oral hypoglycemic drugs: Secondary | ICD-10-CM | POA: Diagnosis not present

## 2020-11-14 DIAGNOSIS — E785 Hyperlipidemia, unspecified: Secondary | ICD-10-CM | POA: Diagnosis not present

## 2020-11-14 DIAGNOSIS — G4730 Sleep apnea, unspecified: Secondary | ICD-10-CM | POA: Diagnosis not present

## 2020-11-14 DIAGNOSIS — Z9181 History of falling: Secondary | ICD-10-CM | POA: Diagnosis not present

## 2020-11-14 DIAGNOSIS — Z87891 Personal history of nicotine dependence: Secondary | ICD-10-CM | POA: Diagnosis not present

## 2020-11-14 DIAGNOSIS — J432 Centrilobular emphysema: Secondary | ICD-10-CM | POA: Diagnosis not present

## 2020-11-14 DIAGNOSIS — K219 Gastro-esophageal reflux disease without esophagitis: Secondary | ICD-10-CM | POA: Diagnosis not present

## 2020-11-14 DIAGNOSIS — C4442 Squamous cell carcinoma of skin of scalp and neck: Secondary | ICD-10-CM | POA: Diagnosis not present

## 2020-11-14 DIAGNOSIS — I10 Essential (primary) hypertension: Secondary | ICD-10-CM | POA: Diagnosis not present

## 2020-11-14 DIAGNOSIS — E1136 Type 2 diabetes mellitus with diabetic cataract: Secondary | ICD-10-CM | POA: Diagnosis not present

## 2020-11-14 DIAGNOSIS — Z79899 Other long term (current) drug therapy: Secondary | ICD-10-CM | POA: Diagnosis not present

## 2020-11-15 DIAGNOSIS — G4730 Sleep apnea, unspecified: Secondary | ICD-10-CM | POA: Diagnosis not present

## 2020-11-15 DIAGNOSIS — E785 Hyperlipidemia, unspecified: Secondary | ICD-10-CM | POA: Diagnosis not present

## 2020-11-15 DIAGNOSIS — Z87891 Personal history of nicotine dependence: Secondary | ICD-10-CM | POA: Diagnosis not present

## 2020-11-15 DIAGNOSIS — I10 Essential (primary) hypertension: Secondary | ICD-10-CM | POA: Diagnosis not present

## 2020-11-15 DIAGNOSIS — Z7984 Long term (current) use of oral hypoglycemic drugs: Secondary | ICD-10-CM | POA: Diagnosis not present

## 2020-11-15 DIAGNOSIS — Z79899 Other long term (current) drug therapy: Secondary | ICD-10-CM | POA: Diagnosis not present

## 2020-11-15 DIAGNOSIS — E1136 Type 2 diabetes mellitus with diabetic cataract: Secondary | ICD-10-CM | POA: Diagnosis not present

## 2020-11-15 DIAGNOSIS — Z9181 History of falling: Secondary | ICD-10-CM | POA: Diagnosis not present

## 2020-11-15 DIAGNOSIS — Z483 Aftercare following surgery for neoplasm: Secondary | ICD-10-CM | POA: Diagnosis not present

## 2020-11-15 DIAGNOSIS — K219 Gastro-esophageal reflux disease without esophagitis: Secondary | ICD-10-CM | POA: Diagnosis not present

## 2020-11-15 DIAGNOSIS — C4442 Squamous cell carcinoma of skin of scalp and neck: Secondary | ICD-10-CM | POA: Diagnosis not present

## 2020-11-15 DIAGNOSIS — J432 Centrilobular emphysema: Secondary | ICD-10-CM | POA: Diagnosis not present

## 2020-11-19 DIAGNOSIS — J432 Centrilobular emphysema: Secondary | ICD-10-CM | POA: Diagnosis not present

## 2020-11-19 DIAGNOSIS — Z483 Aftercare following surgery for neoplasm: Secondary | ICD-10-CM | POA: Diagnosis not present

## 2020-11-19 DIAGNOSIS — E1136 Type 2 diabetes mellitus with diabetic cataract: Secondary | ICD-10-CM | POA: Diagnosis not present

## 2020-11-19 DIAGNOSIS — I10 Essential (primary) hypertension: Secondary | ICD-10-CM | POA: Diagnosis not present

## 2020-11-19 DIAGNOSIS — K219 Gastro-esophageal reflux disease without esophagitis: Secondary | ICD-10-CM | POA: Diagnosis not present

## 2020-11-19 DIAGNOSIS — Z87891 Personal history of nicotine dependence: Secondary | ICD-10-CM | POA: Diagnosis not present

## 2020-11-19 DIAGNOSIS — C4442 Squamous cell carcinoma of skin of scalp and neck: Secondary | ICD-10-CM | POA: Diagnosis not present

## 2020-11-19 DIAGNOSIS — Z9181 History of falling: Secondary | ICD-10-CM | POA: Diagnosis not present

## 2020-11-19 DIAGNOSIS — G4730 Sleep apnea, unspecified: Secondary | ICD-10-CM | POA: Diagnosis not present

## 2020-11-19 DIAGNOSIS — Z79899 Other long term (current) drug therapy: Secondary | ICD-10-CM | POA: Diagnosis not present

## 2020-11-19 DIAGNOSIS — E785 Hyperlipidemia, unspecified: Secondary | ICD-10-CM | POA: Diagnosis not present

## 2020-11-19 DIAGNOSIS — Z7984 Long term (current) use of oral hypoglycemic drugs: Secondary | ICD-10-CM | POA: Diagnosis not present

## 2020-11-22 DIAGNOSIS — K219 Gastro-esophageal reflux disease without esophagitis: Secondary | ICD-10-CM | POA: Diagnosis not present

## 2020-11-22 DIAGNOSIS — Z87891 Personal history of nicotine dependence: Secondary | ICD-10-CM | POA: Diagnosis not present

## 2020-11-22 DIAGNOSIS — Z483 Aftercare following surgery for neoplasm: Secondary | ICD-10-CM | POA: Diagnosis not present

## 2020-11-22 DIAGNOSIS — Z9181 History of falling: Secondary | ICD-10-CM | POA: Diagnosis not present

## 2020-11-22 DIAGNOSIS — G4730 Sleep apnea, unspecified: Secondary | ICD-10-CM | POA: Diagnosis not present

## 2020-11-22 DIAGNOSIS — Z7984 Long term (current) use of oral hypoglycemic drugs: Secondary | ICD-10-CM | POA: Diagnosis not present

## 2020-11-22 DIAGNOSIS — J432 Centrilobular emphysema: Secondary | ICD-10-CM | POA: Diagnosis not present

## 2020-11-22 DIAGNOSIS — E785 Hyperlipidemia, unspecified: Secondary | ICD-10-CM | POA: Diagnosis not present

## 2020-11-22 DIAGNOSIS — Z79899 Other long term (current) drug therapy: Secondary | ICD-10-CM | POA: Diagnosis not present

## 2020-11-22 DIAGNOSIS — I10 Essential (primary) hypertension: Secondary | ICD-10-CM | POA: Diagnosis not present

## 2020-11-22 DIAGNOSIS — E1136 Type 2 diabetes mellitus with diabetic cataract: Secondary | ICD-10-CM | POA: Diagnosis not present

## 2020-11-22 DIAGNOSIS — C4442 Squamous cell carcinoma of skin of scalp and neck: Secondary | ICD-10-CM | POA: Diagnosis not present

## 2020-11-26 DIAGNOSIS — Z79899 Other long term (current) drug therapy: Secondary | ICD-10-CM | POA: Diagnosis not present

## 2020-11-26 DIAGNOSIS — C4442 Squamous cell carcinoma of skin of scalp and neck: Secondary | ICD-10-CM | POA: Diagnosis not present

## 2020-11-26 DIAGNOSIS — Z483 Aftercare following surgery for neoplasm: Secondary | ICD-10-CM | POA: Diagnosis not present

## 2020-11-26 DIAGNOSIS — K219 Gastro-esophageal reflux disease without esophagitis: Secondary | ICD-10-CM | POA: Diagnosis not present

## 2020-11-26 DIAGNOSIS — G4730 Sleep apnea, unspecified: Secondary | ICD-10-CM | POA: Diagnosis not present

## 2020-11-26 DIAGNOSIS — Z9181 History of falling: Secondary | ICD-10-CM | POA: Diagnosis not present

## 2020-11-26 DIAGNOSIS — J432 Centrilobular emphysema: Secondary | ICD-10-CM | POA: Diagnosis not present

## 2020-11-26 DIAGNOSIS — I10 Essential (primary) hypertension: Secondary | ICD-10-CM | POA: Diagnosis not present

## 2020-11-26 DIAGNOSIS — E785 Hyperlipidemia, unspecified: Secondary | ICD-10-CM | POA: Diagnosis not present

## 2020-11-26 DIAGNOSIS — E1136 Type 2 diabetes mellitus with diabetic cataract: Secondary | ICD-10-CM | POA: Diagnosis not present

## 2020-11-26 DIAGNOSIS — Z7984 Long term (current) use of oral hypoglycemic drugs: Secondary | ICD-10-CM | POA: Diagnosis not present

## 2020-11-26 DIAGNOSIS — Z87891 Personal history of nicotine dependence: Secondary | ICD-10-CM | POA: Diagnosis not present

## 2020-11-29 DIAGNOSIS — G4730 Sleep apnea, unspecified: Secondary | ICD-10-CM | POA: Diagnosis not present

## 2020-11-29 DIAGNOSIS — F5101 Primary insomnia: Secondary | ICD-10-CM | POA: Diagnosis not present

## 2020-11-29 DIAGNOSIS — C4442 Squamous cell carcinoma of skin of scalp and neck: Secondary | ICD-10-CM | POA: Diagnosis not present

## 2020-11-29 DIAGNOSIS — J432 Centrilobular emphysema: Secondary | ICD-10-CM | POA: Diagnosis not present

## 2020-11-29 DIAGNOSIS — Z9181 History of falling: Secondary | ICD-10-CM | POA: Diagnosis not present

## 2020-11-29 DIAGNOSIS — Z7984 Long term (current) use of oral hypoglycemic drugs: Secondary | ICD-10-CM | POA: Diagnosis not present

## 2020-11-29 DIAGNOSIS — Z87891 Personal history of nicotine dependence: Secondary | ICD-10-CM | POA: Diagnosis not present

## 2020-11-29 DIAGNOSIS — E1136 Type 2 diabetes mellitus with diabetic cataract: Secondary | ICD-10-CM | POA: Diagnosis not present

## 2020-11-29 DIAGNOSIS — I10 Essential (primary) hypertension: Secondary | ICD-10-CM | POA: Diagnosis not present

## 2020-11-29 DIAGNOSIS — Z483 Aftercare following surgery for neoplasm: Secondary | ICD-10-CM | POA: Diagnosis not present

## 2020-11-29 DIAGNOSIS — K219 Gastro-esophageal reflux disease without esophagitis: Secondary | ICD-10-CM | POA: Diagnosis not present

## 2020-11-29 DIAGNOSIS — E785 Hyperlipidemia, unspecified: Secondary | ICD-10-CM | POA: Diagnosis not present

## 2020-12-03 ENCOUNTER — Telehealth: Payer: Self-pay | Admitting: *Deleted

## 2020-12-03 DIAGNOSIS — J432 Centrilobular emphysema: Secondary | ICD-10-CM | POA: Diagnosis not present

## 2020-12-03 DIAGNOSIS — I10 Essential (primary) hypertension: Secondary | ICD-10-CM | POA: Diagnosis not present

## 2020-12-03 DIAGNOSIS — E785 Hyperlipidemia, unspecified: Secondary | ICD-10-CM | POA: Diagnosis not present

## 2020-12-03 DIAGNOSIS — G4730 Sleep apnea, unspecified: Secondary | ICD-10-CM | POA: Diagnosis not present

## 2020-12-03 DIAGNOSIS — C4442 Squamous cell carcinoma of skin of scalp and neck: Secondary | ICD-10-CM | POA: Diagnosis not present

## 2020-12-03 DIAGNOSIS — Z483 Aftercare following surgery for neoplasm: Secondary | ICD-10-CM | POA: Diagnosis not present

## 2020-12-03 DIAGNOSIS — Z9181 History of falling: Secondary | ICD-10-CM | POA: Diagnosis not present

## 2020-12-03 DIAGNOSIS — K219 Gastro-esophageal reflux disease without esophagitis: Secondary | ICD-10-CM | POA: Diagnosis not present

## 2020-12-03 DIAGNOSIS — E1136 Type 2 diabetes mellitus with diabetic cataract: Secondary | ICD-10-CM | POA: Diagnosis not present

## 2020-12-03 DIAGNOSIS — Z87891 Personal history of nicotine dependence: Secondary | ICD-10-CM

## 2020-12-03 DIAGNOSIS — Z122 Encounter for screening for malignant neoplasm of respiratory organs: Secondary | ICD-10-CM

## 2020-12-03 DIAGNOSIS — Z7984 Long term (current) use of oral hypoglycemic drugs: Secondary | ICD-10-CM | POA: Diagnosis not present

## 2020-12-03 DIAGNOSIS — F5101 Primary insomnia: Secondary | ICD-10-CM | POA: Diagnosis not present

## 2020-12-03 NOTE — Telephone Encounter (Signed)
Spoke to patient via telephone re: scheduling his annual low dose lung screening CT scan. Former smoker, quit in 2020, 40 pk yrs. Scan scheduled for 12/11/20 @ 9:30 am.

## 2020-12-06 DIAGNOSIS — C4442 Squamous cell carcinoma of skin of scalp and neck: Secondary | ICD-10-CM | POA: Diagnosis not present

## 2020-12-06 DIAGNOSIS — K219 Gastro-esophageal reflux disease without esophagitis: Secondary | ICD-10-CM | POA: Diagnosis not present

## 2020-12-06 DIAGNOSIS — F5101 Primary insomnia: Secondary | ICD-10-CM | POA: Diagnosis not present

## 2020-12-06 DIAGNOSIS — E785 Hyperlipidemia, unspecified: Secondary | ICD-10-CM | POA: Diagnosis not present

## 2020-12-06 DIAGNOSIS — G4730 Sleep apnea, unspecified: Secondary | ICD-10-CM | POA: Diagnosis not present

## 2020-12-06 DIAGNOSIS — Z483 Aftercare following surgery for neoplasm: Secondary | ICD-10-CM | POA: Diagnosis not present

## 2020-12-06 DIAGNOSIS — E1136 Type 2 diabetes mellitus with diabetic cataract: Secondary | ICD-10-CM | POA: Diagnosis not present

## 2020-12-06 DIAGNOSIS — Z7984 Long term (current) use of oral hypoglycemic drugs: Secondary | ICD-10-CM | POA: Diagnosis not present

## 2020-12-06 DIAGNOSIS — I10 Essential (primary) hypertension: Secondary | ICD-10-CM | POA: Diagnosis not present

## 2020-12-06 DIAGNOSIS — J432 Centrilobular emphysema: Secondary | ICD-10-CM | POA: Diagnosis not present

## 2020-12-06 DIAGNOSIS — Z87891 Personal history of nicotine dependence: Secondary | ICD-10-CM | POA: Diagnosis not present

## 2020-12-06 DIAGNOSIS — Z9181 History of falling: Secondary | ICD-10-CM | POA: Diagnosis not present

## 2020-12-11 ENCOUNTER — Ambulatory Visit
Admission: RE | Admit: 2020-12-11 | Discharge: 2020-12-11 | Disposition: A | Discharge status: Home / Self Care | Payer: Medicare Other | Source: Ambulatory Visit | Attending: Acute Care | Admitting: Acute Care

## 2020-12-11 ENCOUNTER — Other Ambulatory Visit: Payer: Self-pay

## 2020-12-11 DIAGNOSIS — Z87891 Personal history of nicotine dependence: Secondary | ICD-10-CM | POA: Insufficient documentation

## 2020-12-11 DIAGNOSIS — Z122 Encounter for screening for malignant neoplasm of respiratory organs: Secondary | ICD-10-CM | POA: Insufficient documentation

## 2020-12-12 DIAGNOSIS — E785 Hyperlipidemia, unspecified: Secondary | ICD-10-CM | POA: Diagnosis not present

## 2020-12-12 DIAGNOSIS — I10 Essential (primary) hypertension: Secondary | ICD-10-CM | POA: Diagnosis not present

## 2020-12-12 DIAGNOSIS — E1136 Type 2 diabetes mellitus with diabetic cataract: Secondary | ICD-10-CM | POA: Diagnosis not present

## 2020-12-12 DIAGNOSIS — C4442 Squamous cell carcinoma of skin of scalp and neck: Secondary | ICD-10-CM | POA: Diagnosis not present

## 2020-12-12 DIAGNOSIS — K219 Gastro-esophageal reflux disease without esophagitis: Secondary | ICD-10-CM | POA: Diagnosis not present

## 2020-12-12 DIAGNOSIS — J432 Centrilobular emphysema: Secondary | ICD-10-CM | POA: Diagnosis not present

## 2020-12-12 DIAGNOSIS — Z483 Aftercare following surgery for neoplasm: Secondary | ICD-10-CM | POA: Diagnosis not present

## 2020-12-12 DIAGNOSIS — Z7984 Long term (current) use of oral hypoglycemic drugs: Secondary | ICD-10-CM | POA: Diagnosis not present

## 2020-12-12 DIAGNOSIS — F5101 Primary insomnia: Secondary | ICD-10-CM | POA: Diagnosis not present

## 2020-12-12 DIAGNOSIS — Z9181 History of falling: Secondary | ICD-10-CM | POA: Diagnosis not present

## 2020-12-12 DIAGNOSIS — G4730 Sleep apnea, unspecified: Secondary | ICD-10-CM | POA: Diagnosis not present

## 2020-12-12 DIAGNOSIS — Z87891 Personal history of nicotine dependence: Secondary | ICD-10-CM | POA: Diagnosis not present

## 2020-12-14 ENCOUNTER — Encounter: Payer: Self-pay | Admitting: *Deleted

## 2020-12-18 ENCOUNTER — Telehealth: Payer: Self-pay | Admitting: Internal Medicine

## 2020-12-18 DIAGNOSIS — I10 Essential (primary) hypertension: Secondary | ICD-10-CM | POA: Diagnosis not present

## 2020-12-18 DIAGNOSIS — E1136 Type 2 diabetes mellitus with diabetic cataract: Secondary | ICD-10-CM | POA: Diagnosis not present

## 2020-12-18 DIAGNOSIS — C4442 Squamous cell carcinoma of skin of scalp and neck: Secondary | ICD-10-CM | POA: Diagnosis not present

## 2020-12-18 DIAGNOSIS — Z7984 Long term (current) use of oral hypoglycemic drugs: Secondary | ICD-10-CM | POA: Diagnosis not present

## 2020-12-18 DIAGNOSIS — F5101 Primary insomnia: Secondary | ICD-10-CM | POA: Diagnosis not present

## 2020-12-18 DIAGNOSIS — Z87891 Personal history of nicotine dependence: Secondary | ICD-10-CM | POA: Diagnosis not present

## 2020-12-18 DIAGNOSIS — Z483 Aftercare following surgery for neoplasm: Secondary | ICD-10-CM | POA: Diagnosis not present

## 2020-12-18 DIAGNOSIS — E785 Hyperlipidemia, unspecified: Secondary | ICD-10-CM | POA: Diagnosis not present

## 2020-12-18 DIAGNOSIS — G4730 Sleep apnea, unspecified: Secondary | ICD-10-CM | POA: Diagnosis not present

## 2020-12-18 DIAGNOSIS — J432 Centrilobular emphysema: Secondary | ICD-10-CM | POA: Diagnosis not present

## 2020-12-18 DIAGNOSIS — Z9181 History of falling: Secondary | ICD-10-CM | POA: Diagnosis not present

## 2020-12-18 DIAGNOSIS — K219 Gastro-esophageal reflux disease without esophagitis: Secondary | ICD-10-CM | POA: Diagnosis not present

## 2020-12-18 NOTE — Telephone Encounter (Signed)
Christina rn with [facility] is calling to get verbal order to discontinue wound care of pt scalp it has fully healed

## 2020-12-19 NOTE — Telephone Encounter (Signed)
Verbal given 

## 2020-12-25 NOTE — Progress Notes (Signed)
Please call patient and let them  know their  low dose Ct was read as a Lung RADS 2: nodules that are benign in appearance and behavior with a very low likelihood of becoming a clinically active cancer due to size or lack of growth. Recommendation per radiology is for a repeat LDCT in 12 months. .Please let them  know we will order and schedule their  annual screening scan for  11/2021. Please let them  know there was notation of CAD on their  scan.  Please remind the patient  that this is a non-gated exam therefore degree or severity of disease  cannot be determined. Please have them  follow up with their PCP regarding potential risk factor modification, dietary therapy or pharmacologic therapy if clinically indicated. Pt.  is  currently on statin therapy. Please place order for annual  screening scan for  11/2021 and fax results to PCP. Thanks so much.  Notation of aortic atherosclerosis>> She is on a statin therapy. I do not see cards or echo in [place]. Please have them follow up with PCP. Thanks so much

## 2020-12-26 DIAGNOSIS — E1136 Type 2 diabetes mellitus with diabetic cataract: Secondary | ICD-10-CM | POA: Diagnosis not present

## 2020-12-26 DIAGNOSIS — F5101 Primary insomnia: Secondary | ICD-10-CM | POA: Diagnosis not present

## 2020-12-26 DIAGNOSIS — K219 Gastro-esophageal reflux disease without esophagitis: Secondary | ICD-10-CM | POA: Diagnosis not present

## 2020-12-26 DIAGNOSIS — Z9181 History of falling: Secondary | ICD-10-CM | POA: Diagnosis not present

## 2020-12-26 DIAGNOSIS — C4442 Squamous cell carcinoma of skin of scalp and neck: Secondary | ICD-10-CM | POA: Diagnosis not present

## 2020-12-26 DIAGNOSIS — Z87891 Personal history of nicotine dependence: Secondary | ICD-10-CM | POA: Diagnosis not present

## 2020-12-26 DIAGNOSIS — I10 Essential (primary) hypertension: Secondary | ICD-10-CM | POA: Diagnosis not present

## 2020-12-26 DIAGNOSIS — J432 Centrilobular emphysema: Secondary | ICD-10-CM | POA: Diagnosis not present

## 2020-12-26 DIAGNOSIS — Z483 Aftercare following surgery for neoplasm: Secondary | ICD-10-CM | POA: Diagnosis not present

## 2020-12-26 DIAGNOSIS — G4730 Sleep apnea, unspecified: Secondary | ICD-10-CM | POA: Diagnosis not present

## 2020-12-26 DIAGNOSIS — E785 Hyperlipidemia, unspecified: Secondary | ICD-10-CM | POA: Diagnosis not present

## 2020-12-26 DIAGNOSIS — Z7984 Long term (current) use of oral hypoglycemic drugs: Secondary | ICD-10-CM | POA: Diagnosis not present

## 2020-12-28 ENCOUNTER — Other Ambulatory Visit: Payer: Self-pay | Admitting: *Deleted

## 2020-12-28 DIAGNOSIS — Z87891 Personal history of nicotine dependence: Secondary | ICD-10-CM

## 2021-01-21 ENCOUNTER — Other Ambulatory Visit: Payer: Self-pay

## 2021-01-21 ENCOUNTER — Ambulatory Visit (INDEPENDENT_AMBULATORY_CARE_PROVIDER_SITE_OTHER): Payer: Medicare Other | Admitting: Internal Medicine

## 2021-01-21 ENCOUNTER — Encounter: Payer: Self-pay | Admitting: Internal Medicine

## 2021-01-21 ENCOUNTER — Ambulatory Visit: Payer: Medicare Other | Admitting: Internal Medicine

## 2021-01-21 VITALS — BP 132/81 | HR 91 | Temp 97.8°F | Resp 17 | Ht 66.0 in | Wt 152.6 lb

## 2021-01-21 DIAGNOSIS — I700 Atherosclerosis of aorta: Secondary | ICD-10-CM | POA: Diagnosis not present

## 2021-01-21 DIAGNOSIS — I10 Essential (primary) hypertension: Secondary | ICD-10-CM

## 2021-01-21 DIAGNOSIS — N1831 Chronic kidney disease, stage 3a: Secondary | ICD-10-CM

## 2021-01-21 DIAGNOSIS — E1169 Type 2 diabetes mellitus with other specified complication: Secondary | ICD-10-CM | POA: Diagnosis not present

## 2021-01-21 DIAGNOSIS — E782 Mixed hyperlipidemia: Secondary | ICD-10-CM | POA: Diagnosis not present

## 2021-01-21 NOTE — Progress Notes (Signed)
Subjective:    Patient ID: [name], male    DOB: 1955/09/30, 65 y.o.   MRN: [id number]  HPI  Patient presents to clinic today for follow-up of HTN, HLD and DM2.  HTN: His BP today is 132/81.  He is taking Lisinopril and HCTZ as prescribed.  He stopped taking Basaglar and Ozempic as he reports these were temporary medications prescribed to him.  ECG from 10/2011 reviewed.  HLD: His last LDL was 45, triglycerides 233, 01/2020.  He denies myalgias on Atorvastatin.  He tries to consume a low-fat diet.  DM2: His last A1c was 10.4, 08/2020.  He is taking Metformin as prescribed.  He does not check his sugars routinely.  He checks his feet routinely.  His last eye exam was 01/2020.  Flu 01/2020.  Pneumovax 01/2011.  Prevnar 04/2020.  [place] x 2.  CKD 3: His last creatinine was 1.6, GFR 40.  He is on lisinopril for renal protection.  He does not follow with nephrology.  Review of Systems     Past Medical History:  Diagnosis Date   Anxiety    COPD (chronic obstructive pulmonary disease) ([place])    Diabetes mellitus without complication ([facility])    GERD (gastroesophageal reflux disease)    Hypertension    Insomnia    Personal history of tobacco use, presenting hazards to health 05/31/2015    Current Outpatient Medications  Medication Sig Dispense Refill   Accu-Chek FastClix Lancets MISC Use to check blood sugar up to twice a day 204 each 3   ACCU-CHEK GUIDE test strip Use to check blood sugar up to twice a day 200 each 3   albuterol (PROVENTIL) (2.5 MG/3ML) 0.083% nebulizer solution Take 3 mLs (2.5 mg total) by nebulization every 6 (six) hours as needed for wheezing or shortness of breath. 75 mL 1   atorvastatin (LIPITOR) 40 MG tablet TAKE 1 TABLET BY MOUTH AT BEDTIME FOR HIGH CHOLESTEROL 90 tablet 0   Blood Glucose Monitoring Suppl (ACCU-CHEK GUIDE ME) w/Device KIT Use to check blood sugar up to 2 times daily 1 kit 0   hydrochlorothiazide (MICROZIDE) 12.5 MG capsule TAKE ONE  CAPSULE BY MOUTH ONCE DAILY FOR HIGH BLOOD PRESSURE 90 capsule 0   Insulin Glargine (BASAGLAR KWIKPEN) 100 UNIT/ML Inject 5 Units into the skin daily. 3 mL 0   Insulin Pen Needle (NOVOFINE PEN NEEDLE) 32G X 6 MM MISC Use with insulin injection once daily and Ozempic once weekly. 90 each 1   Iron, Ferrous Sulfate, 325 (65 Fe) MG TABS Take 1 tablet by mouth daily. 30 tablet 0   lisinopril (ZESTRIL) 20 MG tablet Take 1 tablet (20 mg total) by mouth daily. for high blood pressure 90 tablet 0   Melatonin 10 MG CAPS Take by mouth.     metFORMIN (GLUCOPHAGE) 1000 MG tablet Take 1 tablet twice daily with food for Diabetes 180 tablet 0   omeprazole (PRILOSEC) 20 MG capsule Take 1 capsule (20 mg total) by mouth daily. 90 capsule 0   oxyCODONE (OXY IR/ROXICODONE) 5 MG immediate release tablet Take 5 mg by mouth every 6 (six) hours as needed.     OZEMPIC, 0.25 OR 0.5 MG/DOSE, 2 MG/1.5ML SOPN Inject 0.25 mg into the skin once a week. For first 4 weeks. Then increase dose to 0.91m weekly 1.5 mL 0   traZODone (DESYREL) 100 MG tablet Take 0.5-1 tablets (50-100 mg total) by mouth at bedtime as needed for sleep. 90 tablet 0   No  current facility-administered medications for this visit.    Allergies  Allergen Reactions   Codeine     Other reaction(s): Vomiting    Family History  Problem Relation Age of Onset   Renal Disease Mother 40   Heart disease Father    Stroke Father    Alzheimer's disease Father    Stroke Brother    Diabetes Brother    Diabetes Maternal Grandfather     Social History   Socioeconomic History   Marital status: Divorced    Spouse name: Not on file   Number of children: 3   Years of education: Not on file   Highest education level: 10th grade  Occupational History   Occupation: retired  Tobacco Use   Smoking status: Former    Packs/day: 1.00    Years: 40.00    Pack years: 40.00    Types: Cigarettes    Quit date: 07/25/2018    Years since quitting: 2.4   Smokeless  tobacco: Former  Scientific laboratory technician Use: Never used  Substance and Sexual Activity   Alcohol use: Not Currently    Comment: quit 2003, drank about 5 beer per week prior   Drug use: Never   Sexual activity: Yes    Birth control/protection: None  Other Topics Concern   Not on file  Social History Narrative   Not on file   Social Determinants of Health   Financial Resource Strain: Low Risk    Difficulty of Paying Living Expenses: Not hard at all  Food Insecurity: No Food Insecurity   Worried About Charity fundraiser in the Last Year: Never true   [place] in the Last Year: Never true  Transportation Needs: No Transportation Needs   Lack of Transportation (Medical): No   Lack of Transportation (Non-Medical): No  Physical Activity: Inactive   Days of Exercise per Week: 0 days   Minutes of Exercise per Session: 0 min  Stress: No Stress Concern Present   Feeling of Stress : Not at all  Social Connections: Not on file  Intimate Partner Violence: Not on file     Constitutional: Denies fever, malaise, fatigue, headache or abrupt weight changes.  HEENT: Denies eye pain, eye redness, ear pain, ringing in the ears, wax buildup, runny nose, nasal congestion, bloody nose, or sore throat. Respiratory: Denies difficulty breathing, shortness of breath, cough or sputum production.   Cardiovascular: Denies chest pain, chest tightness, palpitations or swelling in the hands or feet.  Skin: Denies redness, rashes, lesions or ulcercations.  Neurological: Denies dizziness, difficulty with memory, difficulty with speech or problems with balance and coordination.   No other specific complaints in a complete review of systems (except as listed in HPI above).  Objective:   Physical Exam  BP 132/81 (BP Location: Right Arm, Patient Position: Sitting, Cuff Size: Normal)   Pulse 91   Temp 97.8 F (36.6 C) (Temporal)   Resp 17   Ht $R'5\' 6"'wP$  (1.676 m)   Wt 152 lb 9.6 oz (69.2 kg)   SpO2 100%    BMI 24.63 kg/m   Wt Readings from Last 3 Encounters:  12/11/20 153 lb (69.4 kg)  10/16/20 148 lb 12.8 oz (67.5 kg)  10/09/20 155 lb (70.3 kg)    General: Appears his stated age, well developed, well nourished in NAD. Skin: Warm, dry and intact. No ulcerations noted. HEENT: Head: normal shape and size; Eyes: sclera white and EOMs intact;  Cardiovascular: Normal rate and  rhythm. S1,S2 noted.  No murmur, rubs or gallops noted. No JVD or BLE edema. No carotid bruits noted. Pulmonary/Chest: Normal effort and positive vesicular breath sounds. No respiratory distress. No wheezes, rales or ronchi noted.  Neurological: Alert and oriented.  BMET    Component Value Date/Time   NA 130 (L) 08/27/2020 1029   NA 137 02/05/2013 2355   K 4.3 08/27/2020 1029   K 3.8 02/05/2013 2355   CL 99 08/27/2020 1029   CL 107 02/05/2013 2355   CO2 21 (L) 08/27/2020 1029   CO2 23 02/05/2013 2355   GLUCOSE 444 (H) 08/27/2020 1029   GLUCOSE 113 (H) 02/05/2013 2355   BUN 32 (H) 08/27/2020 1029   BUN 9 02/05/2013 2355   CREATININE 1.60 (H) 08/27/2020 1029   CREATININE 1.23 02/16/2020 0922   CALCIUM 8.0 (L) 08/27/2020 1029   CALCIUM 8.9 02/05/2013 2355   GFRNONAA 47 (L) 08/27/2020 1029   GFRNONAA 61 02/16/2020 0922   GFRAA 70 02/16/2020 0922    Lipid Panel     Component Value Date/Time   CHOL 109 02/16/2020 0922   TRIG 233 (H) 02/16/2020 0922   HDL 35 (L) 02/16/2020 0922   CHOLHDL 3.1 02/16/2020 0922   LDLCALC 45 02/16/2020 0922    CBC    Component Value Date/Time   WBC 7.9 08/27/2020 1029   RBC 3.14 (L) 08/27/2020 1029   HGB 9.0 (L) 08/27/2020 1029   HGB 13.0 02/05/2013 2355   HCT 25.6 (L) 08/27/2020 1029   HCT 36.7 (L) 02/05/2013 2355   PLT 229 08/27/2020 1029   PLT 106 (L) 02/05/2013 2355   MCV 81.5 08/27/2020 1029   MCV 93 02/05/2013 2355   MCH 28.7 08/27/2020 1029   MCHC 35.2 08/27/2020 1029   RDW 13.2 08/27/2020 1029   RDW 13.5 02/05/2013 2355   LYMPHSABS 1,864 02/16/2020 0922    LYMPHSABS 1.8 02/01/2013 1304   MONOABS 0.4 02/01/2013 1304   EOSABS 207 02/16/2020 0922   EOSABS 0.2 02/01/2013 1304   BASOSABS 30 02/16/2020 0922   BASOSABS 0.0 02/01/2013 1304    Hgb A1C Lab Results  Component Value Date   HGBA1C 10.4 (H) 08/27/2020          Assessment & Plan:   Webb Silversmith, NP This visit occurred during the SARS-CoV-2 public health emergency.  Safety protocols were in place, including screening questions prior to the visit, additional usage of staff PPE, and extensive cleaning of exam room while observing appropriate contact time as indicated for disinfecting solutions.

## 2021-01-21 NOTE — Assessment & Plan Note (Signed)
On lisinopril for renal protection 

## 2021-01-21 NOTE — Assessment & Plan Note (Signed)
Controlled on Lisinopril and HCTZ Reinforced DASH diet  C-Met today

## 2021-01-21 NOTE — Assessment & Plan Note (Signed)
A1c today No urine microalbumin secondary to ACEI therapy Encouraged him to consume a low carb diet Continue Metformin Encourage routine foot exams Encourage routine eye exams Immunizations UTD

## 2021-01-21 NOTE — Assessment & Plan Note (Signed)
C-Met and lipid profile today Encouraged him to consume a low-fat diet Continue Atorvastatin 

## 2021-01-22 LAB — COMPLETE METABOLIC PANEL WITH GFR
AG Ratio: 1.4 (calc) (ref 1.0–2.5)
ALT: 23 U/L (ref 9–46)
AST: 17 U/L (ref 10–35)
Albumin: 4.2 g/dL (ref 3.6–5.1)
Alkaline phosphatase (APISO): 90 U/L (ref 35–144)
BUN: 17 mg/dL (ref 7–25)
CO2: 25 mmol/L (ref 20–32)
Calcium: 9 mg/dL (ref 8.6–10.3)
Chloride: 104 mmol/L (ref 98–110)
Creat: 1.13 mg/dL (ref 0.70–1.35)
Globulin: 2.9 g/dL (calc) (ref 1.9–3.7)
Glucose, Bld: 272 mg/dL — ABNORMAL HIGH (ref 65–99)
Potassium: 4.1 mmol/L (ref 3.5–5.3)
Sodium: 139 mmol/L (ref 135–146)
Total Bilirubin: 0.5 mg/dL (ref 0.2–1.2)
Total Protein: 7.1 g/dL (ref 6.1–8.1)
eGFR: 71 mL/min/{1.73_m2} (ref >=60)

## 2021-01-22 LAB — LIPID PANEL
Cholesterol: 110 mg/dL (ref <200)
HDL: 32 mg/dL — ABNORMAL LOW (ref >=40)
LDL Cholesterol (Calc): 47 mg/dL (calc)
Non-HDL Cholesterol (Calc): 78 mg/dL (calc) (ref <130)
Total CHOL/HDL Ratio: 3.4 (calc) (ref <5.0)
Triglycerides: 296 mg/dL — ABNORMAL HIGH (ref <150)

## 2021-01-22 LAB — HEMOGLOBIN A1C
Hgb A1c MFr Bld: 7.4 % of total Hgb — ABNORMAL HIGH (ref <5.7)
Mean Plasma Glucose: 166 mg/dL
eAG (mmol/L): 9.2 mmol/L

## 2021-01-24 MED ORDER — OZEMPIC (0.25 OR 0.5 MG/DOSE) 2 MG/1.5ML ~~LOC~~ SOPN: 0.5000 mg | PEN_INJECTOR | SUBCUTANEOUS | 1 refills | Status: DC

## 2021-01-24 MED ORDER — EZETIMIBE 10 MG PO TABS: 10.0000 mg | ORAL_TABLET | Freq: Every day | ORAL | 0 refills | Status: DC

## 2021-01-24 NOTE — Addendum Note (Signed)
Addended by: Jearld Fenton on: 01/24/2021 07:57 AM   Modules accepted: Orders

## 2021-02-05 ENCOUNTER — Other Ambulatory Visit: Payer: Self-pay

## 2021-02-08 ENCOUNTER — Other Ambulatory Visit: Payer: Self-pay | Admitting: Internal Medicine

## 2021-02-08 DIAGNOSIS — F5101 Primary insomnia: Secondary | ICD-10-CM

## 2021-02-08 MED ORDER — TRAZODONE HCL 100 MG PO TABS: 50.0000 mg | ORAL_TABLET | Freq: Every evening | ORAL | 0 refills | Status: DC | PRN

## 2021-02-08 NOTE — Telephone Encounter (Signed)
Medication Refill - Medication: traZODone (DESYREL) 100 MG tablet   Has the patient contacted their pharmacy? yes (Agent: If no, request that the patient contact the pharmacy for the refill.) (Agent: If yes, when and what did the pharmacy advise?)pharmacy called directly  Preferred Pharmacy (with phone number or street name):  [facility] [place] [place] Phone:  [phone]  Fax:  [phone]     Has the patient been seen for an appointment in the last year OR does the patient have an upcoming appointment? yes Agent: Please be advised that RX refills may take up to 3 business days. We ask that you follow-up with your pharmacy.

## 2021-03-26 DIAGNOSIS — I10 Essential (primary) hypertension: Secondary | ICD-10-CM | POA: Diagnosis not present

## 2021-03-26 DIAGNOSIS — E113293 Type 2 diabetes mellitus with mild nonproliferative diabetic retinopathy without macular edema, bilateral: Secondary | ICD-10-CM | POA: Diagnosis not present

## 2021-03-26 DIAGNOSIS — H2513 Age-related nuclear cataract, bilateral: Secondary | ICD-10-CM | POA: Diagnosis not present

## 2021-03-26 LAB — HM DIABETES EYE EXAM

## 2021-03-28 ENCOUNTER — Ambulatory Visit (INDEPENDENT_AMBULATORY_CARE_PROVIDER_SITE_OTHER): Payer: Medicare Other

## 2021-03-28 ENCOUNTER — Other Ambulatory Visit: Payer: Self-pay

## 2021-03-28 VITALS — Wt 152.0 lb

## 2021-03-28 DIAGNOSIS — Z23 Encounter for immunization: Secondary | ICD-10-CM

## 2021-04-16 ENCOUNTER — Other Ambulatory Visit: Payer: Self-pay | Admitting: Internal Medicine

## 2021-04-16 ENCOUNTER — Other Ambulatory Visit: Payer: Self-pay | Admitting: Family Medicine

## 2021-04-16 DIAGNOSIS — Z794 Long term (current) use of insulin: Secondary | ICD-10-CM

## 2021-04-16 DIAGNOSIS — E785 Hyperlipidemia, unspecified: Secondary | ICD-10-CM

## 2021-04-16 DIAGNOSIS — I10 Essential (primary) hypertension: Secondary | ICD-10-CM

## 2021-04-16 DIAGNOSIS — F5101 Primary insomnia: Secondary | ICD-10-CM

## 2021-04-16 DIAGNOSIS — E1169 Type 2 diabetes mellitus with other specified complication: Secondary | ICD-10-CM

## 2021-04-16 DIAGNOSIS — K219 Gastro-esophageal reflux disease without esophagitis: Secondary | ICD-10-CM

## 2021-04-16 NOTE — Telephone Encounter (Signed)
Request is early: Trazodone #90 02/08/21 Ozempic 4.5 ml 1RF 01/24/21  Requested Prescriptions  Pending Prescriptions Disp Refills  . traZODone (DESYREL) 100 MG tablet [Pharmacy Med Name: TRAZODONE HCL 100 MG TAB] 90 tablet 0    Sig: TAKE 1/2-1 TABLET BY MOUTH AT BEDTIME ASNEEDED FOR SLEEP     Psychiatry: Antidepressants - Serotonin Modulator Passed - 04/16/2021 11:16 AM      Passed - Valid encounter within last 6 months    Recent Outpatient Visits          2 months ago Essential hypertension   [facility] [place], [place], NP   6 months ago Type 2 diabetes mellitus with other specified complication, without long-term current use of insulin [facility])   [facility] [place], Coralie Keens, NP   7 months ago Type 2 diabetes mellitus with other specified complication, without long-term current use of insulin [facility])   [facility] Olin Hauser, DO   8 months ago Balance disorder   [place], DO   9 months ago SCC (squamous cell carcinoma), scalp/neck   [place], DO      Future Appointments            In 1 week Baity, Coralie Keens, NP [facility], [place]   In 6 months  [facility], [facility]           . OZEMPIC, 0.25 OR 0.5 MG/DOSE, 2 MG/1.5ML SOPN [Pharmacy Med Name: OZEMPIC (0.25 OR 0.5 MG/DOSE) 2 MG/] 4.5 mL 1    Sig: INJECT 0.5MG  INTO THE SKIN ONCE A WEEK     Endocrinology:  Diabetes - GLP-1 Receptor Agonists Passed - 04/16/2021 11:16 AM      Passed - HBA1C is between 0 and 7.9 and within 180 days    Hgb A1c MFr Bld  Date Value Ref Range Status  01/21/2021 7.4 (H) <5.7 % of total Hgb Final    Comment:    For someone without known diabetes, a hemoglobin A1c value of 6.5% or greater indicates that they may have  diabetes and this should be confirmed with a follow-up  test. . For someone with known diabetes, a value <7%  indicates  that their diabetes is well controlled and a value  greater than or equal to 7% indicates suboptimal  control. A1c targets should be individualized based on  duration of diabetes, age, comorbid conditions, and  other considerations. . Currently, no consensus exists regarding use of hemoglobin A1c for diagnosis of diabetes for children. Renella Cunas - Valid encounter within last 6 months    Recent Outpatient Visits          2 months ago Essential hypertension   [facility] [place], [place], NP   6 months ago Type 2 diabetes mellitus with other specified complication, without long-term current use of insulin [facility])   [facility] [place], Coralie Keens, NP   7 months ago Type 2 diabetes mellitus with other specified complication, without long-term current use of insulin [facility])   [facility] Olin Hauser, DO   8 months ago Balance disorder   [place], DO   9 months ago SCC (squamous cell carcinoma), scalp/neck   [place], DO      Future  Appointments            In 1 week Baity, Coralie Keens, NP [facility], [place]   In 6 months  [facility], [place]

## 2021-04-23 ENCOUNTER — Ambulatory Visit (INDEPENDENT_AMBULATORY_CARE_PROVIDER_SITE_OTHER): Payer: Medicare Other | Admitting: Internal Medicine

## 2021-04-23 ENCOUNTER — Other Ambulatory Visit: Payer: Self-pay

## 2021-04-23 ENCOUNTER — Encounter: Payer: Self-pay | Admitting: Internal Medicine

## 2021-04-23 VITALS — BP 106/67 | HR 85 | Temp 97.7°F | Resp 17 | Ht 66.0 in | Wt 145.0 lb

## 2021-04-23 DIAGNOSIS — E782 Mixed hyperlipidemia: Secondary | ICD-10-CM

## 2021-04-23 DIAGNOSIS — N1831 Chronic kidney disease, stage 3a: Secondary | ICD-10-CM | POA: Diagnosis not present

## 2021-04-23 DIAGNOSIS — I10 Essential (primary) hypertension: Secondary | ICD-10-CM | POA: Diagnosis not present

## 2021-04-23 DIAGNOSIS — E1169 Type 2 diabetes mellitus with other specified complication: Secondary | ICD-10-CM

## 2021-04-23 DIAGNOSIS — I700 Atherosclerosis of aorta: Secondary | ICD-10-CM

## 2021-04-23 NOTE — Assessment & Plan Note (Signed)
CMET and lipid profile today Encouraged him to consume a low fat diet Continue Atorvastatin, advised him to start taking Ezetimibe as prescribed

## 2021-04-23 NOTE — Assessment & Plan Note (Signed)
A1C today No urine microalbumin secondary to ACEI therapy Encouraged him to consume a low carb diet Will request copy of eye exam Encouraged routine foot exams Continue Metformin and Ozempic Flu, pneuomovax and prevnar UTD Encouraged him to get his covid booster

## 2021-04-23 NOTE — Assessment & Plan Note (Signed)
Controlled on Lisinopril HCT BMET today Reinforced DASH diet

## 2021-04-23 NOTE — Patient Instructions (Signed)
Heart-Healthy Eating Plan Heart-healthy meal planning includes: Eating less unhealthy fats. Eating more healthy fats. Making other changes in your diet. Talk with your doctor or a diet specialist (dietitian) to create an eating plan that is right for you. What is my plan? Your doctor may recommend an eating plan that includes: Total fat: ______% or less of total calories a day. Saturated fat: ______% or less of total calories a day. Cholesterol: less than _________mg a day. What are tips for following this plan? Cooking Avoid frying your food. Try to bake, boil, grill, or broil it instead. You can also reduce fat by: Removing the skin from poultry. Removing all visible fats from meats. Steaming vegetables in water or broth. Meal planning  At meals, divide your plate into four equal parts: Fill one-half of your plate with vegetables and green salads. Fill one-fourth of your plate with whole grains. Fill one-fourth of your plate with lean protein foods. Eat 4-5 servings of vegetables per day. A serving of vegetables is: 1 cup of raw or cooked vegetables. 2 cups of raw leafy greens. Eat 4-5 servings of fruit per day. A serving of fruit is: 1 medium whole fruit.  cup of dried fruit.  cup of fresh, frozen, or canned fruit.  cup of 100% fruit juice. Eat more foods that have soluble fiber. These are apples, broccoli, carrots, beans, peas, and barley. Try to get 20-30 g of fiber per day. Eat 4-5 servings of nuts, legumes, and seeds per week: 1 serving of dried beans or legumes equals  cup after being cooked. 1 serving of nuts is  cup. 1 serving of seeds equals 1 tablespoon. General information Eat more home-cooked food. Eat less restaurant, buffet, and fast food. Limit or avoid alcohol. Limit foods that are high in starch and sugar. Avoid fried foods. Lose weight if you are overweight. Keep track of how much salt (sodium) you eat. This is important if you have high blood  pressure. Ask your doctor to tell you more about this. Try to add vegetarian meals each week. Fats Choose healthy fats. These include olive oil and canola oil, flaxseeds, walnuts, almonds, and seeds. Eat more omega-3 fats. These include salmon, mackerel, sardines, tuna, flaxseed oil, and ground flaxseeds. Try to eat fish at least 2 times each week. Check food labels. Avoid foods with trans fats or high amounts of saturated fat. Limit saturated fats. These are often found in animal products, such as meats, butter, and cream. These are also found in plant foods, such as palm oil, palm kernel oil, and coconut oil. Avoid foods with partially hydrogenated oils in them. These have trans fats. Examples are stick margarine, some tub margarines, cookies, crackers, and other baked goods. What foods can I eat? Fruits All fresh, canned (in natural juice), or frozen fruits. Vegetables Fresh or frozen vegetables (raw, steamed, roasted, or grilled). Green salads. Grains Most grains. Choose whole wheat and whole grains most of the time. Rice and pasta, including brown rice and pastas made with whole wheat. Meats and other proteins Lean, well-trimmed beef, veal, pork, and lamb. Chicken and turkey without skin. All fish and shellfish. Wild duck, rabbit, pheasant, and venison. Egg whites or low-cholesterol egg substitutes. Dried beans, peas, lentils, and tofu. Seeds and most nuts. Dairy Low-fat or nonfat cheeses, including ricotta and mozzarella. Skim or 1% milk that is liquid, powdered, or evaporated. Buttermilk that is made with low-fat milk. Nonfat or low-fat yogurt. Fats and oils Non-hydrogenated (trans-free) margarines. Vegetable oils, including   soybean, sesame, sunflower, olive, peanut, safflower, corn, canola, and cottonseed. Salad dressings or mayonnaise made with a vegetable oil. Beverages Mineral water. Coffee and tea. Diet carbonated beverages. Sweets and desserts Sherbet, gelatin, and fruit ice.  Small amounts of dark chocolate. Limit all sweets and desserts. Seasonings and condiments All seasonings and condiments. The items listed above may not be a complete list of foods and drinks you can eat. Contact a dietitian for more options. What foods should I avoid? Fruits Canned fruit in heavy syrup. Fruit in cream or butter sauce. Fried fruit. Limit coconut. Vegetables Vegetables cooked in cheese, cream, or butter sauce. Fried vegetables. Grains Breads that are made with saturated or trans fats, oils, or whole milk. Croissants. Sweet rolls. Donuts. High-fat crackers, such as cheese crackers. Meats and other proteins Fatty meats, such as hot dogs, ribs, sausage, bacon, rib-eye roast or steak. High-fat deli meats, such as salami and bologna. Caviar. Domestic duck and goose. Organ meats, such as liver. Dairy Cream, sour cream, cream cheese, and creamed cottage cheese. Whole-milk cheeses. Whole or 2% milk that is liquid, evaporated, or condensed. Whole buttermilk. Cream sauce or high-fat cheese sauce. Yogurt that is made from whole milk. Fats and oils Meat fat, or shortening. Cocoa butter, hydrogenated oils, palm oil, coconut oil, palm kernel oil. Solid fats and shortenings, including bacon fat, salt pork, lard, and butter. Nondairy cream substitutes. Salad dressings with cheese or sour cream. Beverages Regular sodas and juice drinks with added sugar. Sweets and desserts Frosting. Pudding. Cookies. Cakes. Pies. Milk chocolate or white chocolate. Buttered syrups. Full-fat ice cream or ice cream drinks. The items listed above may not be a complete list of foods and drinks to avoid. Contact a dietitian for more information. Summary Heart-healthy meal planning includes eating less unhealthy fats, eating more healthy fats, and making other changes in your diet. Eat a balanced diet. This includes fruits and vegetables, low-fat or nonfat dairy, lean protein, nuts and legumes, whole grains, and  heart-healthy oils and fats. This information is not intended to replace advice given to you by your health care provider. Make sure you discuss any questions you have with your health care provider. Document Revised: 09/20/2020 Document Reviewed: 09/20/2020 Elsevier Patient Education  2022 Elsevier Inc.  

## 2021-04-23 NOTE — Assessment & Plan Note (Signed)
CMET and lipid profile today Continue Atorvastatin, start taking Ezetimibe as prescribed Will discuss starting baby ASA pending labs

## 2021-04-23 NOTE — Assessment & Plan Note (Signed)
BMET today

## 2021-04-23 NOTE — Progress Notes (Signed)
Subjective:    Patient ID: [name], male    DOB: 12/12/1955, 65 y.o.   MRN: [id number]  HPI  Pt presents to the clinic today for 3 months follow up of HTN, HLD, CKD and DM2:  HTN: His BP today is 106/67. He is taking Lisinopril and HCTZ as prescribed. ECG from 10/2011 reviewed.  HLD with Aortic Atherosclerosis: His last LDL was, triglycerides. He denies myalgias on Atorvastatin but is not taking Ezetimibe. He tries to consume a low fat diet.  DM 2: His last A1C was 7.4%, 12/2020. He is taking Metformin and Ozempic as prescribed. He sugars 94-109. He checks his feet routinely. His last eye exam was 03/2021. Flu 03/2021. Pneumovax 01/2011. Prevnar 04/2020. XCovid: Pfizer x 3  CKD 3: His last creatinine was 1.13, GFR 79, 12/2020. He is on Lisinopril for renal protection. He does not follow with nephrology.  Review of Systems     Past Medical History:  Diagnosis Date   Anxiety    COPD (chronic obstructive pulmonary disease) ([place])    Diabetes mellitus without complication (HCC)    GERD (gastroesophageal reflux disease)    Hypertension    Insomnia    Personal history of tobacco use, presenting hazards to health 05/31/2015    Current Outpatient Medications  Medication Sig Dispense Refill   Accu-Chek FastClix Lancets MISC Use to check blood sugar up to twice a day 204 each 3   ACCU-CHEK GUIDE test strip Use to check blood sugar up to twice a day 200 each 3   albuterol (PROVENTIL) (2.5 MG/3ML) 0.083% nebulizer solution Take 3 mLs (2.5 mg total) by nebulization every 6 (six) hours as needed for wheezing or shortness of breath. 75 mL 1   atorvastatin (LIPITOR) 40 MG tablet TAKE 1 TABLET BY MOUTH AT BEDTIME FOR HIGH CHOLESTEROL 90 tablet 0   Blood Glucose Monitoring Suppl (ACCU-CHEK GUIDE ME) w/Device KIT Use to check blood sugar up to 2 times daily 1 kit 0   ezetimibe (ZETIA) 10 MG tablet Take 1 tablet (10 mg total) by mouth daily. 90 tablet 0   hydrochlorothiazide (MICROZIDE) 12.5  MG capsule TAKE 1 CAPSULE BY MOUTH ONCE DAILY HIGH BLOOD PRESSURE 90 capsule 0   Iron, Ferrous Sulfate, 325 (65 Fe) MG TABS Take 1 tablet by mouth daily. 30 tablet 0   lisinopril (ZESTRIL) 20 MG tablet TAKE 1 TABLET BY MOUTH ONCE DAILY HIGH BLOOD PRESSURE 90 tablet 0   metFORMIN (GLUCOPHAGE) 1000 MG tablet Take 1 tablet twice daily with food for Diabetes (Patient taking differently: Take 1,000 mg by mouth 2 (two) times daily with a meal. Take 1 tablet twice daily with food for Diabetes) 180 tablet 0   metFORMIN (GLUCOPHAGE) 1000 MG tablet 1,000 mg two (2) times a day.     metFORMIN (GLUCOPHAGE) 1000 MG tablet TAKE 1 TABLET BY MOUTH TWICE DAILY WITH FOOD FOR DIABETES 180 tablet 0   omeprazole (PRILOSEC) 20 MG capsule TAKE 1 CAPSULE BY MOUTH ONCE DAILY 90 capsule 0   OZEMPIC, 0.25 OR 0.5 MG/DOSE, 2 MG/1.5ML SOPN Inject 0.5 mg into the skin once a week. 4.5 mL 1   traZODone (DESYREL) 100 MG tablet Take 0.5-1 tablets (50-100 mg total) by mouth at bedtime as needed for sleep. 90 tablet 0   No current facility-administered medications for this visit.    Allergies  Allergen Reactions   Codeine     Other reaction(s): Vomiting    Family History  Problem Relation Age of Onset  Renal Disease Mother 23   Heart disease Father    Stroke Father    Alzheimer's disease Father    Stroke Brother    Diabetes Brother    Diabetes Maternal Grandfather     Social History   Socioeconomic History   Marital status: Divorced    Spouse name: Not on file   Number of children: 3   Years of education: Not on file   Highest education level: 10th grade  Occupational History   Occupation: retired  Tobacco Use   Smoking status: Former    Packs/day: 1.00    Years: 40.00    Pack years: 40.00    Types: Cigarettes    Quit date: 07/25/2018    Years since quitting: 2.7   Smokeless tobacco: Former  Scientific laboratory technician Use: Never used  Substance and Sexual Activity   Alcohol use: Not Currently    Comment:  quit 2003, drank about 5 beer per week prior   Drug use: Never   Sexual activity: Yes    Birth control/protection: None  Other Topics Concern   Not on file  Social History Narrative   Not on file   Social Determinants of Health   Financial Resource Strain: Low Risk    Difficulty of Paying Living Expenses: Not hard at all  Food Insecurity: No Food Insecurity   Worried About Charity fundraiser in the Last Year: Never true   [place] in the Last Year: Never true  Transportation Needs: No Transportation Needs   Lack of Transportation (Medical): No   Lack of Transportation (Non-Medical): No  Physical Activity: Inactive   Days of Exercise per Week: 0 days   Minutes of Exercise per Session: 0 min  Stress: No Stress Concern Present   Feeling of Stress : Not at all  Social Connections: Not on file  Intimate Partner Violence: Not on file     Constitutional: Denies fever, malaise, fatigue, headache or abrupt weight changes.  HEENT: Denies eye pain, eye redness, ear pain, ringing in the ears, wax buildup, runny nose, nasal congestion, bloody nose, or sore throat. Respiratory: Denies difficulty breathing, shortness of breath, cough or sputum production.   Cardiovascular: Denies chest pain, chest tightness, palpitations or swelling in the hands or feet.  Gastrointestinal: Pt reports decreased appetite. Denies abdominal pain, bloating, constipation, diarrhea or blood in the stool.  GU: Denies urgency, frequency, pain with urination, burning sensation, blood in urine, odor or discharge. Skin: Denies redness, rashes, lesions or ulcercations.  Neurological: Denies dizziness, difficulty with memory, difficulty with speech or problems with balance and coordination.  Psych: Pt has a history of anxiety. Denies depression, SI/HI.  No other specific complaints in a complete review of systems (except as listed in HPI above).  Objective:   Physical Exam BP 106/67 (BP Location: Right Arm,  Patient Position: Sitting, Cuff Size: Normal)   Pulse 85   Temp 97.7 F (36.5 C) (Temporal)   Resp 17   Ht '5\' 6"'  (1.676 m)   Wt 145 lb (65.8 kg)   SpO2 99%   BMI 23.40 kg/m   Wt Readings from Last 3 Encounters:  03/28/21 152 lb (68.9 kg)  01/21/21 152 lb 9.6 oz (69.2 kg)  12/11/20 153 lb (69.4 kg)    General: Appears his stated age, well developed, well nourished in NAD. Skin: Warm, dry and intact. No ulcerations noted. HEENT: Head: normal shape and size; Eyes: sclera white and EOMs intact;  Cardiovascular:  Normal rate and rhythm. S1,S2 noted.  No murmur, rubs or gallops noted. No JVD or BLE edema. No carotid bruits noted. Pulmonary/Chest: Normal effort and positive vesicular breath sounds. No respiratory distress. No wheezes, rales or ronchi noted.  Musculoskeletal: No difficulty with gait.  Neurological: Alert and oriented.   BMET    Component Value Date/Time   NA 139 01/21/2021 1451   NA 137 02/05/2013 2355   K 4.1 01/21/2021 1451   K 3.8 02/05/2013 2355   CL 104 01/21/2021 1451   CL 107 02/05/2013 2355   CO2 25 01/21/2021 1451   CO2 23 02/05/2013 2355   GLUCOSE 272 (H) 01/21/2021 1451   GLUCOSE 113 (H) 02/05/2013 2355   BUN 17 01/21/2021 1451   BUN 9 02/05/2013 2355   CREATININE 1.13 01/21/2021 1451   CALCIUM 9.0 01/21/2021 1451   CALCIUM 8.9 02/05/2013 2355   GFRNONAA 47 (L) 08/27/2020 1029   GFRNONAA 61 02/16/2020 0922   GFRAA 70 02/16/2020 0922    Lipid Panel     Component Value Date/Time   CHOL 110 01/21/2021 1451   TRIG 296 (H) 01/21/2021 1451   HDL 32 (L) 01/21/2021 1451   CHOLHDL 3.4 01/21/2021 1451   LDLCALC 47 01/21/2021 1451    CBC    Component Value Date/Time   WBC 7.9 08/27/2020 1029   RBC 3.14 (L) 08/27/2020 1029   HGB 9.0 (L) 08/27/2020 1029   HGB 13.0 02/05/2013 2355   HCT 25.6 (L) 08/27/2020 1029   HCT 36.7 (L) 02/05/2013 2355   PLT 229 08/27/2020 1029   PLT 106 (L) 02/05/2013 2355   MCV 81.5 08/27/2020 1029   MCV 93 02/05/2013  2355   MCH 28.7 08/27/2020 1029   MCHC 35.2 08/27/2020 1029   RDW 13.2 08/27/2020 1029   RDW 13.5 02/05/2013 2355   LYMPHSABS 1,864 02/16/2020 0922   LYMPHSABS 1.8 02/01/2013 1304   MONOABS 0.4 02/01/2013 1304   EOSABS 207 02/16/2020 0922   EOSABS 0.2 02/01/2013 1304   BASOSABS 30 02/16/2020 0922   BASOSABS 0.0 02/01/2013 1304    Hgb A1C Lab Results  Component Value Date   HGBA1C 7.4 (H) 01/21/2021            Assessment & Plan:     Webb Silversmith, NP This visit occurred during the SARS-CoV-2 public health emergency.  Safety protocols were in place, including screening questions prior to the visit, additional usage of staff PPE, and extensive cleaning of exam room while observing appropriate contact time as indicated for disinfecting solutions.

## 2021-04-24 ENCOUNTER — Other Ambulatory Visit: Payer: Self-pay

## 2021-04-24 LAB — BASIC METABOLIC PANEL WITH GFR
BUN: 17 mg/dL (ref 7–25)
CO2: 24 mmol/L (ref 20–32)
Calcium: 9.4 mg/dL (ref 8.6–10.3)
Chloride: 105 mmol/L (ref 98–110)
Creat: 1.11 mg/dL (ref 0.70–1.35)
Glucose, Bld: 95 mg/dL (ref 65–99)
Potassium: 4.3 mmol/L (ref 3.5–5.3)
Sodium: 139 mmol/L (ref 135–146)
eGFR: 73 mL/min/{1.73_m2} (ref >=60)

## 2021-04-24 LAB — HEMOGLOBIN A1C
Hgb A1c MFr Bld: 5.9 % of total Hgb — ABNORMAL HIGH (ref <5.7)
Mean Plasma Glucose: 123 mg/dL
eAG (mmol/L): 6.8 mmol/L

## 2021-04-24 LAB — LIPID PANEL
Cholesterol: 91 mg/dL (ref <200)
HDL: 33 mg/dL — ABNORMAL LOW (ref >=40)
LDL Cholesterol (Calc): 32 mg/dL (calc)
Non-HDL Cholesterol (Calc): 58 mg/dL (calc) (ref <130)
Total CHOL/HDL Ratio: 2.8 (calc) (ref <5.0)
Triglycerides: 188 mg/dL — ABNORMAL HIGH (ref <150)

## 2021-04-27 ENCOUNTER — Other Ambulatory Visit: Payer: Self-pay | Admitting: Internal Medicine

## 2021-04-28 NOTE — Telephone Encounter (Signed)
Requested Prescriptions  Pending Prescriptions Disp Refills  . ezetimibe (ZETIA) 10 MG tablet [Pharmacy Med Name: EZETIMIBE 10 MG TAB] 90 tablet 2    Sig: TAKE 1 TABLET BY MOUTH ONCE DAILY     Cardiovascular:  Antilipid - Sterol Transport Inhibitors Failed - 04/27/2021 11:14 AM      Failed - HDL in normal range and within 360 days    HDL  Date Value Ref Range Status  04/23/2021 33 (L) > OR = 40 mg/dL Final         Failed - Triglycerides in normal range and within 360 days    Triglycerides  Date Value Ref Range Status  04/23/2021 188 (H) <150 mg/dL Final         Passed - Total Cholesterol in normal range and within 360 days    Cholesterol  Date Value Ref Range Status  04/23/2021 91 <200 mg/dL Final         Passed - LDL in normal range and within 360 days    LDL Cholesterol (Calc)  Date Value Ref Range Status  04/23/2021 32 mg/dL (calc) Final    Comment:    Reference range: <100 . Desirable range <100 mg/dL for primary prevention;   <70 mg/dL for patients with CHD or diabetic patients  with > or = 2 CHD risk factors. [name] Kitchen LDL-C is now calculated using the Martin-Hopkins  calculation, which is a validated novel method providing  better accuracy than the Friedewald equation in the  estimation of LDL-C.  Cresenciano Genre et al. Annamaria Helling. 5170;017(49): 2061-2068  (http://education.[facility].com/faq/FAQ164)          Passed - Valid encounter within last 12 months    Recent Outpatient Visits          5 days ago Type 2 diabetes mellitus with other specified complication, without long-term current use of insulin [facility])   [facility] [place], Coralie Keens, NP   3 months ago Essential hypertension   [place], Coralie Keens, NP   6 months ago Type 2 diabetes mellitus with other specified complication, without long-term current use of insulin [facility])   [facility], Coralie Keens, NP   8 months ago Type 2 diabetes mellitus with other specified  complication, without long-term current use of insulin [facility])   [facility] Olin Hauser, DO   9 months ago Balance disorder   [place], DO      Future Appointments            In 5 months  [facility], [place]   In 6 months [place], Coralie Keens, NP [facility], [facility]

## 2021-05-14 DIAGNOSIS — C4442 Squamous cell carcinoma of skin of scalp and neck: Secondary | ICD-10-CM | POA: Diagnosis not present

## 2021-07-02 ENCOUNTER — Ambulatory Visit: Payer: Self-pay | Admitting: *Deleted

## 2021-07-02 ENCOUNTER — Emergency Department
Admission: EM | Admit: 2021-07-02 | Discharge: 2021-07-02 | Disposition: A | Discharge status: Home / Self Care | Payer: Medicare Other | Attending: Emergency Medicine | Admitting: Emergency Medicine

## 2021-07-02 ENCOUNTER — Emergency Department: Payer: Medicare Other

## 2021-07-02 ENCOUNTER — Other Ambulatory Visit: Payer: Self-pay

## 2021-07-02 DIAGNOSIS — R0600 Dyspnea, unspecified: Secondary | ICD-10-CM | POA: Diagnosis not present

## 2021-07-02 DIAGNOSIS — R0789 Other chest pain: Secondary | ICD-10-CM

## 2021-07-02 DIAGNOSIS — E1122 Type 2 diabetes mellitus with diabetic chronic kidney disease: Secondary | ICD-10-CM | POA: Insufficient documentation

## 2021-07-02 DIAGNOSIS — J449 Chronic obstructive pulmonary disease, unspecified: Secondary | ICD-10-CM | POA: Diagnosis not present

## 2021-07-02 DIAGNOSIS — N1831 Chronic kidney disease, stage 3a: Secondary | ICD-10-CM | POA: Diagnosis not present

## 2021-07-02 DIAGNOSIS — R0602 Shortness of breath: Secondary | ICD-10-CM | POA: Insufficient documentation

## 2021-07-02 DIAGNOSIS — R9431 Abnormal electrocardiogram [ECG] [EKG]: Secondary | ICD-10-CM | POA: Diagnosis not present

## 2021-07-02 DIAGNOSIS — R059 Cough, unspecified: Secondary | ICD-10-CM | POA: Diagnosis not present

## 2021-07-02 DIAGNOSIS — R079 Chest pain, unspecified: Secondary | ICD-10-CM | POA: Diagnosis not present

## 2021-07-02 DIAGNOSIS — R404 Transient alteration of awareness: Secondary | ICD-10-CM | POA: Diagnosis not present

## 2021-07-02 DIAGNOSIS — K8020 Calculus of gallbladder without cholecystitis without obstruction: Secondary | ICD-10-CM | POA: Diagnosis not present

## 2021-07-02 DIAGNOSIS — R110 Nausea: Secondary | ICD-10-CM | POA: Diagnosis not present

## 2021-07-02 DIAGNOSIS — I129 Hypertensive chronic kidney disease with stage 1 through stage 4 chronic kidney disease, or unspecified chronic kidney disease: Secondary | ICD-10-CM | POA: Diagnosis not present

## 2021-07-02 DIAGNOSIS — R42 Dizziness and giddiness: Secondary | ICD-10-CM | POA: Diagnosis not present

## 2021-07-02 DIAGNOSIS — R0781 Pleurodynia: Secondary | ICD-10-CM | POA: Insufficient documentation

## 2021-07-02 DIAGNOSIS — Z743 Need for continuous supervision: Secondary | ICD-10-CM | POA: Diagnosis not present

## 2021-07-02 DIAGNOSIS — R6889 Other general symptoms and signs: Secondary | ICD-10-CM | POA: Diagnosis not present

## 2021-07-02 LAB — CBC
HCT: 32.2 % — ABNORMAL LOW (ref 39.0–52.0)
Hemoglobin: 11.3 g/dL — ABNORMAL LOW (ref 13.0–17.0)
MCH: 30.6 pg (ref 26.0–34.0)
MCHC: 35.1 g/dL (ref 30.0–36.0)
MCV: 87.3 fL (ref 80.0–100.0)
Platelets: 185 10*3/uL (ref 150–400)
RBC: 3.69 MIL/uL — ABNORMAL LOW (ref 4.22–5.81)
RDW: 13 % (ref 11.5–15.5)
WBC: 6.5 10*3/uL (ref 4.0–10.5)
nRBC: 0 % (ref 0.0–0.2)

## 2021-07-02 LAB — BASIC METABOLIC PANEL
Anion gap: 10 (ref 5–15)
BUN: 22 mg/dL (ref 8–23)
CO2: 22 mmol/L (ref 22–32)
Calcium: 9.3 mg/dL (ref 8.9–10.3)
Chloride: 108 mmol/L (ref 98–111)
Creatinine, Ser: 1.29 mg/dL — ABNORMAL HIGH (ref 0.61–1.24)
GFR, Estimated: >60 mL/min (ref >60)
Glucose, Bld: 143 mg/dL — ABNORMAL HIGH (ref 70–99)
Potassium: 4.2 mmol/L (ref 3.5–5.1)
Sodium: 140 mmol/L (ref 135–145)

## 2021-07-02 LAB — TROPONIN I (HIGH SENSITIVITY)
Troponin I (High Sensitivity): 4 ng/L (ref <18)
Troponin I (High Sensitivity): 4 ng/L (ref <18)

## 2021-07-02 LAB — D-DIMER, QUANTITATIVE: D-Dimer, Quant: 1.2 ug/mL-FEU — ABNORMAL HIGH (ref 0.00–0.50)

## 2021-07-02 MED ORDER — IOHEXOL 350 MG/ML SOLN
75.0000 mL | Freq: Once | INTRAVENOUS | Status: AC | PRN
  Administered 2021-07-02: 75 mL via INTRAVENOUS

## 2021-07-02 NOTE — Telephone Encounter (Signed)
Tried calling; no answer.  PEC please advise pt below if he calls back.   Thanks,   -Mickel Baas

## 2021-07-02 NOTE — ED Notes (Signed)
Follow up pcp/cardiology for stress test

## 2021-07-02 NOTE — ED Provider Notes (Signed)
[facility] Provider Note    Event Date/Time   First MD Initiated Contact with Patient 07/02/21 1245     (approximate)   History   Chest Pain   HPI  [name] is a 66 y.o. male past medical history of COPD, hypertension and diabetes who presents with chest pain.  Patient tells me over the weekend he felt nauseous and had a syncopal episode.  He was initially sitting on the couch and then felt like he was going to get sick subsequently got up and then felt lightheaded and lost consciousness.  Initially when he got up he was not able to move.  He denies any preceding chest pain shortness of breath or palpitations at that time.  Then today around 11 PM while he was sitting on the couch she developed chest pain.  Described as sharp sensation with associated nausea but no diaphoresis, mild shortness of breath.  He received nitroglycerin with EMS and then by the time to the ED his pain has largely subsided although he does still have some ongoing pain with deep breathing.  Denies lower extremity edema.  No history of cardiac disease.  No history of PE.    Past Medical History:  Diagnosis Date   Anxiety    COPD (chronic obstructive pulmonary disease) ([place])    Diabetes mellitus without complication (HCC)    GERD (gastroesophageal reflux disease)    Hypertension    Insomnia    Personal history of tobacco use, presenting hazards to health 05/31/2015    Patient Active Problem List   Diagnosis Date Noted   Aortic atherosclerosis ([place]) 01/21/2021   Stage 3a chronic kidney disease ([place]) 01/21/2021   Absolute anemia 10/17/2020   Chronic obstructive pulmonary disease ([place]) 05/03/2020   Sleep apnea 12/13/2019   Anxiety 11/01/2019   Primary insomnia 07/27/2018   Type 2 diabetes mellitus with other specified complication ([place]) 11/16/7626   Gastroesophageal reflux disease 07/27/2018   Essential hypertension 07/27/2018   Hyperlipidemia 07/27/2018   Chronic  hepatitis C without hepatic coma ([place]) 06/08/2014     Physical Exam  Triage Vital Signs: ED Triage Vitals  Enc Vitals Group     BP 07/02/21 1246 104/70     Pulse Rate 07/02/21 1246 80     Resp 07/02/21 1246 18     Temp 07/02/21 1246 98.6 F (37 C)     Temp Source 07/02/21 1246 Oral     SpO2 07/02/21 1246 98 %     Weight 07/02/21 1239 140 lb (63.5 kg)     Height 07/02/21 1239 5\' 6"  (1.676 m)     Head Circumference --      Peak Flow --      Pain Score 07/02/21 1239 4     Pain Loc --      Pain Edu? --      Excl. in [place]? --     Most recent vital signs: Vitals:   07/02/21 1246 07/02/21 1522  BP: 104/70 110/74  Pulse: 80 85  Resp: 18 19  Temp: 98.6 F (37 C) 98.5 F (36.9 C)  SpO2: 98% 98%     General: Awake, no distress.  CV:  Good peripheral perfusion.  Resp:  Normal effort.  Abd:  No distention.  Neuro:             Awake, Alert, Oriented x 3  Other:     ED Results / Procedures / Treatments  Labs (all labs ordered are  listed, but only abnormal results are displayed) Labs Reviewed  BASIC METABOLIC PANEL - Abnormal; Notable for the following components:      Result Value   Glucose, Bld 143 (*)    Creatinine, Ser 1.29 (*)    All other components within normal limits  CBC - Abnormal; Notable for the following components:   RBC 3.69 (*)    Hemoglobin 11.3 (*)    HCT 32.2 (*)    All other components within normal limits  D-DIMER, QUANTITATIVE - Abnormal; Notable for the following components:   D-Dimer, Quant 1.20 (*)    All other components within normal limits  TROPONIN I (HIGH SENSITIVITY)  TROPONIN I (HIGH SENSITIVITY)     EKG  EKG interpretation performed by myself: NSR, nml axis, nml intervals, no acute ischemic changes    RADIOLOGY EKG interpretation performed by myself: NSR, nml axis, nml intervals, no acute ischemic changes    PROCEDURES:  Critical Care performed: No  .1-3 Lead EKG Interpretation Performed by: Rada Hay,  MD Authorized by: Rada Hay, MD     Interpretation: normal     Rhythm: sinus rhythm     Ectopy: none     Conduction: normal    The patient is on the cardiac monitor to evaluate for evidence of arrhythmia and/or significant heart rate changes.   MEDICATIONS ORDERED IN ED: Medications  iohexol (OMNIPAQUE) 350 MG/ML injection 75 mL (75 mLs Intravenous Contrast Given 07/02/21 1411)     IMPRESSION / MDM / ASSESSMENT AND PLAN / ED COURSE  I reviewed the triage vital signs and the nursing notes.                              Differential diagnosis includes, but is not limited to, ACS, aortic dissection, pulmonary embolism, pneumonia, pleurisy, MSK  Patient is a 66 year old male with several cardiac risk factors but no history of coronary disease who presents with chest pain.  Started around 11 AM today described as sharp with some associated dyspnea largely improved by the time my evaluation.  Initial EKG with some baseline artifact but no acute ischemic changes.  Patient does describe some ongoing light pleuritic discomfort no significant chest pain at this time.  All signs are reassuring.  His initial troponin is negative.  Patient also had a syncopal episode over the weekend.  Given the pleuritic pain with the syncope did obtain a D-dimer which was elevated so CTA which was negative for pulmonary embolism.  His first troponin is negative.  Does need to have a repeat troponin since symptoms started less than 3 hours from the time of lab draw.  At the time of signout he is pending his repeat troponin.  I did repeat the EKG which is more easily interpretable given no artifact and it is nonischemic without significant change from prior.  Clinical Course as of 07/02/21 1531  Tue Jul 02, 2021  1401 D-Dimer, Quant(!): 1.20 [KM]    Clinical Course User Index [KM] Rada Hay, MD     FINAL CLINICAL IMPRESSION(S) / ED DIAGNOSES   Final diagnoses:  Chest pain, unspecified type      Rx / DC Orders   ED Discharge Orders     None        Note:  This document was prepared using Dragon voice recognition software and may include unintentional dictation errors.   Rada Hay, MD 07/02/21 [phone]

## 2021-07-02 NOTE — Telephone Encounter (Signed)
° °  Chief Complaint: requesting appt . Passed out x 2 on Saturday after taking ozempic medicaiton Symptoms: none now headache  Frequency: Saturday x 2  Pertinent Negatives: Patient denies dizziness, severe headache, blurred vision, N/V.  Disposition: [] ED /[] Urgent Care (no appt availability in office) / [x] Appointment(In office/virtual)/ []  [place] [facility]/ [] Home Care/ [] Refused Recommended Disposition /[] [place] [facility]/ []  Follow-up with PCP Additional Notes:   Please advise if patient can wait to be seen tomorrow. Protocol to be seen in ED and patient would like to be seen by PCP since no sx since Saturday other than headache. Reports taking ozempic Saturday and noted feeling like vomiting and when stood up vomited and passed out in kitchen and again in living room 1 hour later. CBG checked and was 122. Fell and c/o hitting "tail bone". Can walk.       Reason for Disposition  [1] All other patients AND [2] now alert and feels fine  (Exception: SIMPLE FAINT due to stress, pain, prolonged standing, or suddenly standing)  Answer Assessment - Initial Assessment Questions 1. ONSET: "How long were you unconscious?" (minutes) "When did it happen?"      Approx. 30 minutes on Saturday 06/28/21 2. CONTENT: "What happened during period of unconsciousness?" (e.g., seizure activity)      Not sure  3. MENTAL STATUS: "Alert and oriented now?" (oriented x 3 = name, month, location)      Alert and oriented now 4. TRIGGER: "What do you think caused the fainting?" "What were you doing just before you fainted?"  (e.g., exercise, sudden standing up, prolonged standing)     Reports taking ozempic dose and passed out x 2 Saturday  5. RECURRENT SYMPTOM: "Have you ever passed out before?" If Yes, ask: "When was the last time?" and "What happened that time?"      No  6. INJURY: "Did you sustain any injury during the fall?"      "Hit tail bone" 7. CARDIAC SYMPTOMS: "Have you had any of the  following symptoms: chest pain, difficulty breathing, palpitations?"     No has had headaches.  8. NEUROLOGIC SYMPTOMS: "Have you had any of the following symptoms: headache, numbness, vertigo, weakness?"     Headache but no other symptoms now , pain in "tail bone" 9. GI SYMPTOMS: "Have you had any of the following symptoms: abdominal pain, vomiting, diarrhea, blood in stools?"     na 10. OTHER SYMPTOMS: "Do you have any other symptoms?"       Na 11. PREGNANCY: "Is there any chance you are pregnant?" "When was your last menstrual period?"       na  Protocols used: Fainting-A-AH

## 2021-07-02 NOTE — ED Triage Notes (Signed)
Pt comes into the ED via [facility] from home c/o central chest pain that is stabbing in nature and non-radiating.  Pt did admit to nausea and dizziness with it.  Pt had syncopal episodes on Saturday but didn't seek treatment.   153/103 324 ASA given 1 nitro spray 127/76 last BP checked 100% RA 90 HR 20 g Left forearm.

## 2021-07-02 NOTE — Telephone Encounter (Signed)
Okay to wait until tomorrow unless he has another syncopal episode would strongly encourage that he go to the ER

## 2021-07-03 ENCOUNTER — Ambulatory Visit (INDEPENDENT_AMBULATORY_CARE_PROVIDER_SITE_OTHER): Payer: Medicare Other | Admitting: Internal Medicine

## 2021-07-03 ENCOUNTER — Encounter: Payer: Self-pay | Admitting: Internal Medicine

## 2021-07-03 VITALS — BP 127/82 | HR 99 | Temp 96.9°F | Wt 141.0 lb

## 2021-07-03 DIAGNOSIS — H7291 Unspecified perforation of tympanic membrane, right ear: Secondary | ICD-10-CM | POA: Diagnosis not present

## 2021-07-03 DIAGNOSIS — H60332 Swimmer's ear, left ear: Secondary | ICD-10-CM

## 2021-07-03 DIAGNOSIS — H918X3 Other specified hearing loss, bilateral: Secondary | ICD-10-CM | POA: Diagnosis not present

## 2021-07-03 DIAGNOSIS — R55 Syncope and collapse: Secondary | ICD-10-CM | POA: Diagnosis not present

## 2021-07-03 DIAGNOSIS — R0789 Other chest pain: Secondary | ICD-10-CM

## 2021-07-03 DIAGNOSIS — F419 Anxiety disorder, unspecified: Secondary | ICD-10-CM | POA: Diagnosis not present

## 2021-07-03 MED ORDER — NEOMYCIN-POLYMYXIN-HC 3.5-10000-1 OT SUSP: 3.0000 [drp] | Freq: Four times a day (QID) | OTIC | 0 refills | Status: DC

## 2021-07-03 MED ORDER — BUSPIRONE HCL 5 MG PO TABS: 5.0000 mg | ORAL_TABLET | Freq: Two times a day (BID) | ORAL | 1 refills | Status: DC

## 2021-07-03 NOTE — Addendum Note (Signed)
Addended by: Jearld Fenton on: 07/03/2021 11:01 AM   Modules accepted: Orders

## 2021-07-03 NOTE — Progress Notes (Signed)
Subjective:    Patient ID: [name], male    DOB: 10-14-1955, 66 y.o.   MRN: [id number]  HPI  Patient presents to clinic today for ER follow-up.  He presented to the ER yesterday with complaint of chest pain and syncopal episodes.  He reports Friday, he felt nausea while sitting on the couch, stood up to walk to the kitchen and passed out. He did not hit his head but lost consciousness. He reports he laid there 15-20 minutes. He reports he went back to the living room and passed out again. He reports prior to passing out, he felt nauseous, dizzy, and sweaty but denied chest pain or shortness of breath. Labs showed a slightly elevated creatinine, anemia which had improved from his prior check and elevated D-dimer.  ECG showed NSR.  Chest x-ray was negative for any acute findings.  CT angio chest did not show any evidence of PE.  He was discharged and advised to follow-up with his PCP.  Since discharge, he reports he has not had any chest pain, dizziness, nausea or syncope. He thinks this occurred because of Ozempic. His last A1C was 5.9%, 03/2021.  He also reports he would like to be put back on his Buspirone for management of anxiety.  He also reports decreased hearing bilaterally. He thinks he needs hearing aids.  Review of Systems     Past Medical History:  Diagnosis Date   Anxiety    COPD (chronic obstructive pulmonary disease) ([place])    Diabetes mellitus without complication (HCC)    GERD (gastroesophageal reflux disease)    Hypertension    Insomnia    Personal history of tobacco use, presenting hazards to health 05/31/2015    Current Outpatient Medications  Medication Sig Dispense Refill   Accu-Chek FastClix Lancets MISC Use to check blood sugar up to twice a day 204 each 3   ACCU-CHEK GUIDE test strip Use to check blood sugar up to twice a day 200 each 3   albuterol (PROVENTIL) (2.5 MG/3ML) 0.083% nebulizer solution Take 3 mLs (2.5 mg total) by nebulization every 6 (six)  hours as needed for wheezing or shortness of breath. (Patient not taking: Reported on 04/23/2021) 75 mL 1   atorvastatin (LIPITOR) 40 MG tablet TAKE 1 TABLET BY MOUTH AT BEDTIME FOR HIGH CHOLESTEROL 90 tablet 0   Blood Glucose Monitoring Suppl (ACCU-CHEK GUIDE ME) w/Device KIT Use to check blood sugar up to 2 times daily 1 kit 0   busPIRone (BUSPAR) 5 MG tablet Take 5 mg by mouth 2 (two) times daily.     ezetimibe (ZETIA) 10 MG tablet TAKE 1 TABLET BY MOUTH ONCE DAILY 90 tablet 2   hydrochlorothiazide (MICROZIDE) 12.5 MG capsule TAKE 1 CAPSULE BY MOUTH ONCE DAILY HIGH BLOOD PRESSURE 90 capsule 0   Iron, Ferrous Sulfate, 325 (65 Fe) MG TABS Take 1 tablet by mouth daily. 30 tablet 0   lisinopril (ZESTRIL) 20 MG tablet TAKE 1 TABLET BY MOUTH ONCE DAILY HIGH BLOOD PRESSURE 90 tablet 0   metFORMIN (GLUCOPHAGE) 1000 MG tablet Take 1 tablet twice daily with food for Diabetes (Patient taking differently: Take 1,000 mg by mouth 2 (two) times daily with a meal. Take 1 tablet twice daily with food for Diabetes) 180 tablet 0   omeprazole (PRILOSEC) 20 MG capsule TAKE 1 CAPSULE BY MOUTH ONCE DAILY 90 capsule 0   OZEMPIC, 0.25 OR 0.5 MG/DOSE, 2 MG/1.5ML SOPN Inject 0.5 mg into the skin once a week. 4.5 mL  1   No current facility-administered medications for this visit.    Allergies  Allergen Reactions   Codeine     Other reaction(s): Vomiting    Family History  Problem Relation Age of Onset   Renal Disease Mother 52   Heart disease Father    Stroke Father    Alzheimer's disease Father    Stroke Brother    Diabetes Brother    Diabetes Maternal Grandfather     Social History   Socioeconomic History   Marital status: Divorced    Spouse name: Not on file   Number of children: 3   Years of education: Not on file   Highest education level: 10th grade  Occupational History   Occupation: retired  Tobacco Use   Smoking status: Former    Packs/day: 1.00    Years: 40.00    Pack years: 40.00     Types: Cigarettes    Quit date: 07/25/2018    Years since quitting: 2.9   Smokeless tobacco: Former  Scientific laboratory technician Use: Never used  Substance and Sexual Activity   Alcohol use: Not Currently    Comment: quit 2003, drank about 5 beer per week prior   Drug use: Never   Sexual activity: Yes    Birth control/protection: None  Other Topics Concern   Not on file  Social History Narrative   Not on file   Social Determinants of Health   Financial Resource Strain: Low Risk    Difficulty of Paying Living Expenses: Not hard at all  Food Insecurity: No Food Insecurity   Worried About Charity fundraiser in the Last Year: Never true   [place] in the Last Year: Never true  Transportation Needs: No Transportation Needs   Lack of Transportation (Medical): No   Lack of Transportation (Non-Medical): No  Physical Activity: Inactive   Days of Exercise per Week: 0 days   Minutes of Exercise per Session: 0 min  Stress: No Stress Concern Present   Feeling of Stress : Not at all  Social Connections: Not on file  Intimate Partner Violence: Not on file     Constitutional: Denies fever, malaise, fatigue, headache or abrupt weight changes.  HEENT: Pt reports decreased hearing. Denies eye pain, eye redness, ear pain, ringing in the ears, wax buildup, runny nose, nasal congestion, bloody nose, or sore throat. Respiratory: Denies difficulty breathing, shortness of breath, cough or sputum production.   Cardiovascular: Denies chest pain, chest tightness, palpitations or swelling in the hands or feet.  Gastrointestinal: Denies abdominal pain, bloating, constipation, diarrhea or blood in the stool.  Musculoskeletal: Denies decrease in range of motion, difficulty with gait, muscle pain or joint pain and swelling.  Neurological: Patient reports syncope.  Denies dizziness, difficulty with memory, difficulty with speech or problems with balance and coordination.  Psych: Pt reports anxiety. Denies  depression, SI/HI.  No other specific complaints in a complete review of systems (except as listed in HPI above).  Objective:   Physical Exam  BP 127/82 (BP Location: Right Arm, Patient Position: Sitting, Cuff Size: Normal)    Pulse 99    Temp (!) 96.9 F (36.1 C) (Temporal)    Wt 141 lb (64 kg)    SpO2 100%    BMI 22.76 kg/m   Wt Readings from Last 3 Encounters:  07/02/21 140 lb (63.5 kg)  04/23/21 145 lb (65.8 kg)  03/28/21 152 lb (68.9 kg)    General: Appears his  stated age, well developed, well nourished in NAD. Skin: Warm, dry and intact.  HEENT: Head: normal shape and size; Eyes: PERRLA and EOMs intact; Left  Ear: canal erythematous with pus noted in ear canal; Right Ear: Large hole noted in TM with visualization of the inner ear bones;  Cardiovascular: Normal rate and rhythm. S1,S2 noted.  No murmur, rubs or gallops noted. No JVD or BLE edema. No carotid bruits noted. Pulmonary/Chest: Normal effort and positive vesicular breath sounds. No respiratory distress. No wheezes, rales or ronchi noted.  Musculoskeletal: No difficulty with gait.  Neurological: Alert and oriented. Coordination normal.  Psychiatric: Anxious appearing. Judgment and thought content normal.    BMET    Component Value Date/Time   NA 140 07/02/2021 1241   NA 137 02/05/2013 2355   K 4.2 07/02/2021 1241   K 3.8 02/05/2013 2355   CL 108 07/02/2021 1241   CL 107 02/05/2013 2355   CO2 22 07/02/2021 1241   CO2 23 02/05/2013 2355   GLUCOSE 143 (H) 07/02/2021 1241   GLUCOSE 113 (H) 02/05/2013 2355   BUN 22 07/02/2021 1241   BUN 9 02/05/2013 2355   CREATININE 1.29 (H) 07/02/2021 1241   CREATININE 1.11 04/23/2021 1359   CALCIUM 9.3 07/02/2021 1241   CALCIUM 8.9 02/05/2013 2355   GFRNONAA >60 07/02/2021 1241   GFRNONAA 61 02/16/2020 0922   GFRAA 70 02/16/2020 0922    Lipid Panel     Component Value Date/Time   CHOL 91 04/23/2021 1359   TRIG 188 (H) 04/23/2021 1359   HDL 33 (L) 04/23/2021 1359    CHOLHDL 2.8 04/23/2021 1359   LDLCALC 32 04/23/2021 1359    CBC    Component Value Date/Time   WBC 6.5 07/02/2021 1241   RBC 3.69 (L) 07/02/2021 1241   HGB 11.3 (L) 07/02/2021 1241   HGB 13.0 02/05/2013 2355   HCT 32.2 (L) 07/02/2021 1241   HCT 36.7 (L) 02/05/2013 2355   PLT 185 07/02/2021 1241   PLT 106 (L) 02/05/2013 2355   MCV 87.3 07/02/2021 1241   MCV 93 02/05/2013 2355   MCH 30.6 07/02/2021 1241   MCHC 35.1 07/02/2021 1241   RDW 13.0 07/02/2021 1241   RDW 13.5 02/05/2013 2355   LYMPHSABS 1,864 02/16/2020 0922   LYMPHSABS 1.8 02/01/2013 1304   MONOABS 0.4 02/01/2013 1304   EOSABS 207 02/16/2020 0922   EOSABS 0.2 02/01/2013 1304   BASOSABS 30 02/16/2020 0922   BASOSABS 0.0 02/01/2013 1304    Hgb A1C Lab Results  Component Value Date   HGBA1C 5.9 (H) 04/23/2021            Assessment & Plan:   ER follow-up for Chest Pain and Syncope:  ER notes, labs and imaging reviewed Will d/c Ozempic Continue Metformin No recurrent symptoms, will monitor for now  Anxiety:  Buspirone 5 mg BID refilled  Ruptured TM, Right, Otitis Externa, Left, Bilateral Hearing Loss:  RX for Cortisporin 3 drops left ear x 7 days If no improvement in hearing, will perform Weber, Rinne and audiometric test at annual exaqm  RTC in 3 months for your annual exam   Return precautions discussed Webb Silversmith, NP This visit occurred during the SARS-CoV-2 public health emergency.  Safety protocols were in place, including screening questions prior to the visit, additional usage of staff PPE, and extensive cleaning of exam room while observing appropriate contact time as indicated for disinfecting solutions.

## 2021-07-03 NOTE — Patient Instructions (Signed)
Syncope, Adult Syncope is when you pass out or faint for a short time. It is caused by a sudden decrease in blood flow to the brain. This can happen for many reasons. It can sometimes happen when seeing blood, getting a shot (injection), or having pain or strong emotions. Most causes of fainting are not dangerous, but in some cases it can be a sign of a serious medical problem. If you faint, get help right away. Call your local emergency services (911 in the [place] Follow these instructions at home: Watch for any changes in your symptoms. Take these actions to stay safe and help with your symptoms: Knowing when you may be about to faint Signs that you may be about to faint include: Feeling dizzy or light-headed. It may feel like the room is spinning. Feeling weak. Feeling like you may vomit (nauseous). Seeing spots or seeing all white or all black. Having cold, clammy skin. Feeling warm and sweaty. Hearing ringing in the ears. If you start to feel like you might faint, sit or lie down right away. If sitting, lower your head down between your legs. If lying down, raise (elevate) your feet above the level of your heart. Breathe deeply and steadily. Wait until all of the symptoms are gone. Have someone stay with you until you feel better. Medicines Take over-the-counter and prescription medicines only as told by your doctor. If you are taking blood pressure or heart medicine, sit up and stand up slowly. Spend a few minutes getting ready to sit and then stand. This can help you feel less dizzy. Lifestyle Do not drive, use machinery, or play sports until your doctor says it is okay. Do not drink alcohol. Do not smoke or use any products that contain nicotine or tobacco. If you need help quitting, ask your doctor. Avoid hot tubs and saunas. General instructions Talk with your doctor about your symptoms. You may need to have testing to help find the cause. Drink enough fluid to keep your pee  (urine) pale yellow. Avoid standing for a long time. If you must stand for a long time, do movements such as: Moving your legs. Crossing your legs. Flexing and stretching your leg muscles. Squatting. Keep all follow-up visits. Contact a doctor if: You have episodes of near fainting. Get help right away if: You pass out or faint. You hit your head or are injured after fainting. You have any of these symptoms: Fast or uneven heartbeats (palpitations). Pain in your chest, belly, or back. Shortness of breath. You have jerky movements that you cannot control (seizure). You have a very bad headache. You are confused. You have problems with how you see (vision). You are very weak. You have trouble walking. You are bleeding from your mouth or your butt (rectum). You have black or tarry poop (stool). These symptoms may be an emergency. Get help right away. Call your local emergency services (911 in the [place] Do not wait to see if the symptoms will go away. Do not drive yourself to the hospital. Summary Syncope is when you pass out or faint for a short time. It is caused by a sudden decrease in blood flow to the brain. Signs that you may be about to faint include feeling dizzy or light-headed, feeling like you may vomit, seeing all white or all black, or having cold, clammy skin. If you start to feel like you might faint, sit or lie down right away. Lower your head if sitting, or raise (elevate) your  feet if lying down. Breathe deeply and steadily. Wait until all of the symptoms are gone. This information is not intended to replace advice given to you by your health care provider. Make sure you discuss any questions you have with your health care provider. Document Revised: 09/20/2020 Document Reviewed: 09/20/2020 Elsevier Patient Education  2022 [employer].

## 2021-07-22 ENCOUNTER — Other Ambulatory Visit: Payer: Self-pay | Admitting: Family Medicine

## 2021-07-22 ENCOUNTER — Other Ambulatory Visit: Payer: Self-pay | Admitting: Internal Medicine

## 2021-07-22 DIAGNOSIS — I10 Essential (primary) hypertension: Secondary | ICD-10-CM

## 2021-07-22 DIAGNOSIS — F5101 Primary insomnia: Secondary | ICD-10-CM

## 2021-07-22 DIAGNOSIS — K219 Gastro-esophageal reflux disease without esophagitis: Secondary | ICD-10-CM

## 2021-07-22 DIAGNOSIS — E1169 Type 2 diabetes mellitus with other specified complication: Secondary | ICD-10-CM

## 2021-07-22 DIAGNOSIS — E785 Hyperlipidemia, unspecified: Secondary | ICD-10-CM

## 2021-07-23 ENCOUNTER — Other Ambulatory Visit: Payer: Self-pay | Admitting: Internal Medicine

## 2021-07-23 NOTE — Telephone Encounter (Signed)
Requested medications are due for refill today.  no  Requested medications are on the active medications list.  no  Last refill. 09/09/2020 #90 1 refill  Future visit scheduled.   yes  Notes to clinic.  This was d/c'd 01/21/2021.    Requested Prescriptions  Pending Prescriptions Disp Refills   ULTRACARE PEN NEEDLES 32G X 5 MM MISC [Pharmacy Med Name: ULTRACARE PEN NEEDLES 32G X 5 MM] 90 each     Sig: USE AS DIRECTED. WITH OZEMPIC     Endocrinology: Diabetes - Testing Supplies Passed - 07/22/2021 10:21 AM      Passed - Valid encounter within last 12 months    Recent Outpatient Visits           2 weeks ago Other chest pain   [facility] [place], [place], NP   3 months ago Type 2 diabetes mellitus with other specified complication, without long-term current use of insulin [facility])   [facility], Coralie Keens, NP   6 months ago Essential hypertension   [place], [place], NP   9 months ago Type 2 diabetes mellitus with other specified complication, without long-term current use of insulin [facility])   [facility], Coralie Keens, NP   10 months ago Type 2 diabetes mellitus with other specified complication, without long-term current use of insulin ([place])   [facility] [place], Devonne Doughty, DO       Future Appointments             In 2 months  [facility], [place]   In 3 months [place], Coralie Keens, NP [facility], [facility]

## 2021-07-23 NOTE — Telephone Encounter (Signed)
Requested Prescriptions  Pending Prescriptions Disp Refills   metFORMIN (GLUCOPHAGE) 1000 MG tablet [Pharmacy Med Name: METFORMIN HCL 1000 MG TAB] 180 tablet 0    Sig: TAKE 1 TABLET BY MOUTH TWICE DAILY WITH FOOD FOR DIABETES     Endocrinology:  Diabetes - Biguanides Failed - 07/22/2021 10:20 AM      Failed - Cr in normal range and within 360 days    Creat  Date Value Ref Range Status  04/23/2021 1.11 0.70 - 1.35 mg/dL Final   Creatinine, Ser  Date Value Ref Range Status  07/02/2021 1.29 (H) 0.61 - 1.24 mg/dL Final         Failed - B12 Level in normal range and within 720 days    No results found for: VITAMINB12       Failed - CBC within normal limits and completed in the last 12 months    WBC  Date Value Ref Range Status  07/02/2021 6.5 4.0 - 10.5 K/uL Final   RBC  Date Value Ref Range Status  07/02/2021 3.69 (L) 4.22 - 5.81 MIL/uL Final   Hemoglobin  Date Value Ref Range Status  07/02/2021 11.3 (L) 13.0 - 17.0 g/dL Final   HGB  Date Value Ref Range Status  02/05/2013 13.0 13.0 - 18.0 g/dL Final   HCT  Date Value Ref Range Status  07/02/2021 32.2 (L) 39.0 - 52.0 % Final  02/05/2013 36.7 (L) 40.0 - 52.0 % Final   MCHC  Date Value Ref Range Status  07/02/2021 35.1 30.0 - 36.0 g/dL Final   [facility]  Date Value Ref Range Status  07/02/2021 30.6 26.0 - 34.0 pg Final   MCV  Date Value Ref Range Status  07/02/2021 87.3 80.0 - 100.0 fL Final  02/05/2013 93 80 - 100 fL Final   No results found for: PLTCOUNTKUC, LABPLAT, POCPLA RDW  Date Value Ref Range Status  07/02/2021 13.0 11.5 - 15.5 % Final  02/05/2013 13.5 11.5 - 14.5 % Final         Passed - HBA1C is between 0 and 7.9 and within 180 days    Hgb A1c MFr Bld  Date Value Ref Range Status  04/23/2021 5.9 (H) <5.7 % of total Hgb Final    Comment:    For someone without known diabetes, a hemoglobin  A1c value between 5.7% and 6.4% is consistent with prediabetes and should be confirmed with a  follow-up  test. . For someone with known diabetes, a value <7% indicates that their diabetes is well controlled. A1c targets should be individualized based on duration of diabetes, age, comorbid conditions, and other considerations. . This assay result is consistent with an increased risk of diabetes. . Currently, no consensus exists regarding use of hemoglobin A1c for diagnosis of diabetes for children. .          Passed - eGFR in normal range and within 360 days    GFR, Est African American  Date Value Ref Range Status  02/16/2020 70 > OR = 60 mL/min/1.61m Final   GFR, Est Non African American  Date Value Ref Range Status  02/16/2020 61 > OR = 60 mL/min/1.770mFinal   GFR, Estimated  Date Value Ref Range Status  07/02/2021 >60 >60 mL/min Final    Comment:    (NOTE) Calculated using the CKD-EPI Creatinine Equation (2021)    eGFR  Date Value Ref Range Status  04/23/2021 73 > OR = 60 mL/min/1.7368minal    Comment:  The eGFR is based on the CKD-EPI 2021 equation. To calculate  the new eGFR from a previous Creatinine or Cystatin C result, go to https://www.kidney.org/professionals/ kdoqi/[email or web address]          Passed - Valid encounter within last 6 months    Recent Outpatient Visits          2 weeks ago Other chest pain   [facility] [place], Coralie Keens, NP   3 months ago Type 2 diabetes mellitus with other specified complication, without long-term current use of insulin ([place])   [facility] [place], Coralie Keens, NP   6 months ago Essential hypertension   [place], Coralie Keens, NP   9 months ago Type 2 diabetes mellitus with other specified complication, without long-term current use of insulin ([place])   [facility], Coralie Keens, NP   10 months ago Type 2 diabetes mellitus with other specified complication, without long-term current use of insulin ([place])   [place], Devonne Doughty, DO      Future Appointments            In 2 months  [facility], [place]   In 3 months [place], Coralie Keens, NP [facility], [facility]            hydrochlorothiazide (MICROZIDE) 12.5 MG capsule [Pharmacy Med Name: HYDROCHLOROTHIAZIDE 12.5 MG CAP] 90 capsule 0    Sig: TAKE 1 CAPSULE BY MOUTH ONCE DAILY HIGH BLOOD PRESSURE     Cardiovascular: Diuretics - Thiazide Failed - 07/22/2021 10:20 AM      Failed - Cr in normal range and within 180 days    Creat  Date Value Ref Range Status  04/23/2021 1.11 0.70 - 1.35 mg/dL Final   Creatinine, Ser  Date Value Ref Range Status  07/02/2021 1.29 (H) 0.61 - 1.24 mg/dL Final         Passed - K in normal range and within 180 days    Potassium  Date Value Ref Range Status  07/02/2021 4.2 3.5 - 5.1 mmol/L Final  02/05/2013 3.8 3.5 - 5.1 mmol/L Final         Passed - Na in normal range and within 180 days    Sodium  Date Value Ref Range Status  07/02/2021 140 135 - 145 mmol/L Final  02/05/2013 137 136 - 145 mmol/L Final         Passed - Last BP in normal range    BP Readings from Last 1 Encounters:  07/03/21 127/82         Passed - Valid encounter within last 6 months    Recent Outpatient Visits          2 weeks ago Other chest pain   [facility] [place], Coralie Keens, NP   3 months ago Type 2 diabetes mellitus with other specified complication, without long-term current use of insulin ([place])   [facility], Coralie Keens, NP   6 months ago Essential hypertension   [place], Coralie Keens, NP   9 months ago Type 2 diabetes mellitus with other specified complication, without long-term current use of insulin [facility])   [facility] [place], [place] W, NP   10 months ago Type 2 diabetes mellitus with other specified complication, without long-term current use of insulin [facility])   [place], Devonne Doughty, DO  Future  Appointments   °        ° In 2 months  [facility], [facility]  ° In 3 months Baity, Regina W, NP [facility], [facility]  °  ° °  °  °  °• lisinopril (ZESTRIL) 20 MG tablet [Pharmacy Med Name: LISINOPRIL 20 MG TAB] 90 tablet 0  °  Sig: TAKE 1 TABLET BY MOUTH ONCE DAILY HIGH BLOOD PRESSURE  °  ° Cardiovascular:  ACE Inhibitors Failed - 07/22/2021 10:20 AM  °  °  Failed - Cr in normal range and within 180 days  °  Creat  °Date Value Ref Range Status  °04/23/2021 1.11 0.70 - 1.35 mg/dL Final  ° °Creatinine, Ser  °Date Value Ref Range Status  °07/02/2021 1.29 (H) 0.61 - 1.24 mg/dL Final  °   °  °  Passed - K in normal range and within 180 days  °  Potassium  °Date Value Ref Range Status  °07/02/2021 4.2 3.5 - 5.1 mmol/L Final  °02/05/2013 3.8 3.5 - 5.1 mmol/L Final  °   °  °  Passed - Patient is not pregnant  °  °  Passed - Last BP in normal range  °  BP Readings from Last 1 Encounters:  °07/03/21 127/82  °   °  °  Passed - Valid encounter within last 6 months  °  Recent Outpatient Visits   °      ° 2 weeks ago Other chest pain  ° [facility] Baity, Regina W, NP  ° 3 months ago Type 2 diabetes mellitus with other specified complication, without long-term current use of insulin (HCC)  ° [facility] Baity, Regina W, NP  ° 6 months ago Essential hypertension  ° [facility] Baity, Regina W, NP  ° 9 months ago Type 2 diabetes mellitus with other specified complication, without long-term current use of insulin (HCC)  ° [facility] Baity, Regina W, NP  ° 10 months ago Type 2 diabetes mellitus with other specified complication, without long-term current use of insulin (HCC)  ° [facility] Karamalegos, Alexander J, DO  °  °  °Future Appointments   °        ° In 2 months  [facility], [facility]  ° In 3 months Baity, Regina W, NP [facility], [facility]  °  ° °  °  °  °• atorvastatin (LIPITOR) 40 MG tablet [Pharmacy  Med Name: ATORVASTATIN CALCIUM 40 MG TAB] 90 tablet 0  °  Sig: TAKE 1 TABLET BY MOUTH AT BEDTIME FOR HIGH CHOLESTEROL  °  ° Cardiovascular:  Antilipid - Statins Failed - 07/22/2021 10:20 AM  °  °  Failed - Lipid Panel in normal range within the last 12 months  °  Cholesterol  °Date Value Ref Range Status  °04/23/2021 91 <200 mg/dL Final  ° °LDL Cholesterol (Calc)  °Date Value Ref Range Status  °04/23/2021 32 mg/dL (calc) Final  °  Comment:  °  Reference range: <100 °. °Desirable range <100 mg/dL for primary prevention;   °<70 mg/dL for patients with CHD or diabetic patients  °with > or = 2 CHD risk factors. °. °LDL-C is now calculated using the Martin-Hopkins  °calculation, which is a validated novel method providing  °better accuracy than the Friedewald equation in the  °estimation of LDL-C.  °Martin SS et al. JAMA. 2013;310(19):   2061-2068  °(http://education.[facility].com/faq/FAQ164) °  ° °HDL  °Date Value Ref Range Status  °04/23/2021 33 (L) > OR = 40 mg/dL Final  ° °Triglycerides  °Date Value Ref Range Status  °04/23/2021 188 (H) <150 mg/dL Final  ° °  °  °  Passed - Patient is not pregnant  °  °  Passed - Valid encounter within last 12 months  °  Recent Outpatient Visits   °      ° 2 weeks ago Other chest pain  ° [facility] Baity, Regina W, NP  ° 3 months ago Type 2 diabetes mellitus with other specified complication, without long-term current use of insulin (HCC)  ° [facility] Baity, Regina W, NP  ° 6 months ago Essential hypertension  ° [facility] Baity, Regina W, NP  ° 9 months ago Type 2 diabetes mellitus with other specified complication, without long-term current use of insulin (HCC)  ° [facility] Baity, Regina W, NP  ° 10 months ago Type 2 diabetes mellitus with other specified complication, without long-term current use of insulin (HCC)  ° [facility] Karamalegos, Alexander J, DO  °  °  °Future Appointments   °         ° In 2 months  [facility]  ° In 3 months Baity, Regina W, NP [facility]  °  ° °  °  °  °• omeprazole (PRILOSEC) 20 MG capsule [Pharmacy Med Name: OMEPRAZOLE DR 20 MG CAP] 90 capsule 0  °  Sig: TAKE 1 CAPSULE BY MOUTH ONCE DAILY  °  ° Gastroenterology: Proton Pump Inhibitors Passed - 07/22/2021 10:20 AM  °  °  Passed - Valid encounter within last 12 months  °  Recent Outpatient Visits   °      ° 2 weeks ago Other chest pain  ° [facility] Baity, Regina W, NP  ° 3 months ago Type 2 diabetes mellitus with other specified complication, without long-term current use of insulin (HCC)  ° [facility] Baity, Regina W, NP  ° 6 months ago Essential hypertension  ° [facility] Baity, Regina W, NP  ° 9 months ago Type 2 diabetes mellitus with other specified complication, without long-term current use of insulin (HCC)  ° [facility] Baity, Regina W, NP  ° 10 months ago Type 2 diabetes mellitus with other specified complication, without long-term current use of insulin (HCC)  ° [facility] Karamalegos, Alexander J, DO  °  °  °Future Appointments   °        ° In 2 months  [facility]  ° In 3 months Baity, Regina W, NP [facility]  °  ° °  °  °  °• traZODone (DESYREL) 100 MG tablet [Pharmacy Med Name: TRAZODONE HCL 100 MG TAB] 90 tablet 0  °  Sig: TAKE 1/2-1 TABLET BY MOUTH AT BEDTIME ASNEEDED FOR SLEEP  °  ° Psychiatry: Antidepressants - Serotonin Modulator Passed - 07/22/2021 10:20 AM  °  °  Passed - Valid encounter within last 6 months  °  Recent Outpatient Visits   °      ° 2 weeks ago Other chest pain  ° [facility] Baity, Regina W, NP  ° 3 months ago Type 2 diabetes mellitus with other specified   complication, without long-term current use of insulin (HCC)  ° [facility] Baity, Regina W, NP  ° 6 months ago Essential hypertension  °  [facility] Baity, Regina W, NP  ° 9 months ago Type 2 diabetes mellitus with other specified complication, without long-term current use of insulin (HCC)  ° [facility] Baity, Regina W, NP  ° 10 months ago Type 2 diabetes mellitus with other specified complication, without long-term current use of insulin (HCC)  ° [facility] Karamalegos, Alexander J, DO  °  °  °Future Appointments   °        ° In 2 months  [facility]  ° In 3 months Baity, Regina W, NP [facility]  °  ° °  °  °  °• OZEMPIC, 0.25 OR 0.5 MG/DOSE, 2 MG/1.5ML SOPN [Pharmacy Med Name: OZEMPIC (0.25 OR 0.5 MG/DOSE) 2 MG/] 4.5 mL 1  °  Sig: INJECT 0.5MG INTO THE SKIN ONCE A WEEK  °  ° Endocrinology:  Diabetes - GLP-1 Receptor Agonists - semaglutide Failed - 07/22/2021 10:20 AM  °  °  Failed - HBA1C in normal range and within 180 days  °  Hgb A1c MFr Bld  °Date Value Ref Range Status  °04/23/2021 5.9 (H) <5.7 % of total Hgb Final  °  Comment:  °  For someone without known diabetes, a hemoglobin  °A1c value between 5.7% and 6.4% is consistent with °prediabetes and should be confirmed with a  °follow-up test. °. °For someone with known diabetes, a value <7% °indicates that their diabetes is well controlled. A1c °targets should be individualized based on duration of °diabetes, age, comorbid conditions, and other °considerations. °. °This assay result is consistent with an increased risk °of diabetes. °. °Currently, no consensus exists regarding use of °hemoglobin A1c for diagnosis of diabetes for children. °. °  °   °  °  Failed - Cr in normal range and within 360 days  °  Creat  °Date Value Ref Range Status  °04/23/2021 1.11 0.70 - 1.35 mg/dL Final  ° °Creatinine, Ser  °Date Value Ref Range Status  °07/02/2021 1.29 (H) 0.61 - 1.24 mg/dL Final  °   °  °  Passed - Valid encounter within last 6 months  °  Recent Outpatient Visits   °      ° 2 weeks ago Other chest pain  °  [facility] Baity, Regina W, NP  ° 3 months ago Type 2 diabetes mellitus with other specified complication, without long-term current use of insulin (HCC)  ° [facility] Baity, Regina W, NP  ° 6 months ago Essential hypertension  ° [facility] Baity, Regina W, NP  ° 9 months ago Type 2 diabetes mellitus with other specified complication, without long-term current use of insulin (HCC)  ° [facility] Baity, Regina W, NP  ° 10 months ago Type 2 diabetes mellitus with other specified complication, without long-term current use of insulin (HCC)  ° [facility] Karamalegos, Alexander J, DO  °  °  °Future Appointments   °        ° In 2 months  [facility]  ° In 3 months Baity, Regina W, NP [facility]  °  ° °  °  °  ° ° °

## 2021-07-23 NOTE — Telephone Encounter (Signed)
Requested medication (s) are due for refill today: yes  Requested medication (s) are on the active medication list: no  Last refill:  04/16/21  Future visit scheduled: yes  Notes to clinic:  both medications were dc'd. Please advise     Requested Prescriptions  Pending Prescriptions Disp Refills   traZODone (DESYREL) 100 MG tablet [Pharmacy Med Name: TRAZODONE HCL 100 MG TAB] 90 tablet 0    Sig: TAKE 1/2-1 TABLET BY MOUTH AT BEDTIME ASNEEDED FOR SLEEP     Psychiatry: Antidepressants - Serotonin Modulator Passed - 07/22/2021 10:20 AM      Passed - Valid encounter within last 6 months    Recent Outpatient Visits           2 weeks ago Other chest pain   [facility] [place], Coralie Keens, NP   3 months ago Type 2 diabetes mellitus with other specified complication, without long-term current use of insulin ([place])   [facility] [place], Coralie Keens, NP   6 months ago Essential hypertension   [place], Coralie Keens, NP   9 months ago Type 2 diabetes mellitus with other specified complication, without long-term current use of insulin ([place])   [facility] [place], Coralie Keens, NP   10 months ago Type 2 diabetes mellitus with other specified complication, without long-term current use of insulin ([place])   [facility] [place], Devonne Doughty, DO       Future Appointments             In 2 months  [facility], [place]   In 3 months [place], Coralie Keens, NP [facility], [place], 0.25 OR 0.5 MG/DOSE, 2 MG/1.5ML SOPN [Pharmacy Med Name: OZEMPIC (0.25 OR 0.5 MG/DOSE) 2 MG/] 4.5 mL 1    Sig: INJECT 0.5MG INTO THE SKIN ONCE A WEEK     Endocrinology:  Diabetes - GLP-1 Receptor Agonists - semaglutide Failed - 07/22/2021 10:20 AM      Failed - HBA1C in normal range and within 180 days    Hgb A1c MFr Bld  Date Value Ref Range Status  04/23/2021 5.9 (H) <5.7 % of total Hgb Final     Comment:    For someone without known diabetes, a hemoglobin  A1c value between 5.7% and 6.4% is consistent with prediabetes and should be confirmed with a  follow-up test. . For someone with known diabetes, a value <7% indicates that their diabetes is well controlled. A1c targets should be individualized based on duration of diabetes, age, comorbid conditions, and other considerations. . This assay result is consistent with an increased risk of diabetes. . Currently, no consensus exists regarding use of hemoglobin A1c for diagnosis of diabetes for children. .           Failed - Cr in normal range and within 360 days    Creat  Date Value Ref Range Status  04/23/2021 1.11 0.70 - 1.35 mg/dL Final   Creatinine, Ser  Date Value Ref Range Status  07/02/2021 1.29 (H) 0.61 - 1.24 mg/dL Final          Passed - Valid encounter within last 6 months    Recent Outpatient Visits           2 weeks ago Other chest pain   [place], NP   3 months  ago Type 2 diabetes mellitus with other specified complication, without long-term current use of insulin ([place])   [facility] [place], Coralie Keens, NP   6 months ago Essential hypertension   [facility] [place], Coralie Keens, NP   9 months ago Type 2 diabetes mellitus with other specified complication, without long-term current use of insulin ([place])   [facility] [place], Coralie Keens, NP   10 months ago Type 2 diabetes mellitus with other specified complication, without long-term current use of insulin ([place])   [facility] [place], Devonne Doughty, DO       Future Appointments             In 2 months  [facility], [place]   In 3 months [place], Coralie Keens, NP [facility], [facility]            Signed Prescriptions Disp Refills   metFORMIN (GLUCOPHAGE) 1000 MG tablet 180 tablet 0    Sig: TAKE 1 TABLET BY MOUTH TWICE DAILY WITH FOOD  FOR DIABETES     Endocrinology:  Diabetes - Biguanides Failed - 07/22/2021 10:20 AM      Failed - Cr in normal range and within 360 days    Creat  Date Value Ref Range Status  04/23/2021 1.11 0.70 - 1.35 mg/dL Final   Creatinine, Ser  Date Value Ref Range Status  07/02/2021 1.29 (H) 0.61 - 1.24 mg/dL Final          Failed - B12 Level in normal range and within 720 days    No results found for: VITAMINB12        Failed - CBC within normal limits and completed in the last 12 months    WBC  Date Value Ref Range Status  07/02/2021 6.5 4.0 - 10.5 K/uL Final   RBC  Date Value Ref Range Status  07/02/2021 3.69 (L) 4.22 - 5.81 MIL/uL Final   Hemoglobin  Date Value Ref Range Status  07/02/2021 11.3 (L) 13.0 - 17.0 g/dL Final   HGB  Date Value Ref Range Status  02/05/2013 13.0 13.0 - 18.0 g/dL Final   HCT  Date Value Ref Range Status  07/02/2021 32.2 (L) 39.0 - 52.0 % Final  02/05/2013 36.7 (L) 40.0 - 52.0 % Final   MCHC  Date Value Ref Range Status  07/02/2021 35.1 30.0 - 36.0 g/dL Final   [facility]  Date Value Ref Range Status  07/02/2021 30.6 26.0 - 34.0 pg Final   MCV  Date Value Ref Range Status  07/02/2021 87.3 80.0 - 100.0 fL Final  02/05/2013 93 80 - 100 fL Final   No results found for: PLTCOUNTKUC, LABPLAT, POCPLA RDW  Date Value Ref Range Status  07/02/2021 13.0 11.5 - 15.5 % Final  02/05/2013 13.5 11.5 - 14.5 % Final         Passed - HBA1C is between 0 and 7.9 and within 180 days    Hgb A1c MFr Bld  Date Value Ref Range Status  04/23/2021 5.9 (H) <5.7 % of total Hgb Final    Comment:    For someone without known diabetes, a hemoglobin  A1c value between 5.7% and 6.4% is consistent with prediabetes and should be confirmed with a  follow-up test. . For someone with known diabetes, a value <7% indicates that their diabetes is well controlled. A1c targets should be individualized based on duration of diabetes, age, comorbid conditions, and  other considerations. [name] Kitchen  This assay result is consistent with an increased risk of diabetes. . Currently, no consensus exists regarding use of hemoglobin A1c for diagnosis of diabetes for children. .           Passed - eGFR in normal range and within 360 days    GFR, Est African American  Date Value Ref Range Status  02/16/2020 70 > OR = 60 mL/min/1.65m Final   GFR, Est Non African American  Date Value Ref Range Status  02/16/2020 61 > OR = 60 mL/min/1.744mFinal   GFR, Estimated  Date Value Ref Range Status  07/02/2021 >60 >60 mL/min Final    Comment:    (NOTE) Calculated using the CKD-EPI Creatinine Equation (2021)    eGFR  Date Value Ref Range Status  04/23/2021 73 > OR = 60 mL/min/1.7348minal    Comment:    The eGFR is based on the CKD-EPI 2021 equation. To calculate  the new eGFR from a previous Creatinine or Cystatin C result, go to https://www.kidney.org/professionals/ kdoqi/gfr%5Fcalculator           Passed - Valid encounter within last 6 months    Recent Outpatient Visits           2 weeks ago Other chest pain   [facility]   3 months ago Type 2 diabetes mellitus with other specified complication, without long-term current use of insulin ([place] [facility]   6 months ago Essential hypertension   [place]   9 months ago Type 2 diabetes mellitus with other specified complication, without long-term current use of insulin ([place]CC[place] [facility]   10 months ago Type 2 diabetes mellitus with other specified complication, without long-term current use of insulin (HCCMarceline Sou[place]O       Future Appointments             In 2 months  [facility] [place]              hydrochlorothiazide (MICROZIDE) 12.5 MG capsule 90 capsule 0    Sig: TAKE 1 CAPSULE BY MOUTH ONCE DAILY HIGH BLOOD PRESSURE     Cardiovascular: Diuretics - Thiazide Failed - 07/22/2021 10:20 AM      Failed - Cr in normal range and within 180 days    Creat  Date Value Ref Range Status  04/23/2021 1.11 0.70 - 1.35 mg/dL Final   Creatinine, Ser  Date Value Ref Range Status  07/02/2021 1.29 (H) 0.61 - 1.24 mg/dL Final          Passed - K in normal range and within 180 days    Potassium  Date Value Ref Range Status  07/02/2021 4.2 3.5 - 5.1 mmol/L Final  02/05/2013 3.8 3.5 - 5.1 mmol/L Final          Passed - Na in normal range and within 180 days    Sodium  Date Value Ref Range Status  07/02/2021 140 135 - 145 mmol/L Final  02/05/2013 137 136 - 145 mmol/L Final          Passed - Last BP in normal range    BP Readings from Last 1 Encounters:  07/03/21 127/82  Passed - Valid encounter within last 6 months    Recent Outpatient Visits           2 weeks ago Other chest pain   [facility] [place], Coralie Keens, NP   3 months ago Type 2 diabetes mellitus with other specified complication, without long-term current use of insulin ([place])   [facility] [place], Coralie Keens, NP   6 months ago Essential hypertension   [place], Coralie Keens, NP   9 months ago Type 2 diabetes mellitus with other specified complication, without long-term current use of insulin ([place])   [facility] [place], Coralie Keens, NP   10 months ago Type 2 diabetes mellitus with other specified complication, without long-term current use of insulin ([place])   [facility] [place], Devonne Doughty, DO       Future Appointments             In 2 months  [facility], [place]   In 3 months [place], Coralie Keens, NP [facility], [facility]             lisinopril (ZESTRIL) 20 MG tablet 90 tablet 0    Sig: TAKE 1 TABLET  BY MOUTH ONCE DAILY HIGH BLOOD PRESSURE     Cardiovascular:  ACE Inhibitors Failed - 07/22/2021 10:20 AM      Failed - Cr in normal range and within 180 days    Creat  Date Value Ref Range Status  04/23/2021 1.11 0.70 - 1.35 mg/dL Final   Creatinine, Ser  Date Value Ref Range Status  07/02/2021 1.29 (H) 0.61 - 1.24 mg/dL Final          Passed - K in normal range and within 180 days    Potassium  Date Value Ref Range Status  07/02/2021 4.2 3.5 - 5.1 mmol/L Final  02/05/2013 3.8 3.5 - 5.1 mmol/L Final          Passed - Patient is not pregnant      Passed - Last BP in normal range    BP Readings from Last 1 Encounters:  07/03/21 127/82          Passed - Valid encounter within last 6 months    Recent Outpatient Visits           2 weeks ago Other chest pain   [facility] [place], Coralie Keens, NP   3 months ago Type 2 diabetes mellitus with other specified complication, without long-term current use of insulin ([place])   [facility], Coralie Keens, NP   6 months ago Essential hypertension   [place], [place] W, NP   9 months ago Type 2 diabetes mellitus with other specified complication, without long-term current use of insulin [facility])   [facility] [place], [place] W, NP   10 months ago Type 2 diabetes mellitus with other specified complication, without long-term current use of insulin ([place])   [place], DO       Future Appointments             In 2 months  [facility], [place]   In 3 months [place], Coralie Keens, NP [facility]             atorvastatin (LIPITOR) 40 MG tablet 90 tablet 0    Sig: TAKE 1 TABLET  BY MOUTH AT BEDTIME FOR HIGH CHOLESTEROL     Cardiovascular:  Antilipid - Statins Failed - 07/22/2021 10:20 AM      Failed - Lipid Panel in normal range within the last 12 months    Cholesterol  Date Value Ref Range Status   04/23/2021 91 <200 mg/dL Final   LDL Cholesterol (Calc)  Date Value Ref Range Status  04/23/2021 32 mg/dL (calc) Final    Comment:    Reference range: <100 . Desirable range <100 mg/dL for primary prevention;   <70 mg/dL for patients with CHD or diabetic patients  with > or = 2 CHD risk factors. [name] Kitchen LDL-C is now calculated using the Martin-Hopkins  calculation, which is a validated novel method providing  better accuracy than the Friedewald equation in the  estimation of LDL-C.  Cresenciano Genre et al. Annamaria Helling. 6060;045(99): 2061-2068  (http://education.Quest[employer].com/faq/FAQ164)    HDL  Date Value Ref Range Status  04/23/2021 33 (L) > OR = 40 mg/dL Final   Triglycerides  Date Value Ref Range Status  04/23/2021 188 (H) <150 mg/dL Final         Passed - Patient is not pregnant      Passed - Valid encounter within last 12 months    Recent Outpatient Visits           2 weeks ago Other chest pain   [facility] [place], [place] W, NP   3 months ago Type 2 diabetes mellitus with other specified complication, without long-term current use of insulin ([place])   [facility], Coralie Keens, NP   6 months ago Essential hypertension   [place], [place] W, NP   9 months ago Type 2 diabetes mellitus with other specified complication, without long-term current use of insulin ([place])   [facility] [place], Coralie Keens, NP   10 months ago Type 2 diabetes mellitus with other specified complication, without long-term current use of insulin ([place])   [facility] [place], Devonne Doughty, DO       Future Appointments             In 2 months  [facility], [place]   In 3 months [place], Coralie Keens, NP [facility], [facility]             omeprazole (PRILOSEC) 20 MG capsule 90 capsule 0    Sig: TAKE 1 CAPSULE BY MOUTH ONCE DAILY     Gastroenterology: Proton Pump Inhibitors Passed - 07/22/2021  10:20 AM      Passed - Valid encounter within last 12 months    Recent Outpatient Visits           2 weeks ago Other chest pain   [facility] [place], [place] W, NP   3 months ago Type 2 diabetes mellitus with other specified complication, without long-term current use of insulin [facility])   [facility], Coralie Keens, NP   6 months ago Essential hypertension   [facility] [place], [place] W, NP   9 months ago Type 2 diabetes mellitus with other specified complication, without long-term current use of insulin [facility])   [facility] [place], Coralie Keens, NP   10 months ago Type 2 diabetes mellitus with other specified complication, without long-term current use of insulin [facility])   [place], Devonne Doughty, DO       Future Appointments  In 2 months  [facility], [place]   In 3 months [place], NP [facility], [place]

## 2021-07-24 NOTE — Telephone Encounter (Signed)
Duplicate request: Request refused by office yesterday- no longer current medication ?Requested Prescriptions  ?Pending Prescriptions Disp Refills  ?? OZEMPIC, 0.25 OR 0.5 MG/DOSE, 2 MG/1.5ML SOPN [Pharmacy Med Name: OZEMPIC (0.25 OR 0.5 MG/DOSE) 2 MG/] 4.5 mL 1  ?  Sig: INJECT 0.5MG  INTO THE SKIN ONCE A WEEK  ?  ? Endocrinology:  Diabetes - GLP-1 Receptor Agonists - semaglutide Failed - 07/23/2021  3:30 PM  ?  ?  Failed - HBA1C in normal range and within 180 days  ?  Hgb A1c MFr Bld  ?Date Value Ref Range Status  ?04/23/2021 5.9 (H) <5.7 % of total Hgb Final  ?  Comment:  ?  For someone without known diabetes, a hemoglobin  ?A1c value between 5.7% and 6.4% is consistent with ?prediabetes and should be confirmed with a  ?follow-up test. ?. ?For someone with known diabetes, a value <7% ?indicates that their diabetes is well controlled. A1c ?targets should be individualized based on duration of ?diabetes, age, comorbid conditions, and other ?considerations. ?. ?This assay result is consistent with an increased risk ?of diabetes. ?. ?Currently, no consensus exists regarding use of ?hemoglobin A1c for diagnosis of diabetes for children. ?. ?  ?   ?  ?  Failed - Cr in normal range and within 360 days  ?  Creat  ?Date Value Ref Range Status  ?04/23/2021 1.11 0.70 - 1.35 mg/dL Final  ? ?Creatinine, Ser  ?Date Value Ref Range Status  ?07/02/2021 1.29 (H) 0.61 - 1.24 mg/dL Final  ?   ?  ?  Passed - Valid encounter within last 6 months  ?  Recent Outpatient Visits   ?      ? 3 weeks ago Other chest pain  ? [facility] [place], [place] W, NP  ? 3 months ago Type 2 diabetes mellitus with other specified complication, without long-term current use of insulin ([place])  ? [facility] [place], [place] W, NP  ? 6 months ago Essential hypertension  ? [facility] [place], [place] W, NP  ? 9 months ago Type 2 diabetes mellitus with other specified complication, without long-term current use of insulin  ([place])  ? [facility] [place], [place] W, NP  ? 10 months ago Type 2 diabetes mellitus with other specified complication, without long-term current use of insulin ([place])  ? [place], DO  ?  ?  ?Future Appointments   ?        ? In 2 months  [facility], [place]  ? In 3 months [place], Coralie Keens, NP [facility], [place]  ?  ? ?  ?  ?  ? ?

## 2021-10-11 ENCOUNTER — Ambulatory Visit (INDEPENDENT_AMBULATORY_CARE_PROVIDER_SITE_OTHER): Payer: Medicare Other

## 2021-10-11 DIAGNOSIS — Z0000 Encounter for general adult medical examination without abnormal findings: Secondary | ICD-10-CM

## 2021-10-11 NOTE — Progress Notes (Signed)
Subjective:   [name] is a 66 y.o. male who presents for Medicare Annual/Subsequent preventive examination.  Virtual Visit via Telephone Note  I connected with  [name] on 10/11/21 at 11:30 AM EDT by telephone and verified that I am speaking with the correct person using two identifiers.  Location: Patient: home Provider: [facility] Persons participating in the virtual visit: [place]   I discussed the limitations, risks, security and privacy concerns of performing an evaluation and management service by telephone and the availability of in person appointments. The patient expressed understanding and agreed to proceed.  Interactive audio and video telecommunications were attempted between this nurse and patient, however failed, due to patient having technical difficulties OR patient did not have access to video capability.  We continued and completed visit with audio only.  Some vital signs may be absent or patient reported.   Clemetine Marker, LPN   Review of Systems     Cardiac Risk Factors include: advanced age (>51mn, >>51women);diabetes mellitus;dyslipidemia;hypertension;male gender     Objective:    There were no vitals filed for this visit. There is no height or weight on file to calculate BMI.     10/11/2021   10:02 AM 07/02/2021   12:40 PM 10/09/2020   10:41 AM 08/27/2020   10:28 AM 10/20/2019   10:33 AM 10/19/2019   12:24 PM  Advanced Directives  Does Patient Have a Medical Advance Directive? No No No No No No  Would patient like information on creating a medical advance directive? No - Patient declined No - Patient declined   No - Patient declined     Current Medications (verified) Outpatient Encounter Medications as of 10/11/2021  Medication Sig   Accu-Chek FastClix Lancets MISC Use to check blood sugar up to twice a day   ACCU-CHEK GUIDE test strip Use to check blood sugar up to twice a day   albuterol (PROVENTIL) (2.5 MG/3ML)  0.083% nebulizer solution Take 3 mLs (2.5 mg total) by nebulization every 6 (six) hours as needed for wheezing or shortness of breath.   atorvastatin (LIPITOR) 40 MG tablet TAKE 1 TABLET BY MOUTH AT BEDTIME FOR HIGH CHOLESTEROL   Blood Glucose Monitoring Suppl (ACCU-CHEK GUIDE ME) w/Device KIT Use to check blood sugar up to 2 times daily   ezetimibe (ZETIA) 10 MG tablet TAKE 1 TABLET BY MOUTH ONCE DAILY   hydrochlorothiazide (MICROZIDE) 12.5 MG capsule TAKE 1 CAPSULE BY MOUTH ONCE DAILY HIGH BLOOD PRESSURE   lisinopril (ZESTRIL) 20 MG tablet TAKE 1 TABLET BY MOUTH ONCE DAILY HIGH BLOOD PRESSURE   metFORMIN (GLUCOPHAGE) 1000 MG tablet TAKE 1 TABLET BY MOUTH TWICE DAILY WITH FOOD FOR DIABETES   omeprazole (PRILOSEC) 20 MG capsule TAKE 1 CAPSULE BY MOUTH ONCE DAILY   ULTRACARE PEN NEEDLES 32G X 5 MM MISC USE AS DIRECTED. WITH OZEMPIC   busPIRone (BUSPAR) 5 MG tablet Take 1 tablet (5 mg total) by mouth 2 (two) times daily. (Patient not taking: Reported on 10/11/2021)   [DISCONTINUED] Iron, Ferrous Sulfate, 325 (65 Fe) MG TABS Take 1 tablet by mouth daily.   [DISCONTINUED] neomycin-polymyxin-hydrocortisone (CORTISPORIN) 3.5-10000-1 OTIC suspension Place 3 drops into the left ear 4 (four) times daily. X 7 days   No facility-administered encounter medications on file as of 10/11/2021.    Allergies (verified) Codeine   History: Past Medical History:  Diagnosis Date   Anxiety    COPD (chronic obstructive pulmonary disease) (HCC)    Diabetes mellitus without  complication ([place])    GERD (gastroesophageal reflux disease)    Hypertension    Insomnia    Personal history of tobacco use, presenting hazards to health 05/31/2015   Past Surgical History:  Procedure Laterality Date   COLONOSCOPY WITH PROPOFOL N/A 10/20/2019   Procedure: COLONOSCOPY WITH PROPOFOL;  Surgeon: Jonathon Bellows, MD;  Location: [facility] ENDOSCOPY;  Service: Gastroenterology;  Laterality: N/A;   HERNIA REPAIR Left 2003   SKIN CANCER  EXCISION  09/23/2020   had skin graft removed    Family History  Problem Relation Age of Onset   Renal Disease Mother 76   Heart disease Father    Stroke Father    Alzheimer's disease Father    Stroke Brother    Diabetes Brother    Diabetes Maternal Grandfather    Social History   Socioeconomic History   Marital status: Divorced    Spouse name: Not on file   Number of children: 3   Years of education: Not on file   Highest education level: 10th grade  Occupational History   Occupation: retired  Tobacco Use   Smoking status: Former    Packs/day: 1.00    Years: 40.00    Pack years: 40.00    Types: Cigarettes    Quit date: 07/25/2018    Years since quitting: 3.2   Smokeless tobacco: Former  Scientific laboratory technician Use: Never used  Substance and Sexual Activity   Alcohol use: Not Currently    Comment: quit 2003, drank about 5 beer per week prior   Drug use: Never   Sexual activity: Yes    Birth control/protection: None  Other Topics Concern   Not on file  Social History Narrative   Not on file   Social Determinants of Health   Financial Resource Strain: Low Risk    Difficulty of Paying Living Expenses: Not hard at all  Food Insecurity: No Food Insecurity   Worried About Charity fundraiser in the Last Year: Never true   [place] in the Last Year: Never true  Transportation Needs: No Transportation Needs   Lack of Transportation (Medical): No   Lack of Transportation (Non-Medical): No  Physical Activity: Inactive   Days of Exercise per Week: 0 days   Minutes of Exercise per Session: 0 min  Stress: No Stress Concern Present   Feeling of Stress : Not at all  Social Connections: Socially Isolated   Frequency of Communication with Friends and Family: More than three times a week   Frequency of Social Gatherings with Friends and Family: Three times a week   Attends Religious Services: Never   Active Member of Clubs or Organizations: No   Attends Programme researcher, broadcasting/film/video: Never   Marital Status: Divorced    Tobacco Counseling Counseling given: Not Answered   Clinical Intake:  Pre-visit preparation completed: Yes  Pain : No/denies pain     Nutritional Risks: None Diabetes: Yes CBG done?: No Did pt. bring in CBG monitor from home?: No  How often do you need to have someone help you when you read instructions, pamphlets, or other written materials from your doctor or pharmacy?: 1 - Never  Nutrition Risk Assessment:  Has the patient had any N/V/D within the last 2 months?  No  Does the patient have any non-healing wounds?  No  Has the patient had any unintentional weight loss or weight gain?  No   Diabetes:  Is the patient diabetic?  Yes  If diabetic, was a CBG obtained today?  No  Did the patient bring in their glucometer from home?  No  How often do you monitor your CBG's? 1-2 times daily.   Financial Strains and Diabetes Management:  Are you having any financial strains with the device, your supplies or your medication? No .  Does the patient want to be seen by Chronic Care Management for management of their diabetes?  No  Would the patient like to be referred to a Nutritionist or for Diabetic Management?  No   Diabetic Exams:  Diabetic Eye Exam: Completed 03/26/21.   Diabetic Foot Exam: Completed 10/16/20. Pt has been advised about the importance in completing this exam. Pt is scheduled for diabetic foot exam on 10/28/21.    Interpreter Needed?: No  Information entered by :: Clemetine Marker LPN   Activities of Daily Living    10/11/2021   10:04 AM 07/03/2021   10:13 AM  In your present state of health, do you have any difficulty performing the following activities:  Hearing? 1 1  Vision? 0 1  Difficulty concentrating or making decisions? 0 1  Walking or climbing stairs? 0 1  Dressing or bathing? 0 0  Doing errands, shopping? 0 0  Preparing Food and eating ? N   Using the Toilet? N   In the past six  months, have you accidently leaked urine? N   Do you have problems with loss of bowel control? N   Managing your Medications? N   Managing your Finances? N   Housekeeping or managing your Housekeeping? N     Patient Care Team: Jearld Fenton, NP as PCP - General (Internal Medicine)  Indicate any recent Medical Services you may have received from other than [facility] providers in the past year (date may be approximate).     Assessment:   This is a routine wellness examination for Avir.  Hearing/Vision screen Hearing Screening - Comments:: Pt c/o mild hearing difficulty Vision Screening - Comments:: Annual [facility] screenings with [facility]  Dietary issues and exercise activities discussed: Current Exercise Habits: The patient does not participate in regular exercise at present, Exercise limited by: None identified   Goals Addressed   None    Depression Screen    10/11/2021   10:01 AM 07/03/2021   10:12 AM 10/16/2020    2:01 PM 10/09/2020   10:43 AM 08/01/2019    2:27 PM 02/10/2019   10:08 AM 11/08/2018    8:33 AM  PHQ 2/9 Scores  PHQ - 2 Score '2 2 4 ' 0 '4 1 2  ' PHQ- 9 Score '5 6 11  12 8 7    ' Fall Risk    10/11/2021   10:04 AM 07/03/2021   10:12 AM 10/16/2020    2:01 PM 10/09/2020   10:41 AM 08/01/2019    2:27 PM  Fall Risk   Falls in the past year? 1 1 0 0 0  Number falls in past yr: 1 0 0  0  Injury with Fall? 0 0 0    Risk for fall due to : History of fall(s) History of fall(s)  Medication side effect;Impaired balance/gait No Fall Risks  Follow up Falls prevention discussed Falls evaluation completed Falls evaluation completed Falls evaluation completed;Education provided;Falls prevention discussed     FALL RISK PREVENTION PERTAINING TO THE HOME:  Any stairs in or around the home? No  If so, are there any without handrails? No  Home free of loose  throw rugs in walkways, pet beds, electrical cords, etc? Yes  Adequate lighting in your home to reduce risk of falls? Yes    ASSISTIVE DEVICES UTILIZED TO PREVENT FALLS:  Life alert? No  Use of a cane, walker or w/c? Yes Grab bars in the bathroom? Yes  Shower chair or bench in shower? No  Elevated toilet seat or a handicapped toilet? No   TIMED UP AND GO:  Was the test performed? No . Telephonic visit.   Cognitive Function: Normal cognitive status assessed by direct observation by this Nurse Health Advisor. No abnormalities found.          10/09/2020   10:46 AM  6CIT Screen  What Year? 0 points  What month? 0 points  What time? 0 points  Count back from 20 0 points  Months in reverse 4 points  Repeat phrase 8 points  Total Score 12 points    Immunizations Immunization History  Administered Date(s) Administered   Fluad Quad(high Dose 65+) 02/10/2019, 02/16/2020, 03/28/2021   Influenza,inj,Quad PF,6+ Mos 02/15/2018   Moderna Sars-Covid-2 Vaccination 04/26/2020   PFIZER(Purple Top)SARS-COV-2 Vaccination 07/22/2019, 08/24/2019   Pneumococcal Conjugate-13 05/03/2020   Pneumococcal Polysaccharide-23 01/28/2011   Tdap 10/08/2011    TDAP status: Due, Education has been provided regarding the importance of this vaccine. Advised may receive this vaccine at local pharmacy or Health Dept. Aware to provide a copy of the vaccination record if obtained from local pharmacy or Health Dept. Verbalized acceptance and understanding.  Flu Vaccine status: Up to date  Pneumococcal vaccine status: Up to date  Covid-19 vaccine status: Completed vaccines  Qualifies for Shingles Vaccine? Yes   Zostavax completed No   Shingrix Completed?: No.    Education has been provided regarding the importance of this vaccine. Patient has been advised to call insurance company to determine out of pocket expense if they have not yet received this vaccine. Advised may also receive vaccine at local pharmacy or Health Dept. Verbalized acceptance and understanding.  Screening Tests Health Maintenance  Topic Date Due   Zoster  Vaccines- Shingrix (1 of 2) Never done   COVID-19 Vaccine (4 - Booster) 06/21/2020   Pneumonia Vaccine 55+ Years old (3 - PPSV23 if available, else PCV20) 05/03/2021   TETANUS/TDAP  10/07/2021   FOOT EXAM  10/16/2021   HEMOGLOBIN A1C  10/21/2021   INFLUENZA VACCINE  12/24/2021   OPHTHALMOLOGY EXAM  03/26/2022   COLONOSCOPY (Pts 45-13yr Insurance coverage will need to be confirmed)  10/19/2029   Hepatitis C Screening  Completed   HPV VACCINES  Aged Out    Health Maintenance  Health Maintenance Due  Topic Date Due   Zoster Vaccines- Shingrix (1 of 2) Never done   COVID-19 Vaccine (4 - Booster) 06/21/2020   Pneumonia Vaccine 68 Years old (3 - PPSV23 if available, else PCV20) 05/03/2021   TETANUS/TDAP  10/07/2021    Colorectal cancer screening: Type of screening: Colonoscopy. Completed 10/20/19. Repeat every 10 years  Lung Cancer Screening: (Low Dose CT Chest recommended if Age 68-80years, 30 pack-year currently smoking OR have quit w/in 15years.) does qualify. Last done 12/11/20. Scheduled for 12/11/21  Additional Screening:  Hepatitis C Screening: does qualify; Completed 10/16/20  Vision Screening: Recommended annual ophthalmology exams for early detection of glaucoma and other disorders of the eye. Is the patient up to date with their annual eye exam?  Yes  Who is the provider or what is the name of the office in which the patient attends annual  eye exams? [facility].   Dental Screening: Recommended annual dental exams for proper oral hygiene  Community Resource Referral / Chronic Care Management: CRR required this visit?  No   CCM required this visit?  No      Plan:     I have personally reviewed and noted the following in the patient's chart:   Medical and social history Use of alcohol, tobacco or illicit drugs  Current medications and supplements including opioid prescriptions. Patient is not currently taking opioid prescriptions. Functional ability and  status Nutritional status Physical activity Advanced directives List of other physicians Hospitalizations, surgeries, and ER visits in previous 12 months Vitals Screenings to include cognitive, depression, and falls Referrals and appointments  In addition, I have reviewed and discussed with patient certain preventive protocols, quality metrics, and best practice recommendations. A written personalized care plan for preventive services as well as general preventive health recommendations were provided to patient.     Clemetine Marker, LPN   [id number]/70/[id number]   Nurse Notes: none

## 2021-10-11 NOTE — Patient Instructions (Signed)
[name] , Thank you for taking time to come for your Medicare Wellness Visit. I appreciate your ongoing commitment to your health goals. Please review the following plan we discussed and let me know if I can assist you in the future.   Screening recommendations/referrals: Colonoscopy: done 09/30/19. Repeat 09/2029 Recommended yearly ophthalmology/optometry visit for glaucoma screening and checkup Recommended yearly dental visit for hygiene and checkup  Vaccinations: Influenza vaccine: done 03/28/21 Pneumococcal vaccine: done 05/03/20 Tdap vaccine: due Shingles vaccine: Shingrix discussed. Please contact your pharmacy for coverage information.  Covid-19: done 07/22/19 7 08/24/19  Advanced directives: Advance directive discussed with you today. Even though you declined this today please call our office should you change your mind and we can give you the proper paperwork for you to fill out.   Conditions/risks identified: Keep up the great work!  Next appointment: Follow up in one year for your annual wellness visit.   Preventive Care 68 Years and Older, Male Preventive care refers to lifestyle choices and visits with your health care provider that can promote health and wellness. What does preventive care include? A yearly physical exam. This is also called an annual well check. Dental exams once or twice a year. Routine eye exams. Ask your health care provider how often you should have your eyes checked. Personal lifestyle choices, including: Daily care of your teeth and gums. Regular physical activity. Eating a healthy diet. Avoiding tobacco and drug use. Limiting alcohol use. Practicing safe sex. Taking low doses of aspirin every day. Taking vitamin and mineral supplements as recommended by your health care provider. What happens during an annual well check? The services and screenings done by your health care provider during your annual well check will depend on your age, overall  health, lifestyle risk factors, and family history of disease. Counseling  Your health care provider may ask you questions about your: Alcohol use. Tobacco use. Drug use. Emotional well-being. Home and relationship well-being. Sexual activity. Eating habits. History of falls. Memory and ability to understand (cognition). Work and work Statistician. Screening  You may have the following tests or measurements: Height, weight, and BMI. Blood pressure. Lipid and cholesterol levels. These may be checked every 5 years, or more frequently if you are over 68 years old. Skin check. Lung cancer screening. You may have this screening every year starting at age 68 if you have a 30-pack-year history of smoking and currently smoke or have quit within the past 15 years. Fecal occult blood test (FOBT) of the stool. You may have this test every year starting at age 68. Flexible sigmoidoscopy or colonoscopy. You may have a sigmoidoscopy every 5 years or a colonoscopy every 10 years starting at age 68. Prostate cancer screening. Recommendations will vary depending on your family history and other risks. Hepatitis C blood test. Hepatitis B blood test. Sexually transmitted disease (STD) testing. Diabetes screening. This is done by checking your blood sugar (glucose) after you have not eaten for a while (fasting). You may have this done every 1-3 years. Abdominal aortic aneurysm (AAA) screening. You may need this if you are a current or former smoker. Osteoporosis. You may be screened starting at age 32 if you are at high risk. Talk with your health care provider about your test results, treatment options, and if necessary, the need for more tests. Vaccines  Your health care provider may recommend certain vaccines, such as: Influenza vaccine. This is recommended every year. Tetanus, diphtheria, and acellular pertussis (Tdap, Td) vaccine.  You may need a Td booster every 10 years. Zoster vaccine. You may  need this after age 68. Pneumococcal 13-valent conjugate (PCV13) vaccine. One dose is recommended after age 24. Pneumococcal polysaccharide (PPSV23) vaccine. One dose is recommended after age 8. Talk to your health care provider about which screenings and vaccines you need and how often you need them. This information is not intended to replace advice given to you by your health care provider. Make sure you discuss any questions you have with your health care provider. Document Released: 06/08/2015 Document Revised: 01/30/2016 Document Reviewed: 03/13/2015 Elsevier Interactive Patient Education  2017 [place] Prevention in the Home Falls can cause injuries. They can happen to people of all ages. There are many things you can do to make your home safe and to help prevent falls. What can I do on the outside of my home? Regularly fix the edges of walkways and driveways and fix any cracks. Remove anything that might make you trip as you walk through a door, such as a raised step or threshold. Trim any bushes or trees on the path to your home. Use bright outdoor lighting. Clear any walking paths of anything that might make someone trip, such as rocks or tools. Regularly check to see if handrails are loose or broken. Make sure that both sides of any steps have handrails. Any raised decks and porches should have guardrails on the edges. Have any leaves, snow, or ice cleared regularly. Use sand or salt on walking paths during winter. Clean up any spills in your garage right away. This includes oil or grease spills. What can I do in the bathroom? Use night lights. Install grab bars by the toilet and in the tub and shower. Do not use towel bars as grab bars. Use non-skid mats or decals in the tub or shower. If you need to sit down in the shower, use a plastic, non-slip stool. Keep the floor dry. Clean up any water that spills on the floor as soon as it happens. Remove soap buildup in  the tub or shower regularly. Attach bath mats securely with double-sided non-slip rug tape. Do not have throw rugs and other things on the floor that can make you trip. What can I do in the bedroom? Use night lights. Make sure that you have a light by your bed that is easy to reach. Do not use any sheets or blankets that are too big for your bed. They should not hang down onto the floor. Have a firm chair that has side arms. You can use this for support while you get dressed. Do not have throw rugs and other things on the floor that can make you trip. What can I do in the kitchen? Clean up any spills right away. Avoid walking on wet floors. Keep items that you use a lot in easy-to-reach places. If you need to reach something above you, use a strong step stool that has a grab bar. Keep electrical cords out of the way. Do not use floor polish or wax that makes floors slippery. If you must use wax, use non-skid floor wax. Do not have throw rugs and other things on the floor that can make you trip. What can I do with my stairs? Do not leave any items on the stairs. Make sure that there are handrails on both sides of the stairs and use them. Fix handrails that are broken or loose. Make sure that handrails are as long as  the stairways. Check any carpeting to make sure that it is firmly attached to the stairs. Fix any carpet that is loose or worn. Avoid having throw rugs at the top or bottom of the stairs. If you do have throw rugs, attach them to the floor with carpet tape. Make sure that you have a light switch at the top of the stairs and the bottom of the stairs. If you do not have them, ask someone to add them for you. What else can I do to help prevent falls? Wear shoes that: Do not have high heels. Have rubber bottoms. Are comfortable and fit you well. Are closed at the toe. Do not wear sandals. If you use a stepladder: Make sure that it is fully opened. Do not climb a closed  stepladder. Make sure that both sides of the stepladder are locked into place. Ask someone to hold it for you, if possible. Clearly mark and make sure that you can see: Any grab bars or handrails. First and last steps. Where the edge of each step is. Use tools that help you move around (mobility aids) if they are needed. These include: Canes. Walkers. Scooters. Crutches. Turn on the lights when you go into a dark area. Replace any light bulbs as soon as they burn out. Set up your furniture so you have a clear path. Avoid moving your furniture around. If any of your floors are uneven, fix them. If there are any pets around you, be aware of where they are. Review your medicines with your doctor. Some medicines can make you feel dizzy. This can increase your chance of falling. Ask your doctor what other things that you can do to help prevent falls. This information is not intended to replace advice given to you by your health care provider. Make sure you discuss any questions you have with your health care provider. Document Released: 03/08/2009 Document Revised: 10/18/2015 Document Reviewed: 06/16/2014 Elsevier Interactive Patient Education  2017 [employer].

## 2021-10-15 ENCOUNTER — Ambulatory Visit: Payer: Medicare Other

## 2021-10-18 ENCOUNTER — Other Ambulatory Visit: Payer: Self-pay | Admitting: Family Medicine

## 2021-10-18 ENCOUNTER — Other Ambulatory Visit: Payer: Self-pay | Admitting: Internal Medicine

## 2021-10-18 DIAGNOSIS — I10 Essential (primary) hypertension: Secondary | ICD-10-CM

## 2021-10-18 DIAGNOSIS — E785 Hyperlipidemia, unspecified: Secondary | ICD-10-CM

## 2021-10-18 DIAGNOSIS — E1169 Type 2 diabetes mellitus with other specified complication: Secondary | ICD-10-CM

## 2021-10-18 DIAGNOSIS — K219 Gastro-esophageal reflux disease without esophagitis: Secondary | ICD-10-CM

## 2021-10-18 DIAGNOSIS — Z794 Long term (current) use of insulin: Secondary | ICD-10-CM

## 2021-10-22 NOTE — Telephone Encounter (Signed)
All medications requested pass RF protocol except: Ezetimibe- Rx 04/28/21 #90 2RF- too soon Buspirone- Rx 07/03/21 #180 1RF- too soon Requested Prescriptions  Pending Prescriptions Disp Refills  . omeprazole (PRILOSEC) 20 MG capsule [Pharmacy Med Name: OMEPRAZOLE DR 20 MG CAP] 90 capsule 0    Sig: TAKE 1 CAPSULE BY MOUTH ONCE DAILY     Gastroenterology: Proton Pump Inhibitors Passed - 10/18/2021 10:39 AM      Passed - Valid encounter within last 12 months    Recent Outpatient Visits          3 months ago Other chest pain   [facility] [place], [place] W, NP   6 months ago Type 2 diabetes mellitus with other specified complication, without long-term current use of insulin [facility])   [facility], Coralie Keens, NP   9 months ago Essential hypertension   [facility] [place], Coralie Keens, NP   1 year ago Type 2 diabetes mellitus with other specified complication, without long-term current use of insulin [facility])   [facility] [place], Coralie Keens, NP   1 year ago Type 2 diabetes mellitus with other specified complication, without long-term current use of insulin ([place])   [place], Devonne Doughty, DO      Future Appointments            In 6 days Baity, Coralie Keens, NP [facility] [place]           . lisinopril (ZESTRIL) 20 MG tablet [Pharmacy Med Name: LISINOPRIL 20 MG TAB] 90 tablet 0    Sig: TAKE 1 TABLET BY MOUTH ONCE DAILY HIGH BLOOD PRESSURE     Cardiovascular:  ACE Inhibitors Failed - 10/18/2021 10:39 AM      Failed - Cr in normal range and within 180 days    Creat  Date Value Ref Range Status  04/23/2021 1.11 0.70 - 1.35 mg/dL Final   Creatinine, Ser  Date Value Ref Range Status  07/02/2021 1.29 (H) 0.61 - 1.24 mg/dL Final         Passed - K in normal range and within 180 days    Potassium  Date Value Ref Range Status  07/02/2021 4.2 3.5 - 5.1 mmol/L Final  02/05/2013 3.8 3.5 - 5.1  mmol/L Final         Passed - Patient is not pregnant      Passed - Last BP in normal range    BP Readings from Last 1 Encounters:  07/03/21 127/82         Passed - Valid encounter within last 6 months    Recent Outpatient Visits          3 months ago Other chest pain   [facility] [place], Coralie Keens, NP   6 months ago Type 2 diabetes mellitus with other specified complication, without long-term current use of insulin [facility])   [facility], Coralie Keens, NP   9 months ago Essential hypertension   [facility] [place], [place] W, NP   1 year ago Type 2 diabetes mellitus with other specified complication, without long-term current use of insulin [facility])   [facility] [place], [place] W, NP   1 year ago Type 2 diabetes mellitus with other specified complication, without long-term current use of insulin [facility])   [place], Devonne Doughty, DO      Future Appointments  In 6 days Baity, Coralie Keens, NP [place]           . hydrochlorothiazide (MICROZIDE) 12.5 MG capsule [Pharmacy Med Name: HYDROCHLOROTHIAZIDE 12.5 MG CAP] 90 capsule 0    Sig: TAKE 1 CAPSULE BY MOUTH ONCE DAILY HIGH BLOOD PRESSURE     Cardiovascular: Diuretics - Thiazide Failed - 10/18/2021 10:39 AM      Failed - Cr in normal range and within 180 days    Creat  Date Value Ref Range Status  04/23/2021 1.11 0.70 - 1.35 mg/dL Final   Creatinine, Ser  Date Value Ref Range Status  07/02/2021 1.29 (H) 0.61 - 1.24 mg/dL Final         Passed - K in normal range and within 180 days    Potassium  Date Value Ref Range Status  07/02/2021 4.2 3.5 - 5.1 mmol/L Final  02/05/2013 3.8 3.5 - 5.1 mmol/L Final         Passed - Na in normal range and within 180 days    Sodium  Date Value Ref Range Status  07/02/2021 140 135 - 145 mmol/L Final  02/05/2013 137 136 - 145 mmol/L Final         Passed - Last BP in  normal range    BP Readings from Last 1 Encounters:  07/03/21 127/82         Passed - Valid encounter within last 6 months    Recent Outpatient Visits          3 months ago Other chest pain   [facility] [place], Coralie Keens, NP   6 months ago Type 2 diabetes mellitus with other specified complication, without long-term current use of insulin ([place])   [facility] [place], Coralie Keens, NP   9 months ago Essential hypertension   [facility] [place], Coralie Keens, NP   1 year ago Type 2 diabetes mellitus with other specified complication, without long-term current use of insulin ([place])   [facility] [place], Coralie Keens, NP   1 year ago Type 2 diabetes mellitus with other specified complication, without long-term current use of insulin ([place])   [place], Devonne Doughty, DO      Future Appointments            In 6 days Baity, Coralie Keens, NP [facility] [place]           . metFORMIN (GLUCOPHAGE) 1000 MG tablet [Pharmacy Med Name: METFORMIN HCL 1000 MG TAB] 180 tablet 0    Sig: TAKE 1 TABLET BY MOUTH TWICE DAILY WITH FOOD FOR DIABETES     Endocrinology:  Diabetes - Biguanides Failed - 10/18/2021 10:39 AM      Failed - Cr in normal range and within 360 days    Creat  Date Value Ref Range Status  04/23/2021 1.11 0.70 - 1.35 mg/dL Final   Creatinine, Ser  Date Value Ref Range Status  07/02/2021 1.29 (H) 0.61 - 1.24 mg/dL Final         Failed - B12 Level in normal range and within 720 days    No results found for: VITAMINB12       Failed - CBC within normal limits and completed in the last 12 months    WBC  Date Value Ref Range Status  07/02/2021 6.5 4.0 - 10.5 K/uL Final   RBC  Date Value Ref Range Status  07/02/2021  3.69 (L) 4.22 - 5.81 MIL/uL Final   Hemoglobin  Date Value Ref Range Status  07/02/2021 11.3 (L) 13.0 - 17.0 g/dL Final   HGB  Date Value Ref Range Status  02/05/2013 13.0  13.0 - 18.0 g/dL Final   HCT  Date Value Ref Range Status  07/02/2021 32.2 (L) 39.0 - 52.0 % Final  02/05/2013 36.7 (L) 40.0 - 52.0 % Final   MCHC  Date Value Ref Range Status  07/02/2021 35.1 30.0 - 36.0 g/dL Final   [facility]  Date Value Ref Range Status  07/02/2021 30.6 26.0 - 34.0 pg Final   MCV  Date Value Ref Range Status  07/02/2021 87.3 80.0 - 100.0 fL Final  02/05/2013 93 80 - 100 fL Final   No results found for: PLTCOUNTKUC, LABPLAT, POCPLA RDW  Date Value Ref Range Status  07/02/2021 13.0 11.5 - 15.5 % Final  02/05/2013 13.5 11.5 - 14.5 % Final         Passed - HBA1C is between 0 and 7.9 and within 180 days    Hgb A1c MFr Bld  Date Value Ref Range Status  04/23/2021 5.9 (H) <5.7 % of total Hgb Final    Comment:    For someone without known diabetes, a hemoglobin  A1c value between 5.7% and 6.4% is consistent with prediabetes and should be confirmed with a  follow-up test. . For someone with known diabetes, a value <7% indicates that their diabetes is well controlled. A1c targets should be individualized based on duration of diabetes, age, comorbid conditions, and other considerations. . This assay result is consistent with an increased risk of diabetes. . Currently, no consensus exists regarding use of hemoglobin A1c for diagnosis of diabetes for children. .          Passed - eGFR in normal range and within 360 days    GFR, Est African American  Date Value Ref Range Status  02/16/2020 70 > OR = 60 mL/min/1.75m Final   GFR, Est Non African American  Date Value Ref Range Status  02/16/2020 61 > OR = 60 mL/min/1.749mFinal   GFR, Estimated  Date Value Ref Range Status  07/02/2021 >60 >60 mL/min Final    Comment:    (NOTE) Calculated using the CKD-EPI Creatinine Equation (2021)    eGFR  Date Value Ref Range Status  04/23/2021 73 > OR = 60 mL/min/1.7335minal    Comment:    The eGFR is based on the CKD-EPI 2021 equation. To calculate  the  new eGFR from a previous Creatinine or Cystatin C result, go to https://www.kidney.org/professionals/ kdoqi/gfr%5Fcalculator          Passed - Valid encounter within last 6 months    Recent Outpatient Visits          3 months ago Other chest pain   [facility] [facility]   6 months ago Type 2 diabetes mellitus with other specified complication, without long-term current use of insulin ([facility]   9 months ago Essential hypertension   [facility]   1 year ago Type 2 diabetes mellitus with other specified complication, without long-term current use of insulin ([facility]   1 year ago Type 2 diabetes mellitus with other specified complication, without long-term current use of insulin ([facility] Coil  J, DO      Future Appointments            In 6 days Baity, Coralie Keens, NP [facility], [place]           . atorvastatin (LIPITOR) 40 MG tablet [Pharmacy Med Name: ATORVASTATIN CALCIUM 40 MG TAB] 90 tablet 0    Sig: TAKE 1 TABLET BY MOUTH AT BEDTIME FOR HIGH CHOLESTEROL     Cardiovascular:  Antilipid - Statins Failed - 10/18/2021 10:39 AM      Failed - Lipid Panel in normal range within the last 12 months    Cholesterol  Date Value Ref Range Status  04/23/2021 91 <200 mg/dL Final   LDL Cholesterol (Calc)  Date Value Ref Range Status  04/23/2021 32 mg/dL (calc) Final    Comment:    Reference range: <100 . Desirable range <100 mg/dL for primary prevention;   <70 mg/dL for patients with CHD or diabetic patients  with > or = 2 CHD risk factors. [name] Kitchen LDL-C is now calculated using the Martin-Hopkins  calculation, which is a validated novel method providing  better accuracy than the Friedewald equation in the  estimation of LDL-C.  Cresenciano Genre et al. Annamaria Helling. 9892;119(41): 2061-2068   (http://education.QuestDiagnostics.com/faq/FAQ164)    HDL  Date Value Ref Range Status  04/23/2021 33 (L) > OR = 40 mg/dL Final   Triglycerides  Date Value Ref Range Status  04/23/2021 188 (H) <150 mg/dL Final         Passed - Patient is not pregnant      Passed - Valid encounter within last 12 months    Recent Outpatient Visits          3 months ago Other chest pain   [facility] [place], [place] W, NP   6 months ago Type 2 diabetes mellitus with other specified complication, without long-term current use of insulin ([place])   [facility], Coralie Keens, NP   9 months ago Essential hypertension   [facility] [place], [place] W, NP   1 year ago Type 2 diabetes mellitus with other specified complication, without long-term current use of insulin ([place])   [facility] [place], [place] W, NP   1 year ago Type 2 diabetes mellitus with other specified complication, without long-term current use of insulin ([place])   [place], Devonne Doughty, DO      Future Appointments            In 6 days Garnette [place], Coralie Keens, NP [facility] [place]           . ezetimibe (ZETIA) 10 MG tablet [Pharmacy Med Name: EZETIMIBE 10 MG TAB] 90 tablet 2    Sig: TAKE 1 TABLET BY MOUTH ONCE DAILY     Cardiovascular:  Antilipid - Sterol Transport Inhibitors Failed - 10/18/2021 10:39 AM      Failed - Lipid Panel in normal range within the last 12 months    Cholesterol  Date Value Ref Range Status  04/23/2021 91 <200 mg/dL Final   LDL Cholesterol (Calc)  Date Value Ref Range Status  04/23/2021 32 mg/dL (calc) Final    Comment:    Reference range: <100 . Desirable range <100 mg/dL for primary prevention;   <70 mg/dL for patients with CHD or diabetic patients  with > or = 2 CHD risk factors. [name] Kitchen LDL-C is now calculated using the Martin-Hopkins  calculation, which is  a validated novel method providing  better  accuracy than the Friedewald equation in the  estimation of LDL-C.  Cresenciano Genre et al. Annamaria Helling. 2035;597(41): 2061-2068  (http://education.Quest[employer].com/faq/FAQ164)    HDL  Date Value Ref Range Status  04/23/2021 33 (L) > OR = 40 mg/dL Final   Triglycerides  Date Value Ref Range Status  04/23/2021 188 (H) <150 mg/dL Final         Passed - AST in normal range and within 360 days    AST  Date Value Ref Range Status  01/21/2021 17 10 - 35 U/L Final   SGOT(AST)  Date Value Ref Range Status  02/05/2013 91 (H) 15 - 37 Unit/L Final         Passed - ALT in normal range and within 360 days    ALT  Date Value Ref Range Status  01/21/2021 23 9 - 46 U/L Final   SGPT (ALT)  Date Value Ref Range Status  02/05/2013 144 (H) 12 - 78 U/L Final         Passed - Patient is not pregnant      Passed - Valid encounter within last 12 months    Recent Outpatient Visits          3 months ago Other chest pain   [facility] [place], [place] W, NP   6 months ago Type 2 diabetes mellitus with other specified complication, without long-term current use of insulin [facility])   [facility], Coralie Keens, NP   9 months ago Essential hypertension   [place], [place] W, NP   1 year ago Type 2 diabetes mellitus with other specified complication, without long-term current use of insulin [place])   [facility] [place], [place] W, NP   1 year ago Type 2 diabetes mellitus with other specified complication, without long-term current use of insulin ([place])   [place], Devonne Doughty, DO      Future Appointments            In 6 days Baity, Coralie Keens, NP [facility] [place], [place]           . ULTRACARE PEN NEEDLES 32G X 5 MM MISC [Pharmacy Med Name: ULTRACARE PEN NEEDLES 32G X 5 MM] 90 each 0    Sig: USE AS DIRECTED. WITH INJECT 0.375MLS (0.5MG TOTAL) SUBCUTANEOUSLY ONCE A WEEK     Endocrinology: Diabetes  - Testing Supplies Passed - 10/18/2021 10:39 AM      Passed - Valid encounter within last 12 months    Recent Outpatient Visits          3 months ago Other chest pain   [facility] [place], [place], NP   6 months ago Type 2 diabetes mellitus with other specified complication, without long-term current use of insulin [facility])   [facility], Coralie Keens, NP   9 months ago Essential hypertension   [facility] [place], [place], NP   1 year ago Type 2 diabetes mellitus with other specified complication, without long-term current use of insulin [facility])   [facility] [place], Coralie Keens, NP   1 year ago Type 2 diabetes mellitus with other specified complication, without long-term current use of insulin [facility])   [place], Devonne Doughty, DO      Future Appointments  In 6 days Baity, Coralie Keens, NP [facility]           . busPIRone (BUSPAR) 5 MG tablet [Pharmacy Med Name: BUSPIRONE HCL 5 MG TAB] 180 tablet 1    Sig: TAKE 1 TABLET BY MOUTH TWICE DAILY     Psychiatry: Anxiolytics/Hypnotics - Non-controlled Passed - 10/18/2021 10:39 AM      Passed - Valid encounter within last 12 months    Recent Outpatient Visits          3 months ago Other chest pain   [facility] [place], [place] W, NP   6 months ago Type 2 diabetes mellitus with other specified complication, without long-term current use of insulin [facility])   [facility], Coralie Keens, NP   9 months ago Essential hypertension   [facility] [place], [place], NP   1 year ago Type 2 diabetes mellitus with other specified complication, without long-term current use of insulin [facility])   [facility] [place], Coralie Keens, NP   1 year ago Type 2 diabetes mellitus with other specified complication, without long-term current use of insulin ([place])   [place], Devonne Doughty, DO      Future Appointments            In 6 days Baity, Coralie Keens, NP [facility], [facility] [place]

## 2021-10-22 NOTE — Telephone Encounter (Signed)
Requested Prescriptions  Pending Prescriptions Disp Refills  . ACCU-CHEK GUIDE test strip [Pharmacy Med Name: ACCU-CHEK GUIDE STRIP] 200 each 3    Sig: TEST SUGAR UP TO TWICE DAILY     Endocrinology: Diabetes - Testing Supplies Passed - 10/18/2021 10:41 AM      Passed - Valid encounter within last 12 months    Recent Outpatient Visits          3 months ago Other chest pain   [facility] [place], [place] W, NP   6 months ago Type 2 diabetes mellitus with other specified complication, without long-term current use of insulin [facility])   [facility], Coralie Keens, NP   9 months ago Essential hypertension   [facility] [place], Coralie Keens, NP   1 year ago Type 2 diabetes mellitus with other specified complication, without long-term current use of insulin [facility])   [facility] [place], Coralie Keens, NP   1 year ago Type 2 diabetes mellitus with other specified complication, without long-term current use of insulin ([place])   [place], Devonne Doughty, DO      Future Appointments            In 6 days Baity, Coralie Keens, NP [facility], [place]           . Accu-Chek Softclix Lancets lancets [Pharmacy Med Name: ACCU-CHEK SOFTCLIX LANCETS] 200 each     Sig: TEST BLOOD SUGAR UP TO TWICE DAILY     Endocrinology: Diabetes - Testing Supplies Passed - 10/18/2021 10:41 AM      Passed - Valid encounter within last 12 months    Recent Outpatient Visits          3 months ago Other chest pain   [facility] [place], Coralie Keens, NP   6 months ago Type 2 diabetes mellitus with other specified complication, without long-term current use of insulin [facility])   [facility], Coralie Keens, NP   9 months ago Essential hypertension   [facility] [place], Coralie Keens, NP   1 year ago Type 2 diabetes mellitus with other specified complication, without long-term current use of insulin [facility])    [facility] [place], Coralie Keens, NP   1 year ago Type 2 diabetes mellitus with other specified complication, without long-term current use of insulin ([place])   [facility] Olin Hauser, DO      Future Appointments            In 6 days Baity, Coralie Keens, NP [facility]

## 2021-10-28 ENCOUNTER — Ambulatory Visit (INDEPENDENT_AMBULATORY_CARE_PROVIDER_SITE_OTHER): Payer: Medicare Other | Admitting: Internal Medicine

## 2021-10-28 ENCOUNTER — Encounter: Payer: Self-pay | Admitting: Internal Medicine

## 2021-10-28 VITALS — BP 112/72 | HR 96 | Temp 97.1°F | Ht 66.0 in | Wt 151.0 lb

## 2021-10-28 DIAGNOSIS — H9193 Unspecified hearing loss, bilateral: Secondary | ICD-10-CM | POA: Diagnosis not present

## 2021-10-28 DIAGNOSIS — Z125 Encounter for screening for malignant neoplasm of prostate: Secondary | ICD-10-CM

## 2021-10-28 DIAGNOSIS — I700 Atherosclerosis of aorta: Secondary | ICD-10-CM | POA: Diagnosis not present

## 2021-10-28 DIAGNOSIS — Z0001 Encounter for general adult medical examination with abnormal findings: Secondary | ICD-10-CM | POA: Diagnosis not present

## 2021-10-28 DIAGNOSIS — Z23 Encounter for immunization: Secondary | ICD-10-CM

## 2021-10-28 DIAGNOSIS — E1169 Type 2 diabetes mellitus with other specified complication: Secondary | ICD-10-CM

## 2021-10-28 MED ORDER — ASPIRIN 81 MG PO TBEC: 81.0000 mg | DELAYED_RELEASE_TABLET | Freq: Every day | ORAL | 12 refills | Status: AC

## 2021-10-28 NOTE — Patient Instructions (Signed)
Health Maintenance After Age 68 After age 68, you are at a higher risk for certain long-term diseases and infections as well as injuries from falls. Falls are a major cause of broken bones and head injuries in people who are older than age 68. Getting regular preventive care can help to keep you healthy and well. Preventive care includes getting regular testing and making lifestyle changes as recommended by your health care provider. Talk with your health care provider about: Which screenings and tests you should have. A screening is a test that checks for a disease when you have no symptoms. A diet and exercise plan that is right for you. What should I know about screenings and tests to prevent falls? Screening and testing are the best ways to find a health problem early. Early diagnosis and treatment give you the best chance of managing medical conditions that are common after age 68. Certain conditions and lifestyle choices may make you more likely to have a fall. Your health care provider may recommend: Regular vision checks. Poor vision and conditions such as cataracts can make you more likely to have a fall. If you wear glasses, make sure to get your prescription updated if your vision changes. Medicine review. Work with your health care provider to regularly review all of the medicines you are taking, including over-the-counter medicines. Ask your health care provider about any side effects that may make you more likely to have a fall. Tell your health care provider if any medicines that you take make you feel dizzy or sleepy. Strength and balance checks. Your health care provider may recommend certain tests to check your strength and balance while standing, walking, or changing positions. Foot health exam. Foot pain and numbness, as well as not wearing proper footwear, can make you more likely to have a fall. Screenings, including: Osteoporosis screening. Osteoporosis is a condition that causes  the bones to get weaker and break more easily. Blood pressure screening. Blood pressure changes and medicines to control blood pressure can make you feel dizzy. Depression screening. You may be more likely to have a fall if you have a fear of falling, feel depressed, or feel unable to do activities that you used to do. Alcohol use screening. Using too much alcohol can affect your balance and may make you more likely to have a fall. Follow these instructions at home: Lifestyle Do not drink alcohol if: Your health care provider tells you not to drink. If you drink alcohol: Limit how much you have to: 0-1 drink a day for women. 0-2 drinks a day for men. Know how much alcohol is in your drink. In the [place] one drink equals one 12 oz bottle of beer (355 mL), one 5 oz glass of wine (148 mL), or one 1 oz glass of hard liquor (44 mL). Do not use any products that contain nicotine or tobacco. These products include cigarettes, chewing tobacco, and vaping devices, such as e-cigarettes. If you need help quitting, ask your health care provider. Activity  Follow a regular exercise program to stay fit. This will help you maintain your balance. Ask your health care provider what types of exercise are appropriate for you. If you need a cane or walker, use it as recommended by your health care provider. Wear supportive shoes that have nonskid soles. Safety  Remove any tripping hazards, such as rugs, cords, and clutter. Install safety equipment such as grab bars in bathrooms and safety rails on stairs. Keep rooms and walkways   well-lit. General instructions Talk with your health care provider about your risks for falling. Tell your health care provider if: You fall. Be sure to tell your health care provider about all falls, even ones that seem minor. You feel dizzy, tiredness (fatigue), or off-balance. Take over-the-counter and prescription medicines only as told by your health care provider. These include  supplements. Eat a healthy diet and maintain a healthy weight. A healthy diet includes low-fat dairy products, low-fat (lean) meats, and fiber from whole grains, beans, and lots of fruits and vegetables. Stay current with your vaccines. Schedule regular health, dental, and eye exams. Summary Having a healthy lifestyle and getting preventive care can help to protect your health and wellness after age 68. Screening and testing are the best way to find a health problem early and help you avoid having a fall. Early diagnosis and treatment give you the best chance for managing medical conditions that are more common for people who are older than age 68. Falls are a major cause of broken bones and head injuries in people who are older than age 68. Take precautions to prevent a fall at home. Work with your health care provider to learn what changes you can make to improve your health and wellness and to prevent falls. This information is not intended to replace advice given to you by your health care provider. Make sure you discuss any questions you have with your health care provider. Document Revised: 10/01/2020 Document Reviewed: 10/01/2020 Elsevier Patient Education  2023 [employer]vier Inc.  

## 2021-10-28 NOTE — Progress Notes (Signed)
Subjective:    Patient ID: [name], male    DOB: 09/12/1955, 66 y.o.   MRN: [id number]  HPI  Patient presents to clinic today for his annual exam.  Flu: 03/2021 Tetanus: 09/2011 COVID: Mentor x2, Moderna x1 Pneumovax: 01/2011 Prevnar: 04/2020 Shingrix: Never PSA screening: > 2 years ago Colon screening: 09/2019 Vision screening: annually Dentist: as needed  Diet: He does eat some meat. He consumes fruits and veggies. He tries to avoid fried foods. He drinks mostly unsweet tea Exercise: Walking  Review of Systems   Past Medical History:  Diagnosis Date   Anxiety    COPD (chronic obstructive pulmonary disease) (HCC)    Diabetes mellitus without complication (HCC)    GERD (gastroesophageal reflux disease)    Hypertension    Insomnia    Personal history of tobacco use, presenting hazards to health 05/31/2015    Current Outpatient Medications  Medication Sig Dispense Refill   ACCU-CHEK GUIDE test strip TEST SUGAR UP TO TWICE DAILY 200 each 3   Accu-Chek Softclix Lancets lancets TEST BLOOD SUGAR UP TO TWICE DAILY 200 each 3   albuterol (PROVENTIL) (2.5 MG/3ML) 0.083% nebulizer solution Take 3 mLs (2.5 mg total) by nebulization every 6 (six) hours as needed for wheezing or shortness of breath. 75 mL 1   atorvastatin (LIPITOR) 40 MG tablet TAKE 1 TABLET BY MOUTH AT BEDTIME FOR HIGH CHOLESTEROL 90 tablet 0   Blood Glucose Monitoring Suppl (ACCU-CHEK GUIDE ME) w/Device KIT Use to check blood sugar up to 2 times daily 1 kit 0   busPIRone (BUSPAR) 5 MG tablet Take 1 tablet (5 mg total) by mouth 2 (two) times daily. (Patient not taking: Reported on 10/11/2021) 180 tablet 1   ezetimibe (ZETIA) 10 MG tablet TAKE 1 TABLET BY MOUTH ONCE DAILY 90 tablet 2   hydrochlorothiazide (MICROZIDE) 12.5 MG capsule TAKE 1 CAPSULE BY MOUTH ONCE DAILY HIGH BLOOD PRESSURE 90 capsule 0   lisinopril (ZESTRIL) 20 MG tablet TAKE 1 TABLET BY MOUTH ONCE DAILY HIGH BLOOD PRESSURE 90 tablet 0    metFORMIN (GLUCOPHAGE) 1000 MG tablet TAKE 1 TABLET BY MOUTH TWICE DAILY WITH FOOD FOR DIABETES 180 tablet 0   omeprazole (PRILOSEC) 20 MG capsule TAKE 1 CAPSULE BY MOUTH ONCE DAILY 90 capsule 0   ULTRACARE PEN NEEDLES 32G X 5 MM MISC USE AS DIRECTED. WITH INJECT 0.375MLS (0.5MG TOTAL) SUBCUTANEOUSLY ONCE A WEEK 90 each 0   No current facility-administered medications for this visit.    Allergies  Allergen Reactions   Codeine     Other reaction(s): Vomiting    Family History  Problem Relation Age of Onset   Renal Disease Mother 40   Heart disease Father    Stroke Father    Alzheimer's disease Father    Stroke Brother    Diabetes Brother    Diabetes Maternal Grandfather     Social History   Socioeconomic History   Marital status: Divorced    Spouse name: Not on file   Number of children: 3   Years of education: Not on file   Highest education level: 10th grade  Occupational History   Occupation: retired  Tobacco Use   Smoking status: Former    Packs/day: 1.00    Years: 40.00    Pack years: 40.00    Types: Cigarettes    Quit date: 07/25/2018    Years since quitting: 3.2   Smokeless tobacco: Former  Scientific laboratory technician Use: Never used  Substance and Sexual Activity   Alcohol use: Not Currently    Comment: quit 2003, drank about 5 beer per week prior   Drug use: Never   Sexual activity: Yes    Birth control/protection: None  Other Topics Concern   Not on file  Social History Narrative   Not on file   Social Determinants of Health   Financial Resource Strain: Low Risk    Difficulty of Paying Living Expenses: Not hard at all  Food Insecurity: No Food Insecurity   Worried About Charity fundraiser in the Last Year: Never true   [place] in the Last Year: Never true  Transportation Needs: No Transportation Needs   Lack of Transportation (Medical): No   Lack of Transportation (Non-Medical): No  Physical Activity: Inactive   Days of Exercise per Week: 0  days   Minutes of Exercise per Session: 0 min  Stress: No Stress Concern Present   Feeling of Stress : Not at all  Social Connections: Socially Isolated   Frequency of Communication with Friends and Family: More than three times a week   Frequency of Social Gatherings with Friends and Family: Three times a week   Attends Religious Services: Never   Active Member of Clubs or Organizations: No   Attends Archivist Meetings: Never   Marital Status: Divorced  Human resources officer Violence: Not At Risk   Fear of Current or Ex-Partner: No   Emotionally Abused: No   Physically Abused: No   Sexually Abused: No     Constitutional: Denies fever, malaise, fatigue, headache or abrupt weight changes.  HEENT: Pt reports decreased hearing. Denies eye pain, eye redness, ear pain, ringing in the ears, wax buildup, runny nose, nasal congestion, bloody nose, or sore throat. Respiratory: Denies difficulty breathing, shortness of breath, cough or sputum production.   Cardiovascular: Denies chest pain, chest tightness, palpitations or swelling in the hands or feet.  Gastrointestinal: Denies abdominal pain, bloating, constipation, diarrhea or blood in the stool.  GU: Denies urgency, frequency, pain with urination, burning sensation, blood in urine, odor or discharge. Musculoskeletal: Denies decrease in range of motion, difficulty with gait, muscle pain or joint pain and swelling.  Skin: Denies redness, rashes, lesions or ulcercations.  Neurological: Patient reports insomnia.  Denies dizziness, difficulty with memory, difficulty with speech or problems with balance and coordination.  Psych: Patient has a history of anxiety.  Denies depression, SI/HI.  No other specific complaints in a complete review of systems (except as listed in HPI above).   Objective:   Physical Exam  BP 112/72 (BP Location: Left Arm, Patient Position: Sitting, Cuff Size: Normal)   Pulse 96   Temp (!) 97.1 F (36.2 C)  (Temporal)   Ht _0  (1.676 m)   Wt 151 lb (68.5 kg)   SpO2 100%   BMI 24.37 kg/m   Wt Readings from Last 3 Encounters:  07/03/21 141 lb (64 kg)  07/02/21 140 lb (63.5 kg)  04/23/21 145 lb (65.8 kg)    General: Appears older than his stated age, well developed, well nourished in NAD. Skin: Warm, dry and intact. No ulcerations noted. HEENT: Head: normal shape and size; Eyes: sclera white, no icterus, conjunctiva pink, PERRLA and EOMs intact; Ears: Tm's gray and intact, normal light reflex, slight wax buildup;  Neck:  Neck supple, trachea midline. No masses, lumps or thyromegaly present.  Cardiovascular: Normal rate and rhythm. S1,S2 noted.  No murmur, rubs or gallops noted. No  JVD or BLE edema. No carotid bruits noted. Pulmonary/Chest: Normal effort and positive vesicular breath sounds. No respiratory distress. No wheezes, rales or ronchi noted.  Abdomen: Soft and nontender. Normal bowel sounds. Musculoskeletal: Strength 5/5 BUE/BLE. No difficulty with gait.  Neurological: Alert and oriented. Cranial nerves II-XII grossly intact. Coordination normal.  Psychiatric: Mood and affect normal. Behavior is normal. Judgment and thought content normal.    BMET    Component Value Date/Time   NA 140 07/02/2021 1241   NA 137 02/05/2013 2355   K 4.2 07/02/2021 1241   K 3.8 02/05/2013 2355   CL 108 07/02/2021 1241   CL 107 02/05/2013 2355   CO2 22 07/02/2021 1241   CO2 23 02/05/2013 2355   GLUCOSE 143 (H) 07/02/2021 1241   GLUCOSE 113 (H) 02/05/2013 2355   BUN 22 07/02/2021 1241   BUN 9 02/05/2013 2355   CREATININE 1.29 (H) 07/02/2021 1241   CREATININE 1.11 04/23/2021 1359   CALCIUM 9.3 07/02/2021 1241   CALCIUM 8.9 02/05/2013 2355   GFRNONAA >60 07/02/2021 1241   GFRNONAA 61 02/16/2020 0922   GFRAA 70 02/16/2020 0922    Lipid Panel     Component Value Date/Time   CHOL 91 04/23/2021 1359   TRIG 188 (H) 04/23/2021 1359   HDL 33 (L) 04/23/2021 1359   CHOLHDL 2.8 04/23/2021 1359    LDLCALC 32 04/23/2021 1359    CBC    Component Value Date/Time   WBC 6.5 07/02/2021 1241   RBC 3.69 (L) 07/02/2021 1241   HGB 11.3 (L) 07/02/2021 1241   HGB 13.0 02/05/2013 2355   HCT 32.2 (L) 07/02/2021 1241   HCT 36.7 (L) 02/05/2013 2355   PLT 185 07/02/2021 1241   PLT 106 (L) 02/05/2013 2355   MCV 87.3 07/02/2021 1241   MCV 93 02/05/2013 2355   MCH 30.6 07/02/2021 1241   MCHC 35.1 07/02/2021 1241   RDW 13.0 07/02/2021 1241   RDW 13.5 02/05/2013 2355   LYMPHSABS 1,864 02/16/2020 0922   LYMPHSABS 1.8 02/01/2013 1304   MONOABS 0.4 02/01/2013 1304   EOSABS 207 02/16/2020 0922   EOSABS 0.2 02/01/2013 1304   BASOSABS 30 02/16/2020 0922   BASOSABS 0.0 02/01/2013 1304    Hgb A1C Lab Results  Component Value Date   HGBA1C 5.9 (H) 04/23/2021            Assessment & Plan:    Encouraged him to get a flu shot in the fall He declines tetanus for financial reasons, advised him if he gets bit or cut to go get this done Encouraged him to get his COVID booster Pneumovax today Discussed Shingrix vaccine, he will check coverage with his insurance company and get this done at the pharmacy if he would like to have this done Colon screening UTD Encouraged him to consume a balanced diet and exercise regimen Advised him to see an eye doctor and dentist annually We will check CBC, c-Met, lipid, A1c, urine microalbumin and PSA today  Decreased Hearing:  Audiometry: decreased at multiple hertz Referral to audiology placed  RTC in 6 months, follow-up chronic conditions Webb Silversmith, NP

## 2021-10-28 NOTE — Assessment & Plan Note (Signed)
Will have him start baby ASA

## 2021-10-28 NOTE — Addendum Note (Signed)
Addended by: Ashley Royalty E on: 10/28/2021 02:43 PM   Modules accepted: Orders

## 2021-10-29 LAB — CBC
HCT: 36 % — ABNORMAL LOW (ref 38.5–50.0)
Hemoglobin: 12.1 g/dL — ABNORMAL LOW (ref 13.2–17.1)
MCH: 29.5 pg (ref 27.0–33.0)
MCHC: 33.6 g/dL (ref 32.0–36.0)
MCV: 87.8 fL (ref 80.0–100.0)
MPV: 10 fL (ref 7.5–12.5)
Platelets: 208 10*3/uL (ref 140–400)
RBC: 4.1 10*6/uL — ABNORMAL LOW (ref 4.20–5.80)
RDW: 14.5 % (ref 11.0–15.0)
WBC: 7.5 10*3/uL (ref 3.8–10.8)

## 2021-10-29 LAB — COMPLETE METABOLIC PANEL WITH GFR
AG Ratio: 1.4 (calc) (ref 1.0–2.5)
ALT: 22 U/L (ref 9–46)
AST: 23 U/L (ref 10–35)
Albumin: 4.4 g/dL (ref 3.6–5.1)
Alkaline phosphatase (APISO): 72 U/L (ref 35–144)
BUN/Creatinine Ratio: 15 (calc) (ref 6–22)
BUN: 23 mg/dL (ref 7–25)
CO2: 23 mmol/L (ref 20–32)
Calcium: 9.4 mg/dL (ref 8.6–10.3)
Chloride: 107 mmol/L (ref 98–110)
Creat: 1.52 mg/dL — ABNORMAL HIGH (ref 0.70–1.35)
Globulin: 3.1 g/dL (calc) (ref 1.9–3.7)
Glucose, Bld: 137 mg/dL (ref 65–139)
Potassium: 5 mmol/L (ref 3.5–5.3)
Sodium: 140 mmol/L (ref 135–146)
Total Bilirubin: 0.7 mg/dL (ref 0.2–1.2)
Total Protein: 7.5 g/dL (ref 6.1–8.1)
eGFR: 50 mL/min/{1.73_m2} — ABNORMAL LOW (ref >=60)

## 2021-10-29 LAB — PSA: PSA: 0.79 ng/mL (ref <=4.00)

## 2021-10-29 LAB — LIPID PANEL
Cholesterol: 106 mg/dL (ref <200)
HDL: 36 mg/dL — ABNORMAL LOW (ref >=40)
LDL Cholesterol (Calc): 39 mg/dL (calc)
Non-HDL Cholesterol (Calc): 70 mg/dL (calc) (ref <130)
Total CHOL/HDL Ratio: 2.9 (calc) (ref <5.0)
Triglycerides: 263 mg/dL — ABNORMAL HIGH (ref <150)

## 2021-10-29 LAB — HEMOGLOBIN A1C
Hgb A1c MFr Bld: 7 % of total Hgb — ABNORMAL HIGH (ref <5.7)
Mean Plasma Glucose: 154 mg/dL
eAG (mmol/L): 8.5 mmol/L

## 2021-10-29 LAB — MICROALBUMIN / CREATININE URINE RATIO
Creatinine, Urine: 376 mg/dL — ABNORMAL HIGH (ref 20–320)
Microalb Creat Ratio: 19 mcg/mg creat (ref <30)
Microalb, Ur: 7.3 mg/dL

## 2021-10-31 MED ORDER — ATORVASTATIN CALCIUM 80 MG PO TABS: 80.0000 mg | ORAL_TABLET | Freq: Every day | ORAL | 1 refills | Status: DC

## 2021-10-31 NOTE — Addendum Note (Signed)
Addended by: Jearld Fenton on: 10/31/2021 08:05 AM   Modules accepted: Orders

## 2021-11-13 DIAGNOSIS — H903 Sensorineural hearing loss, bilateral: Secondary | ICD-10-CM | POA: Diagnosis not present

## 2021-11-13 DIAGNOSIS — H905 Unspecified sensorineural hearing loss: Secondary | ICD-10-CM | POA: Diagnosis not present

## 2021-12-11 ENCOUNTER — Ambulatory Visit
Admission: RE | Admit: 2021-12-11 | Discharge: 2021-12-11 | Disposition: A | Discharge status: Home / Self Care | Payer: Medicare Other | Source: Ambulatory Visit | Attending: Acute Care | Admitting: Acute Care

## 2021-12-11 DIAGNOSIS — Z87891 Personal history of nicotine dependence: Secondary | ICD-10-CM | POA: Insufficient documentation

## 2021-12-13 ENCOUNTER — Other Ambulatory Visit: Payer: Self-pay

## 2021-12-13 DIAGNOSIS — Z122 Encounter for screening for malignant neoplasm of respiratory organs: Secondary | ICD-10-CM

## 2021-12-13 DIAGNOSIS — Z87891 Personal history of nicotine dependence: Secondary | ICD-10-CM

## 2021-12-23 ENCOUNTER — Ambulatory Visit: Payer: Self-pay

## 2021-12-23 NOTE — Telephone Encounter (Signed)
Summary: diff urinating   PT called saying he has been having some difficulty urinating the last couple of days.  There are co appts today at [facility].   CB@  [phone]      Chief Complaint: urination is difficult to start Symptoms: urinating small amounts of flow and having to go several times to empty bladder Frequency: Friday Pertinent Negatives: Patient denies burning, flank pain, fever, blood in urine, Disposition: '[]'$ ED /'[]'$ Urgent Care (no appt availability in office) / '[x]'$ Appointment(In office/virtual)/ '[]'$  [facility]/ '[]'$ Home Care/ '[]'$ Refused Recommended Disposition /'[]'$ [facility]/ '[]'$  Follow-up with PCP Additional Notes: Appt in office/pt placed on waitlist  Reason for Disposition  Urination is difficult to start (i.e., hesitancy) or straining  Answer Assessment - Initial Assessment Questions 1. SYMPTOM: "What's the main symptom you're concerned about?" (e.g., frequency, incontinence)     Urination frequently  2. ONSET: "When did the sx  start?"     Friday 3. PAIN: "Is there any pain?" If Yes, ask: "How bad is it?" (Scale: 1-10; mild, moderate, severe)     no 4. CAUSE: "What do you think is causing the symptoms?"     unsure 5. OTHER SYMPTOMS: "Do you have any other symptoms?" (e.g., blood in urine, fever, flank pain, pain with urination)     no  Protocols used: Urinary Symptoms-A-AH

## 2021-12-31 ENCOUNTER — Encounter: Payer: Self-pay | Admitting: Internal Medicine

## 2021-12-31 ENCOUNTER — Ambulatory Visit (INDEPENDENT_AMBULATORY_CARE_PROVIDER_SITE_OTHER): Payer: Medicare Other | Admitting: Internal Medicine

## 2021-12-31 VITALS — BP 126/72 | HR 91 | Temp 97.5°F | Wt 152.0 lb

## 2021-12-31 DIAGNOSIS — R3911 Hesitancy of micturition: Secondary | ICD-10-CM

## 2021-12-31 DIAGNOSIS — N401 Enlarged prostate with lower urinary tract symptoms: Secondary | ICD-10-CM | POA: Diagnosis not present

## 2021-12-31 MED ORDER — TAMSULOSIN HCL 0.4 MG PO CAPS: 0.4000 mg | ORAL_CAPSULE | Freq: Every day | ORAL | 1 refills | Status: DC

## 2021-12-31 NOTE — Patient Instructions (Signed)

## 2021-12-31 NOTE — Progress Notes (Signed)
Subjective:    Patient ID: [name], male    DOB: October 27, 1955, 66 y.o.   MRN: [id number]  HPI  Patient presents to clinic today with complaint of urinary hesitancy.  This started 4 days ago.  He was having some suprapubic pain but this has resolved.  He denies urinary urgency, frequency, dysuria or blood in his urine.  He denies testicular pain or swelling.  He has tried cranberry juice OTC with some relief of symptoms.  Review of Systems     Past Medical History:  Diagnosis Date   Anxiety    COPD (chronic obstructive pulmonary disease) (HCC)    Diabetes mellitus without complication (HCC)    GERD (gastroesophageal reflux disease)    Hypertension    Insomnia    Personal history of tobacco use, presenting hazards to health 05/31/2015    Current Outpatient Medications  Medication Sig Dispense Refill   ACCU-CHEK GUIDE test strip TEST SUGAR UP TO TWICE DAILY 200 each 3   Accu-Chek Softclix Lancets lancets TEST BLOOD SUGAR UP TO TWICE DAILY 200 each 3   albuterol (PROVENTIL) (2.5 MG/3ML) 0.083% nebulizer solution Take 3 mLs (2.5 mg total) by nebulization every 6 (six) hours as needed for wheezing or shortness of breath. 75 mL 1   aspirin EC 81 MG tablet Take 1 tablet (81 mg total) by mouth daily. Swallow whole. 30 tablet 12   atorvastatin (LIPITOR) 80 MG tablet Take 1 tablet (80 mg total) by mouth daily. 90 tablet 1   Blood Glucose Monitoring Suppl (ACCU-CHEK GUIDE ME) w/Device KIT Use to check blood sugar up to 2 times daily 1 kit 0   busPIRone (BUSPAR) 5 MG tablet Take 1 tablet (5 mg total) by mouth 2 (two) times daily. 180 tablet 1   ezetimibe (ZETIA) 10 MG tablet TAKE 1 TABLET BY MOUTH ONCE DAILY 90 tablet 2   lisinopril (ZESTRIL) 20 MG tablet TAKE 1 TABLET BY MOUTH ONCE DAILY HIGH BLOOD PRESSURE 90 tablet 0   metFORMIN (GLUCOPHAGE) 1000 MG tablet TAKE 1 TABLET BY MOUTH TWICE DAILY WITH FOOD FOR DIABETES 180 tablet 0   omeprazole (PRILOSEC) 20 MG capsule TAKE 1 CAPSULE BY  MOUTH ONCE DAILY 90 capsule 0   ULTRACARE PEN NEEDLES 32G X 5 MM MISC USE AS DIRECTED. WITH INJECT 0.375MLS (0.5MG TOTAL) SUBCUTANEOUSLY ONCE A WEEK 90 each 0   No current facility-administered medications for this visit.    Allergies  Allergen Reactions   Codeine     Other reaction(s): Vomiting    Family History  Problem Relation Age of Onset   Renal Disease Mother 49   Heart disease Father    Stroke Father    Alzheimer's disease Father    Stroke Brother    Diabetes Brother    Diabetes Maternal Grandfather     Social History   Socioeconomic History   Marital status: Divorced    Spouse name: Not on file   Number of children: 3   Years of education: Not on file   Highest education level: 10th grade  Occupational History   Occupation: retired  Tobacco Use   Smoking status: Former    Packs/day: 1.00    Years: 40.00    Total pack years: 40.00    Types: Cigarettes    Quit date: 07/25/2018    Years since quitting: 3.4   Smokeless tobacco: Current  Vaping Use   Vaping Use: Never used  Substance and Sexual Activity   Alcohol use: Not Currently  Comment: quit 2003, drank about 5 beer per week prior   Drug use: Never   Sexual activity: Yes    Birth control/protection: None  Other Topics Concern   Not on file  Social History Narrative   Not on file   Social Determinants of Health   Financial Resource Strain: Low Risk  (10/11/2021)   Overall Financial Resource Strain (CARDIA)    Difficulty of Paying Living Expenses: Not hard at all  Food Insecurity: No Food Insecurity (10/11/2021)   Hunger Vital Sign    Worried About Running Out of Food in the Last Year: Never true    Ran Out of Food in the Last Year: Never true  Transportation Needs: No Transportation Needs (10/11/2021)   PRAPARE - Hydrologist (Medical): No    Lack of Transportation (Non-Medical): No  Physical Activity: Inactive (10/11/2021)   Exercise Vital Sign    Days of Exercise  per Week: 0 days    Minutes of Exercise per Session: 0 min  Stress: No Stress Concern Present (10/11/2021)   [place]    Feeling of Stress : Not at all  Social Connections: Socially Isolated (10/11/2021)   Social Connection and Isolation Panel [NHANES]    Frequency of Communication with Friends and Family: More than three times a week    Frequency of Social Gatherings with Friends and Family: Three times a week    Attends Religious Services: Never    Active Member of Clubs or Organizations: No    Attends Archivist Meetings: Never    Marital Status: Divorced  Human resources officer Violence: Not At Risk (10/11/2021)   Humiliation, Afraid, Rape, and Kick questionnaire    Fear of Current or Ex-Partner: No    Emotionally Abused: No    Physically Abused: No    Sexually Abused: No     Constitutional: Denies fever, malaise, fatigue, headache or abrupt weight changes.  Respiratory: Denies difficulty breathing, shortness of breath, cough or sputum production.   Cardiovascular: Denies chest pain, chest tightness, palpitations or swelling in the hands or feet.  Gastrointestinal: Denies abdominal pain, bloating, constipation, diarrhea or blood in the stool.  GU: Patient reports urinary hesitancy.  Denies urgency, frequency, pain with urination, burning sensation, blood in urine, odor or discharge.  No other specific complaints in a complete review of systems (except as listed in HPI above).  Objective:   Physical Exam  BP 126/72 (BP Location: Left Arm, Patient Position: Sitting, Cuff Size: Normal)   Pulse 91   Temp (!) 97.5 F (36.4 C) (Temporal)   Wt 152 lb (68.9 kg)   SpO2 97%   BMI 24.53 kg/m   Wt Readings from Last 3 Encounters:  10/28/21 151 lb (68.5 kg)  07/03/21 141 lb (64 kg)  07/02/21 140 lb (63.5 kg)    General: Appears as stated age, well developed, well nourished in NAD. Cardiovascular: Normal rate  and rhythm. S1,S2 noted.  No murmur, rubs or gallops noted.  Pulmonary/Chest: Normal effort and positive vesicular breath sounds. No respiratory distress. No wheezes, rales or ronchi noted.  Abdomen: Soft and nontender. Normal bowel sounds. No distention or masses noted.  Rectal: Normal rectal tone.  Enlarged prostate without nodules.  BMET    Component Value Date/Time   NA 140 10/28/2021 1348   NA 137 02/05/2013 2355   K 5.0 10/28/2021 1348   K 3.8 02/05/2013 2355   CL 107 10/28/2021 1348  CL 107 02/05/2013 2355   CO2 23 10/28/2021 1348   CO2 23 02/05/2013 2355   GLUCOSE 137 10/28/2021 1348   GLUCOSE 113 (H) 02/05/2013 2355   BUN 23 10/28/2021 1348   BUN 9 02/05/2013 2355   CREATININE 1.52 (H) 10/28/2021 1348   CALCIUM 9.4 10/28/2021 1348   CALCIUM 8.9 02/05/2013 2355   GFRNONAA >60 07/02/2021 1241   GFRNONAA 61 02/16/2020 0922   GFRAA 70 02/16/2020 0922    Lipid Panel     Component Value Date/Time   CHOL 106 10/28/2021 1348   TRIG 263 (H) 10/28/2021 1348   HDL 36 (L) 10/28/2021 1348   CHOLHDL 2.9 10/28/2021 1348   LDLCALC 39 10/28/2021 1348    CBC    Component Value Date/Time   WBC 7.5 10/28/2021 1348   RBC 4.10 (L) 10/28/2021 1348   HGB 12.1 (L) 10/28/2021 1348   HGB 13.0 02/05/2013 2355   HCT 36.0 (L) 10/28/2021 1348   HCT 36.7 (L) 02/05/2013 2355   PLT 208 10/28/2021 1348   PLT 106 (L) 02/05/2013 2355   MCV 87.8 10/28/2021 1348   MCV 93 02/05/2013 2355   MCH 29.5 10/28/2021 1348   MCHC 33.6 10/28/2021 1348   RDW 14.5 10/28/2021 1348   RDW 13.5 02/05/2013 2355   LYMPHSABS 1,864 02/16/2020 0922   LYMPHSABS 1.8 02/01/2013 1304   MONOABS 0.4 02/01/2013 1304   EOSABS 207 02/16/2020 0922   EOSABS 0.2 02/01/2013 1304   BASOSABS 30 02/16/2020 0922   BASOSABS 0.0 02/01/2013 1304    Hgb A1C Lab Results  Component Value Date   HGBA1C 7.0 (H) 10/28/2021           Assessment & Plan:    RTC in 3 months for follow-up of chronic conditions Webb Silversmith, NP

## 2021-12-31 NOTE — Progress Notes (Deleted)
  Subjective:     Patient ID: [name], male   DOB: 01/17/1956, 66 y.o.   MRN: [id number]  HPI   Review of Systems     Objective:   Physical Exam     Assessment:     ***    Plan:     ***

## 2021-12-31 NOTE — Assessment & Plan Note (Signed)
Will trial Flomax 0.4 mg daily

## 2022-01-22 ENCOUNTER — Other Ambulatory Visit: Payer: Self-pay | Admitting: Internal Medicine

## 2022-01-22 DIAGNOSIS — E1169 Type 2 diabetes mellitus with other specified complication: Secondary | ICD-10-CM

## 2022-01-22 DIAGNOSIS — K219 Gastro-esophageal reflux disease without esophagitis: Secondary | ICD-10-CM

## 2022-01-22 DIAGNOSIS — Z794 Long term (current) use of insulin: Secondary | ICD-10-CM

## 2022-01-22 DIAGNOSIS — I10 Essential (primary) hypertension: Secondary | ICD-10-CM

## 2022-01-22 NOTE — Telephone Encounter (Signed)
Requested Prescriptions  Pending Prescriptions Disp Refills  . omeprazole (PRILOSEC) 20 MG capsule [Pharmacy Med Name: OMEPRAZOLE DR 20 MG CAP] 90 capsule 0    Sig: TAKE 1 CAPSULE BY MOUTH ONCE DAILY     Gastroenterology: Proton Pump Inhibitors Passed - 01/22/2022  9:36 AM      Passed - Valid encounter within last 12 months    Recent Outpatient Visits          3 weeks ago Benign prostatic hyperplasia with urinary hesitancy   [facility] [place], Coralie Keens, NP   2 months ago Encounter for general adult medical examination with abnormal findings   [facility] [place], Coralie Keens, NP   6 months ago Other chest pain   [facility] [place], [place], NP   9 months ago Type 2 diabetes mellitus with other specified complication, without long-term current use of insulin [facility])   [facility] [place], Coralie Keens, NP   1 year ago Essential hypertension   [place], NP      Future Appointments            In 3 months Baity, Coralie Keens, NP [facility], [place]           . lisinopril (ZESTRIL) 20 MG tablet [Pharmacy Med Name: LISINOPRIL 20 MG TAB] 90 tablet 0    Sig: TAKE 1 TABLET BY MOUTH ONCE DAILY HIGH BLOOD PRESSURE     Cardiovascular:  ACE Inhibitors Failed - 01/22/2022  9:36 AM      Failed - Cr in normal range and within 180 days    Creat  Date Value Ref Range Status  10/28/2021 1.52 (H) 0.70 - 1.35 mg/dL Final   Creatinine, Urine  Date Value Ref Range Status  10/28/2021 376 (H) 20 - 320 mg/dL Final         Passed - K in normal range and within 180 days    Potassium  Date Value Ref Range Status  10/28/2021 5.0 3.5 - 5.3 mmol/L Final  02/05/2013 3.8 3.5 - 5.1 mmol/L Final         Passed - Patient is not pregnant      Passed - Last BP in normal range    BP Readings from Last 1 Encounters:  12/31/21 126/72         Passed - Valid encounter within last 6 months    Recent Outpatient  Visits          3 weeks ago Benign prostatic hyperplasia with urinary hesitancy   [facility] [place], Coralie Keens, NP   2 months ago Encounter for general adult medical examination with abnormal findings   [facility] [place], Coralie Keens, NP   6 months ago Other chest pain   [facility] [place], [place], NP   9 months ago Type 2 diabetes mellitus with other specified complication, without long-term current use of insulin [facility])   [facility] [place], Coralie Keens, NP   1 year ago Essential hypertension   [place], Coralie Keens, NP      Future Appointments            In 3 months Baity, Coralie Keens, NP [facility], [place]           . metFORMIN (GLUCOPHAGE) 1000 MG tablet [Pharmacy Med Name: METFORMIN HCL 1000 MG TAB] 180  tablet 0    Sig: TAKE 1 TABLET BY MOUTH TWICE DAILY WITH FOOD FOR DIABETES     Endocrinology:  Diabetes - Biguanides Failed - 01/22/2022  9:36 AM      Failed - Cr in normal range and within 360 days    Creat  Date Value Ref Range Status  10/28/2021 1.52 (H) 0.70 - 1.35 mg/dL Final   Creatinine, Urine  Date Value Ref Range Status  10/28/2021 376 (H) 20 - 320 mg/dL Final         Failed - eGFR in normal range and within 360 days    GFR, Est African American  Date Value Ref Range Status  02/16/2020 70 > OR = 60 mL/min/1.53m Final   GFR, Est Non African American  Date Value Ref Range Status  02/16/2020 61 > OR = 60 mL/min/1.773mFinal   GFR, Estimated  Date Value Ref Range Status  07/02/2021 >60 >60 mL/min Final    Comment:    (NOTE) Calculated using the CKD-EPI Creatinine Equation (2021)    eGFR  Date Value Ref Range Status  10/28/2021 50 (L) > OR = 60 mL/min/1.7347minal    Comment:    The eGFR is based on the CKD-EPI 2021 equation. To calculate  the new eGFR from a previous Creatinine or Cystatin C result, go to  https://www.kidney.org/professionals/ kdoqi/gfr%5Fcalculator          Failed - B12 Level in normal range and within 720 days    No results found for: "VITAMINB12"       Failed - CBC within normal limits and completed in the last 12 months    WBC  Date Value Ref Range Status  10/28/2021 7.5 3.8 - 10.8 Thousand/uL Final   RBC  Date Value Ref Range Status  10/28/2021 4.10 (L) 4.20 - 5.80 Million/uL Final   Hemoglobin  Date Value Ref Range Status  10/28/2021 12.1 (L) 13.2 - 17.1 g/dL Final   HGB  Date Value Ref Range Status  02/05/2013 13.0 13.0 - 18.0 g/dL Final   HCT  Date Value Ref Range Status  10/28/2021 36.0 (L) 38.5 - 50.0 % Final  02/05/2013 36.7 (L) 40.0 - 52.0 % Final   MCHC  Date Value Ref Range Status  10/28/2021 33.6 32.0 - 36.0 g/dL Final   [facility] Value Ref Range Status  10/28/2021 29.5 27.0 - 33.0 pg Final   MCV  Date Value Ref Range Status  10/28/2021 87.8 80.0 - 100.0 fL Final  02/05/2013 93 80 - 100 fL Final   No results found for: "PLTCOUNTKUC", "LABPLAT", "POCPLA" RDW  Date Value Ref Range Status  10/28/2021 14.5 11.0 - 15.0 % Final  02/05/2013 13.5 11.5 - 14.5 % Final         Passed - HBA1C is between 0 and 7.9 and within 180 days    Hgb A1c MFr Bld  Date Value Ref Range Status  10/28/2021 7.0 (H) <5.7 % of total Hgb Final    Comment:    For someone without known diabetes, a hemoglobin A1c value of 6.5% or greater indicates that they may have  diabetes and this should be confirmed with a follow-up  test. . For someone with known diabetes, a value <7% indicates  that their diabetes is well controlled and a value  greater than or equal to 7% indicates suboptimal  control. A1c targets should be individualized based on  duration of diabetes, age, comorbid conditions, and  other considerations. . Currently,  no consensus exists regarding use of hemoglobin A1c for diagnosis of diabetes for children. Renella Cunas - Valid  encounter within last 6 months    Recent Outpatient Visits          3 weeks ago Benign prostatic hyperplasia with urinary hesitancy   [facility] [place], Coralie Keens, NP   2 months ago Encounter for general adult medical examination with abnormal findings   [facility] [place], Coralie Keens, NP   6 months ago Other chest pain   [facility] [place], Coralie Keens, NP   9 months ago Type 2 diabetes mellitus with other specified complication, without long-term current use of insulin ([place])   [facility] [place], Coralie Keens, NP   1 year ago Essential hypertension   [place], Coralie Keens, NP      Future Appointments            In 3 months Baity, Coralie Keens, NP [place]           . hydrochlorothiazide (MICROZIDE) 12.5 MG capsule [Pharmacy Med Name: HYDROCHLOROTHIAZIDE 12.5 MG CAP] 90 capsule 0    Sig: TAKE 1 CAPSULE BY MOUTH ONCE DAILY HIGH BLOOD PRESSURE     Cardiovascular: Diuretics - Thiazide Failed - 01/22/2022  9:36 AM      Failed - Cr in normal range and within 180 days    Creat  Date Value Ref Range Status  10/28/2021 1.52 (H) 0.70 - 1.35 mg/dL Final   Creatinine, Urine  Date Value Ref Range Status  10/28/2021 376 (H) 20 - 320 mg/dL Final         Passed - K in normal range and within 180 days    Potassium  Date Value Ref Range Status  10/28/2021 5.0 3.5 - 5.3 mmol/L Final  02/05/2013 3.8 3.5 - 5.1 mmol/L Final         Passed - Na in normal range and within 180 days    Sodium  Date Value Ref Range Status  10/28/2021 140 135 - 146 mmol/L Final  02/05/2013 137 136 - 145 mmol/L Final         Passed - Last BP in normal range    BP Readings from Last 1 Encounters:  12/31/21 126/72         Passed - Valid encounter within last 6 months    Recent Outpatient Visits          3 weeks ago Benign prostatic hyperplasia with urinary hesitancy   [place], Coralie Keens, NP   2 months ago Encounter for general adult medical examination with abnormal findings   [facility] [place], Coralie Keens, NP   6 months ago Other chest pain   [facility] [place], Coralie Keens, NP   9 months ago Type 2 diabetes mellitus with other specified complication, without long-term current use of insulin [facility])   [facility] [place], Coralie Keens, NP   1 year ago Essential hypertension   [place], Coralie Keens, NP      Future Appointments            In 3 months Baity, Coralie Keens, NP [facility], [place]           . ULTRACARE PEN NEEDLES 32G X 5 MM MISC [Pharmacy Med Name:  ULTRACARE PEN NEEDLES 32G X 5 MM] 90 each 0    Sig: USE AS DIRECTED. WITH INJECT 0.375MLS (0.5MG TOTAL) SUBCUTANEOUSLY ONCE A WEEK     Endocrinology: Diabetes - Testing Supplies Passed - 01/22/2022  9:36 AM      Passed - Valid encounter within last 12 months    Recent Outpatient Visits          3 weeks ago Benign prostatic hyperplasia with urinary hesitancy   [facility] [place], Coralie Keens, NP   2 months ago Encounter for general adult medical examination with abnormal findings   [facility], Coralie Keens, NP   6 months ago Other chest pain   [facility] [place], Coralie Keens, NP   9 months ago Type 2 diabetes mellitus with other specified complication, without long-term current use of insulin ([place])   [facility], Coralie Keens, NP   1 year ago Essential hypertension   [place], Coralie Keens, NP      Future Appointments            In 3 months Baity, Coralie Keens, NP [facility], [place]           . busPIRone (BUSPAR) 5 MG tablet [Pharmacy Med Name: BUSPIRONE HCL 5 MG TAB] 180 tablet 1    Sig: TAKE 1 TABLET BY MOUTH TWICE DAILY     Psychiatry: Anxiolytics/Hypnotics - Non-controlled Passed - 01/22/2022  9:36 AM      Passed - Valid encounter  within last 12 months    Recent Outpatient Visits          3 weeks ago Benign prostatic hyperplasia with urinary hesitancy   [facility] [place], Coralie Keens, NP   2 months ago Encounter for general adult medical examination with abnormal findings   [facility] [place], Coralie Keens, NP   6 months ago Other chest pain   [facility] [place], [place], NP   9 months ago Type 2 diabetes mellitus with other specified complication, without long-term current use of insulin ([place])   [facility], Coralie Keens, NP   1 year ago Essential hypertension   [place], Coralie Keens, NP      Future Appointments            In 3 months Baity, Coralie Keens, NP [facility], [place]           . ezetimibe (ZETIA) 10 MG tablet [Pharmacy Med Name: EZETIMIBE 10 MG TAB] 90 tablet 2    Sig: TAKE 1 TABLET BY MOUTH ONCE DAILY     Cardiovascular:  Antilipid - Sterol Transport Inhibitors Failed - 01/22/2022  9:36 AM      Failed - Lipid Panel in normal range within the last 12 months    Cholesterol  Date Value Ref Range Status  10/28/2021 106 <200 mg/dL Final   LDL Cholesterol (Calc)  Date Value Ref Range Status  10/28/2021 39 mg/dL (calc) Final    Comment:    Reference range: <100 . Desirable range <100 mg/dL for primary prevention;   <70 mg/dL for patients with CHD or diabetic patients  with > or = 2 CHD risk factors. [name] Kitchen LDL-C is now calculated using the Martin-Hopkins  calculation, which is a validated novel method providing  better accuracy than the Friedewald equation in the  estimation of LDL-C.  Hassell Done  SS et al. JAMA. 6701;410(30): 2061-2068  (http://education.[facility].com/faq/FAQ164)    HDL  Date Value Ref Range Status  10/28/2021 36 (L) > OR = 40 mg/dL Final   Triglycerides  Date Value Ref Range Status  10/28/2021 263 (H) <150 mg/dL Final    Comment:    . If a non-fasting specimen was collected,  consider repeat triglyceride testing on a fasting specimen if clinically indicated.  Yates Decamp et al. J. of Clin. Lipidol. 1314;3:888-757. [name] Kitchen          Passed - AST in normal range and within 360 days    AST  Date Value Ref Range Status  10/28/2021 23 10 - 35 U/L Final   SGOT(AST)  Date Value Ref Range Status  02/05/2013 91 (H) 15 - 37 Unit/L Final         Passed - ALT in normal range and within 360 days    ALT  Date Value Ref Range Status  10/28/2021 22 9 - 46 U/L Final   SGPT (ALT)  Date Value Ref Range Status  02/05/2013 144 (H) 12 - 78 U/L Final         Passed - Patient is not pregnant      Passed - Valid encounter within last 12 months    Recent Outpatient Visits          3 weeks ago Benign prostatic hyperplasia with urinary hesitancy   [facility] [place], Coralie Keens, NP   2 months ago Encounter for general adult medical examination with abnormal findings   [facility] [place], Coralie Keens, NP   6 months ago Other chest pain   [facility] [place], [place], NP   9 months ago Type 2 diabetes mellitus with other specified complication, without long-term current use of insulin [facility])   [facility] [place], Coralie Keens, NP   1 year ago Essential hypertension   [place], NP      Future Appointments            In 3 months Baity, Coralie Keens, NP [facility], [facility]

## 2022-01-23 NOTE — Telephone Encounter (Signed)
last RF 10/31/21 #90 1 RF   Requested Prescriptions  Refused Prescriptions Disp Refills  . atorvastatin (LIPITOR) 80 MG tablet [Pharmacy Med Name: ATORVASTATIN CALCIUM 80 MG TAB] 90 tablet 1    Sig: TAKE 1 TABLET BY MOUTH ONCE EVERY EVENING     Cardiovascular:  Antilipid - Statins Failed - 01/22/2022  4:31 PM      Failed - Lipid Panel in normal range within the last 12 months    Cholesterol  Date Value Ref Range Status  10/28/2021 106 <200 mg/dL Final   LDL Cholesterol (Calc)  Date Value Ref Range Status  10/28/2021 39 mg/dL (calc) Final    Comment:    Reference range: <100 . Desirable range <100 mg/dL for primary prevention;   <70 mg/dL for patients with CHD or diabetic patients  with > or = 2 CHD risk factors. [name] Kitchen LDL-C is now calculated using the Martin-Hopkins  calculation, which is a validated novel method providing  better accuracy than the Friedewald equation in the  estimation of LDL-C.  Cresenciano Genre et al. Annamaria Helling. 7893;810(17): 2061-2068  (http://education.[facility].com/faq/FAQ164)    HDL  Date Value Ref Range Status  10/28/2021 36 (L) > OR = 40 mg/dL Final   Triglycerides  Date Value Ref Range Status  10/28/2021 263 (H) <150 mg/dL Final    Comment:    . If a non-fasting specimen was collected, consider repeat triglyceride testing on a fasting specimen if clinically indicated.  Yates Decamp et al. J. of Clin. Lipidol. 5102;5:852-778. [name] Kitchen          Passed - Patient is not pregnant      Passed - Valid encounter within last 12 months    Recent Outpatient Visits          3 weeks ago Benign prostatic hyperplasia with urinary hesitancy   [facility] [place], Coralie Keens, NP   2 months ago Encounter for general adult medical examination with abnormal findings   [facility] [place], Coralie Keens, NP   6 months ago Other chest pain   [facility] [place], [place], NP   9 months ago Type 2 diabetes mellitus with other specified  complication, without long-term current use of insulin [facility])   [facility], Coralie Keens, NP   1 year ago Essential hypertension   [place], NP      Future Appointments            In 3 months Baity, Coralie Keens, NP [facility], [facility]

## 2022-01-30 ENCOUNTER — Ambulatory Visit: Payer: Medicare Other | Admitting: Internal Medicine

## 2022-02-02 IMAGING — CT CT CHEST LUNG CANCER SCREENING LOW DOSE W/O CM
2 of 5 series · 15 of 40 positions shown, 18 images · non-contrast
Comparison: 10/11/2018 screening chest CT.

CLINICAL DATA: 64-year-old asymptomatic male current smoker with 50
pack-year smoking history.

EXAM:
CT CHEST WITHOUT CONTRAST LOW-DOSE FOR LUNG CANCER SCREENING
TECHNIQUE: Multidetector CT imaging of the chest was performed following the
standard protocol without IV contrast.

[Series 4: lung 1.00 · axial · 0.68mm/px · z∈[-1225,-949]mm · 12 of 306 slices shown, 15 images]
[im 15/306  mediastinal]
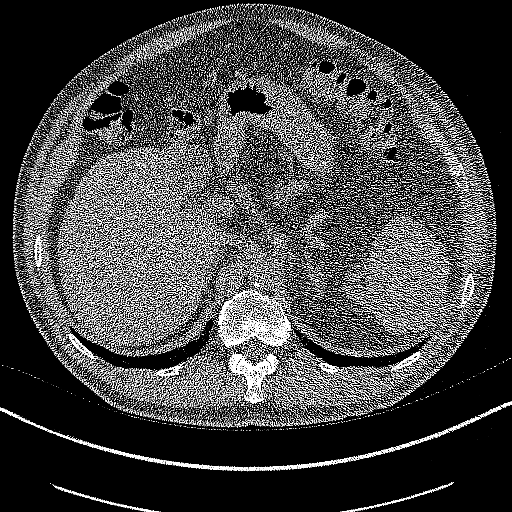
[im 15/306  lung]
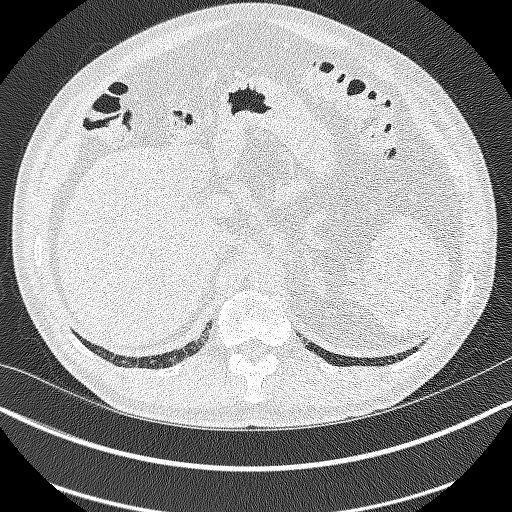
[im 44/306  lung]
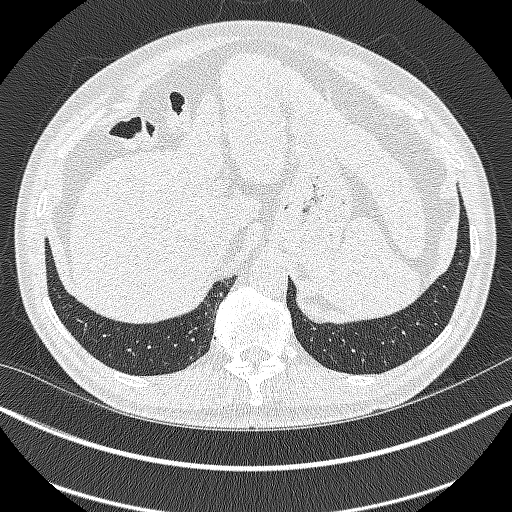
[im 73/306  lung]
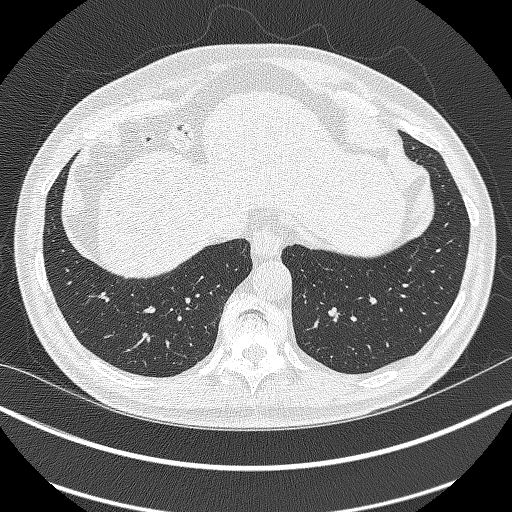
[im 88/306  lung]
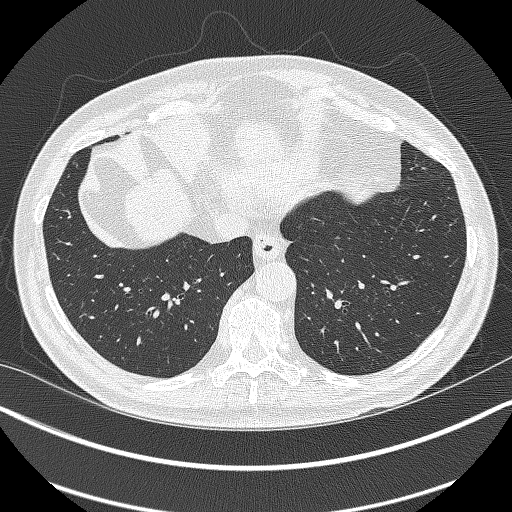
[im 117/306  mediastinal]
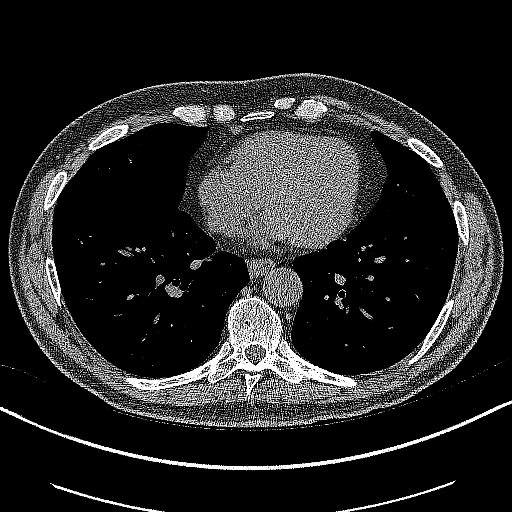
[im 117/306  lung]
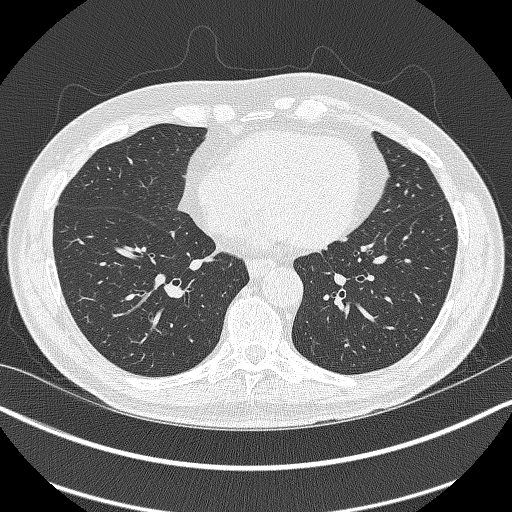
[im 146/306  lung]
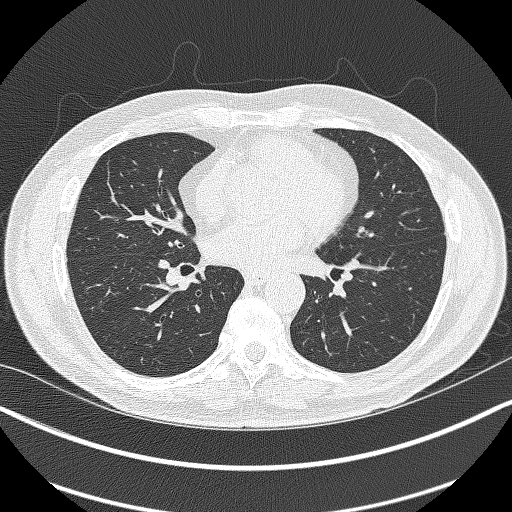
[im 160/306  lung]
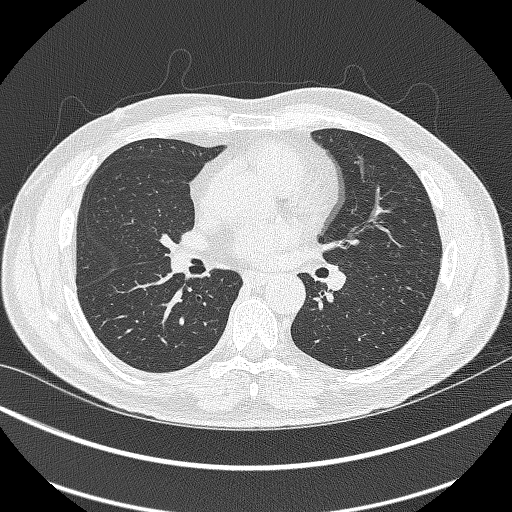
[im 189/306  lung]
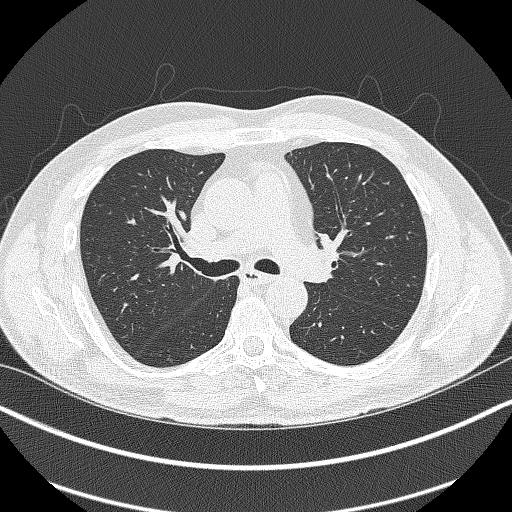
[im 218/306  mediastinal]
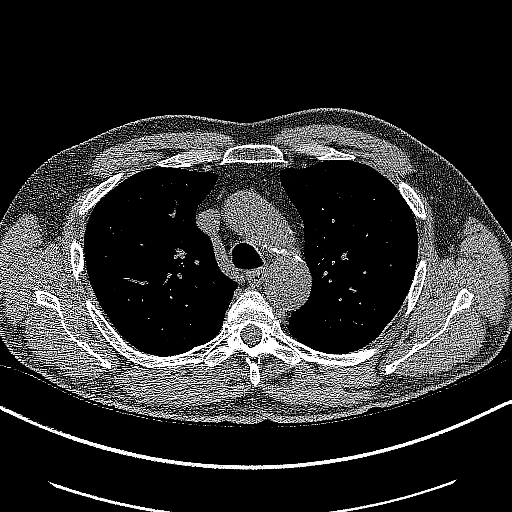
[im 218/306  lung]
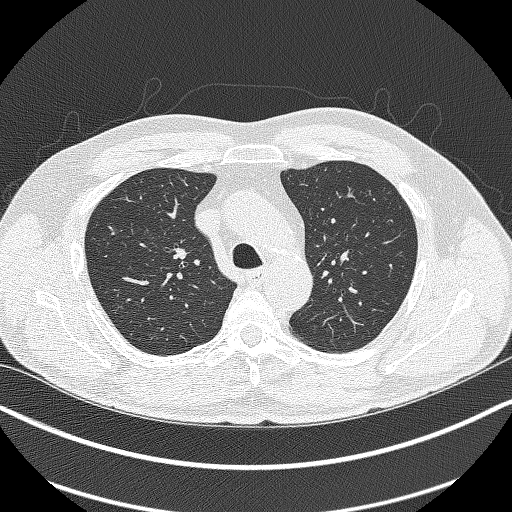
[im 233/306  lung]
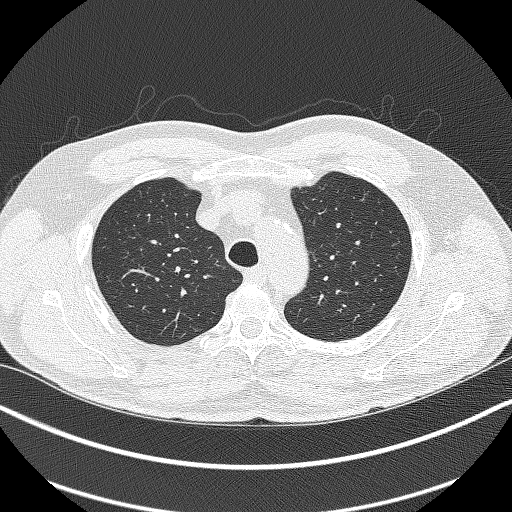
[im 262/306  lung]
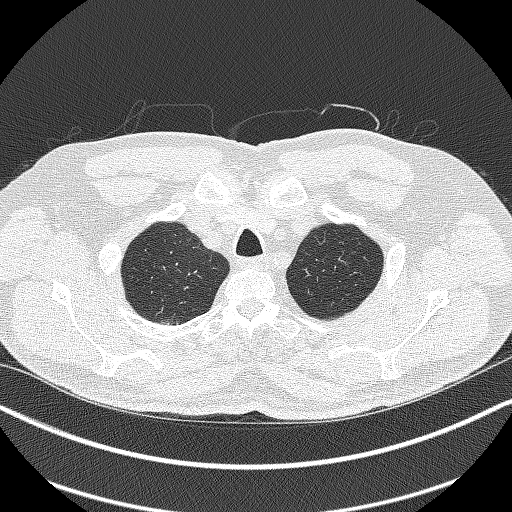
[im 291/306  lung]
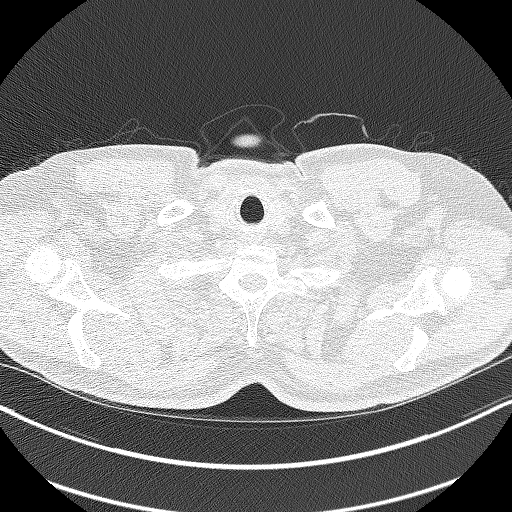

[Series 5: coronals lung 1.00 cor · coronal · 0.60mm/px · 3 of 267 slices shown]
[im 54/267  lung]
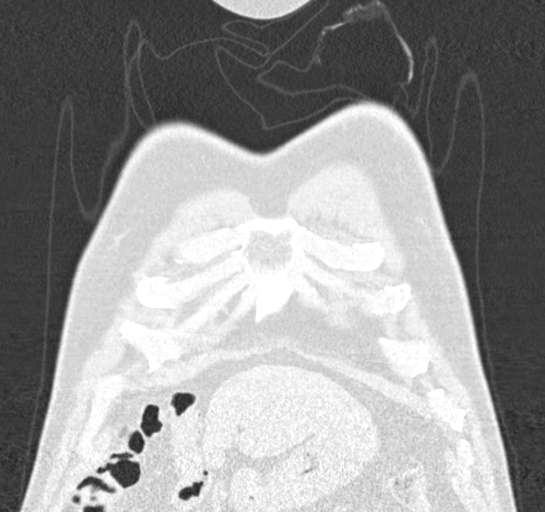
[im 107/267  lung]
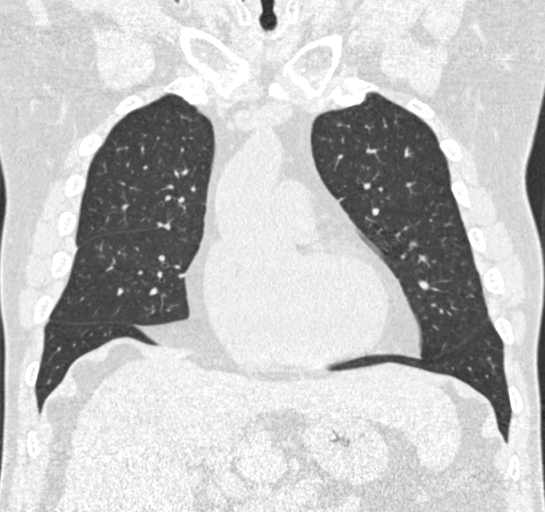
[im 160/267  lung]
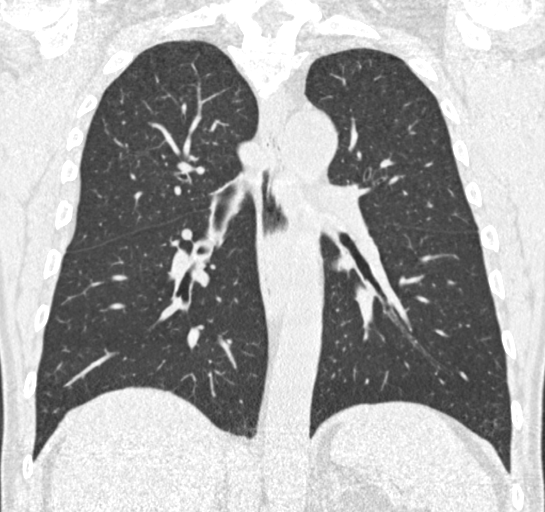

[15 of 40 positions shown; findings below may reference images not displayed]

FINDINGS: Cardiovascular: Normal heart size. No significant pericardial
effusion/thickening. Three-vessel coronary atherosclerosis.
Atherosclerotic nonaneurysmal thoracic aorta. Normal caliber
pulmonary arteries.

Mediastinum/Nodes: No discrete thyroid nodules. Unremarkable
esophagus. No pathologically enlarged axillary, mediastinal or hilar
lymph nodes, noting limited sensitivity for the detection of hilar
adenopathy on this noncontrast study.

Lungs/Pleura: No pneumothorax. No pleural effusion. Minimal
centrilobular emphysema with mild diffuse bronchial wall thickening.
No acute consolidative airspace disease or lung masses. No
significant growth of tiny apical left upper lobe pulmonary nodule.
No new significant pulmonary nodules.

Upper abdomen: Small hiatal hernia. Cholelithiasis. Liver surface
appears slightly irregular, cannot exclude cirrhosis.

Musculoskeletal: No aggressive appearing focal osseous lesions.
Moderate thoracic spondylosis.
IMPRESSION: 1. Lung-RADS 2, benign appearance or behavior. Continue annual
screening with low-dose chest CT without contrast in 12 months.
2. Cholelithiasis.
3. Small hiatal hernia.
4. Liver surface appears slightly irregular, cannot exclude
cirrhosis. Consider hepatic elastography for further liver fibrosis
risk stratification, as clinically warranted.
5. Aortic Atherosclerosis ([id number]-[id number].U) and Emphysema (MI7D6-P66.1).

## 2022-03-04 ENCOUNTER — Telehealth: Payer: Self-pay | Admitting: Internal Medicine

## 2022-03-04 NOTE — Telephone Encounter (Signed)
Patient called and he says he wants his flu shot and has an appointment 04/29/22. Advised he can receive the vaccine on 04/29/22, if he would like. He says yes that's what he wanted to know. Appt notes updated.

## 2022-03-04 NOTE — Telephone Encounter (Signed)
Copied from [place] [phone] [id number]. Topic: Appointment Scheduling - Scheduling Inquiry for Clinic >> Mar 04, 2022 10:16 AM Everette C wrote: Reason for CRM: The patient would like to speak with a member of clinical staff to determine if they need to schedule an appointment for the flu shot   The patient desired to speak with a member of staff prior to scheduling  Please contact further when possible

## 2022-03-21 ENCOUNTER — Ambulatory Visit (INDEPENDENT_AMBULATORY_CARE_PROVIDER_SITE_OTHER): Payer: Medicare Other

## 2022-03-21 DIAGNOSIS — Z23 Encounter for immunization: Secondary | ICD-10-CM

## 2022-03-27 ENCOUNTER — Other Ambulatory Visit: Payer: Self-pay | Admitting: Internal Medicine

## 2022-03-27 NOTE — Telephone Encounter (Signed)
Requested Prescriptions  Pending Prescriptions Disp Refills   tamsulosin (FLOMAX) 0.4 MG CAPS capsule [Pharmacy Med Name: TAMSULOSIN HCL 0.4 MG CAP] 90 capsule 1    Sig: TAKE 1 CAPSULE BY MOUTH ONCE DAILY 30 MINUTES AFTER LARGEST MEAL.     Urology: Alpha-Adrenergic Blocker Passed - 03/27/2022 12:53 PM      Passed - PSA in normal range and within 360 days    PSA  Date Value Ref Range Status  10/28/2021 0.79 < OR = 4.00 ng/mL Final    Comment:    The total PSA value from this assay system is  standardized against the WHO standard. The test  result will be approximately 20% lower when compared  to the equimolar-standardized total PSA (Beckman  Coulter). Comparison of serial PSA results should be  interpreted with this fact in mind. . This test was performed using the Siemens  chemiluminescent method. Values obtained from  different assay methods cannot be used interchangeably. PSA levels, regardless of value, should not be interpreted as absolute evidence of the presence or absence of disease.          Passed - Last BP in normal range    BP Readings from Last 1 Encounters:  12/31/21 126/72         Passed - Valid encounter within last 12 months    Recent Outpatient Visits           2 months ago Benign prostatic hyperplasia with urinary hesitancy   [place], Coralie Keens, NP   5 months ago Encounter for general adult medical examination with abnormal findings   [facility] [place], Coralie Keens, NP   8 months ago Other chest pain   [facility] [place], [place], NP   11 months ago Type 2 diabetes mellitus with other specified complication, without long-term current use of insulin [facility])   [facility], Coralie Keens, NP   1 year ago Essential hypertension   [place], Coralie Keens, NP       Future Appointments             In 1 month Baity, Coralie Keens, NP [facility], [facility]

## 2022-04-15 ENCOUNTER — Other Ambulatory Visit: Payer: Self-pay | Admitting: Internal Medicine

## 2022-04-15 DIAGNOSIS — E1169 Type 2 diabetes mellitus with other specified complication: Secondary | ICD-10-CM

## 2022-04-15 DIAGNOSIS — K219 Gastro-esophageal reflux disease without esophagitis: Secondary | ICD-10-CM

## 2022-04-15 DIAGNOSIS — I10 Essential (primary) hypertension: Secondary | ICD-10-CM

## 2022-04-15 NOTE — Telephone Encounter (Signed)
Appointment- 04/28/22- will need RF Requested Prescriptions  Pending Prescriptions Disp Refills   metFORMIN (GLUCOPHAGE) 1000 MG tablet [Pharmacy Med Name: METFORMIN HCL 1000 MG TAB] 180 tablet 0    Sig: TAKE 1 TABLET BY MOUTH TWICE DAILY WITH FOOD FOR DIABETES     Endocrinology:  Diabetes - Biguanides Failed - 04/15/2022  9:38 AM      Failed - Cr in normal range and within 360 days    Creat  Date Value Ref Range Status  10/28/2021 1.52 (H) 0.70 - 1.35 mg/dL Final   Creatinine, Urine  Date Value Ref Range Status  10/28/2021 376 (H) 20 - 320 mg/dL Final         Failed - eGFR in normal range and within 360 days    GFR, Est African American  Date Value Ref Range Status  02/16/2020 70 > OR = 60 mL/min/1.35m Final   GFR, Est Non African American  Date Value Ref Range Status  02/16/2020 61 > OR = 60 mL/min/1.766mFinal   GFR, Estimated  Date Value Ref Range Status  07/02/2021 >60 >60 mL/min Final    Comment:    (NOTE) Calculated using the CKD-EPI Creatinine Equation (2021)    eGFR  Date Value Ref Range Status  10/28/2021 50 (L) > OR = 60 mL/min/1.7329minal    Comment:    The eGFR is based on the CKD-EPI 2021 equation. To calculate  the new eGFR from a previous Creatinine or Cystatin C result, go to https://www.kidney.org/professionals/ kdoqi/gfr%5Fcalculator          Failed - B12 Level in normal range and within 720 days    No results found for: "VITAMINB12"       Failed - CBC within normal limits and completed in the last 12 months    WBC  Date Value Ref Range Status  10/28/2021 7.5 3.8 - 10.8 Thousand/uL Final   RBC  Date Value Ref Range Status  10/28/2021 4.10 (L) 4.20 - 5.80 Million/uL Final   Hemoglobin  Date Value Ref Range Status  10/28/2021 12.1 (L) 13.2 - 17.1 g/dL Final   HGB  Date Value Ref Range Status  02/05/2013 13.0 13.0 - 18.0 g/dL Final   HCT  Date Value Ref Range Status  10/28/2021 36.0 (L) 38.5 - 50.0 % Final  02/05/2013 36.7 (L) 40.0  - 52.0 % Final   MCHC  Date Value Ref Range Status  10/28/2021 33.6 32.0 - 36.0 g/dL Final   [facility] Range Status  10/28/2021 29.5 27.0 - 33.0 pg Final   MCV  Date Value Ref Range Status  10/28/2021 87.8 80.0 - 100.0 fL Final  02/05/2013 93 80 - 100 fL Final   No results found for: "PLTCOUNTKUC", "LABPLAT", "POCPLA" RDW  Date Value Ref Range Status  10/28/2021 14.5 11.0 - 15.0 % Final  02/05/2013 13.5 11.5 - 14.5 % Final         Passed - HBA1C is between 0 and 7.9 and within 180 days    Hgb A1c MFr Bld  Date Value Ref Range Status  10/28/2021 7.0 (H) <5.7 % of total Hgb Final    Comment:    For someone without known diabetes, a hemoglobin A1c value of 6.5% or greater indicates that they may have  diabetes and this should be confirmed with a follow-up  test. . For someone with known diabetes, a value <7% indicates  that their diabetes is well controlled and a value  greater than  or equal to 7% indicates suboptimal  control. A1c targets should be individualized based on  duration of diabetes, age, comorbid conditions, and  other considerations. . Currently, no consensus exists regarding use of hemoglobin A1c for diagnosis of diabetes for children. Renella Cunas - Valid encounter within last 6 months    Recent Outpatient Visits           3 months ago Benign prostatic hyperplasia with urinary hesitancy   [facility] [place], Coralie Keens, NP   5 months ago Encounter for general adult medical examination with abnormal findings   [facility] [place], Coralie Keens, NP   9 months ago Other chest pain   [facility] [place], Coralie Keens, NP   11 months ago Type 2 diabetes mellitus with other specified complication, without long-term current use of insulin ([place])   [facility] [place], Coralie Keens, NP   1 year ago Essential hypertension   [place], Coralie Keens, NP       Future  Appointments             In 2 weeks Garnette Gunner, Coralie Keens, NP [facility]             lisinopril (ZESTRIL) 20 MG tablet [Pharmacy Med Name: LISINOPRIL 20 MG TAB] 90 tablet 0    Sig: TAKE 1 TABLET BY MOUTH ONCE DAILY HIGH BLOOD PRESSURE     Cardiovascular:  ACE Inhibitors Failed - 04/15/2022  9:38 AM      Failed - Cr in normal range and within 180 days    Creat  Date Value Ref Range Status  10/28/2021 1.52 (H) 0.70 - 1.35 mg/dL Final   Creatinine, Urine  Date Value Ref Range Status  10/28/2021 376 (H) 20 - 320 mg/dL Final         Passed - K in normal range and within 180 days    Potassium  Date Value Ref Range Status  10/28/2021 5.0 3.5 - 5.3 mmol/L Final  02/05/2013 3.8 3.5 - 5.1 mmol/L Final         Passed - Patient is not pregnant      Passed - Last BP in normal range    BP Readings from Last 1 Encounters:  12/31/21 126/72         Passed - Valid encounter within last 6 months    Recent Outpatient Visits           3 months ago Benign prostatic hyperplasia with urinary hesitancy   [place], Coralie Keens, NP   5 months ago Encounter for general adult medical examination with abnormal findings   [facility] [place], Coralie Keens, NP   9 months ago Other chest pain   [facility] [place], [place], NP   11 months ago Type 2 diabetes mellitus with other specified complication, without long-term current use of insulin [facility])   [facility] [place], Coralie Keens, NP   1 year ago Essential hypertension   [place], Coralie Keens, NP       Future Appointments             In 2 weeks Garnette Gunner, Coralie Keens, NP [facility], [facility]             hydrochlorothiazide (MICROZIDE) 12.5 MG capsule [Pharmacy Med Name: HYDROCHLOROTHIAZIDE  12.5 MG CAP] 90 capsule 0    Sig: TAKE 1 CAPSULE BY MOUTH ONCE DAILY HIGH BLOOD PRESSURE     Cardiovascular: Diuretics - Thiazide Failed -  04/15/2022  9:38 AM      Failed - Cr in normal range and within 180 days    Creat  Date Value Ref Range Status  10/28/2021 1.52 (H) 0.70 - 1.35 mg/dL Final   Creatinine, Urine  Date Value Ref Range Status  10/28/2021 376 (H) 20 - 320 mg/dL Final         Passed - K in normal range and within 180 days    Potassium  Date Value Ref Range Status  10/28/2021 5.0 3.5 - 5.3 mmol/L Final  02/05/2013 3.8 3.5 - 5.1 mmol/L Final         Passed - Na in normal range and within 180 days    Sodium  Date Value Ref Range Status  10/28/2021 140 135 - 146 mmol/L Final  02/05/2013 137 136 - 145 mmol/L Final         Passed - Last BP in normal range    BP Readings from Last 1 Encounters:  12/31/21 126/72         Passed - Valid encounter within last 6 months    Recent Outpatient Visits           3 months ago Benign prostatic hyperplasia with urinary hesitancy   [place], Coralie Keens, NP   5 months ago Encounter for general adult medical examination with abnormal findings   [facility] [place], Coralie Keens, NP   9 months ago Other chest pain   [facility] [place], [place], NP   11 months ago Type 2 diabetes mellitus with other specified complication, without long-term current use of insulin ([place])   [facility], Coralie Keens, NP   1 year ago Essential hypertension   [place], Coralie Keens, NP       Future Appointments             In 2 weeks Garnette Gunner, Coralie Keens, NP [facility], [facility]             omeprazole (PRILOSEC) 20 MG capsule [Pharmacy Med Name: OMEPRAZOLE DR 20 MG CAP] 90 capsule 0    Sig: TAKE 1 CAPSULE BY MOUTH ONCE DAILY     Gastroenterology: Proton Pump Inhibitors Passed - 04/15/2022  9:38 AM      Passed - Valid encounter within last 12 months    Recent Outpatient Visits           3 months ago Benign prostatic hyperplasia with urinary hesitancy   [facility] [place], Coralie Keens, NP   5 months ago Encounter for general adult medical examination with abnormal findings   [facility] [place], Coralie Keens, NP   9 months ago Other chest pain   [facility] [place], [place], NP   11 months ago Type 2 diabetes mellitus with other specified complication, without long-term current use of insulin [facility])   [facility] [place], Coralie Keens, NP   1 year ago Essential hypertension   [place], Coralie Keens, NP       Future Appointments             In 2 weeks Garnette Gunner, Coralie Keens, NP [facility]

## 2022-04-29 ENCOUNTER — Encounter: Payer: Self-pay | Admitting: Internal Medicine

## 2022-04-29 ENCOUNTER — Ambulatory Visit
Admission: RE | Admit: 2022-04-29 | Discharge: 2022-04-29 | Disposition: A | Discharge status: Home / Self Care | Payer: Medicare Other | Source: Ambulatory Visit | Attending: Internal Medicine | Admitting: Internal Medicine

## 2022-04-29 ENCOUNTER — Ambulatory Visit
Admission: RE | Admit: 2022-04-29 | Discharge: 2022-04-29 | Disposition: A | Discharge status: Home / Self Care | Payer: Medicare Other | Attending: Internal Medicine | Admitting: Internal Medicine

## 2022-04-29 ENCOUNTER — Ambulatory Visit (INDEPENDENT_AMBULATORY_CARE_PROVIDER_SITE_OTHER): Payer: Medicare Other | Admitting: Internal Medicine

## 2022-04-29 VITALS — BP 118/74 | HR 92 | Temp 97.1°F | Wt 155.0 lb

## 2022-04-29 DIAGNOSIS — E782 Mixed hyperlipidemia: Secondary | ICD-10-CM | POA: Diagnosis not present

## 2022-04-29 DIAGNOSIS — K219 Gastro-esophageal reflux disease without esophagitis: Secondary | ICD-10-CM | POA: Diagnosis not present

## 2022-04-29 DIAGNOSIS — F5101 Primary insomnia: Secondary | ICD-10-CM | POA: Diagnosis not present

## 2022-04-29 DIAGNOSIS — D508 Other iron deficiency anemias: Secondary | ICD-10-CM | POA: Diagnosis not present

## 2022-04-29 DIAGNOSIS — S3992XA Unspecified injury of lower back, initial encounter: Secondary | ICD-10-CM | POA: Insufficient documentation

## 2022-04-29 DIAGNOSIS — E1169 Type 2 diabetes mellitus with other specified complication: Secondary | ICD-10-CM

## 2022-04-29 DIAGNOSIS — I10 Essential (primary) hypertension: Secondary | ICD-10-CM | POA: Diagnosis not present

## 2022-04-29 DIAGNOSIS — N401 Enlarged prostate with lower urinary tract symptoms: Secondary | ICD-10-CM | POA: Diagnosis not present

## 2022-04-29 DIAGNOSIS — R3911 Hesitancy of micturition: Secondary | ICD-10-CM

## 2022-04-29 DIAGNOSIS — B182 Chronic viral hepatitis C: Secondary | ICD-10-CM

## 2022-04-29 DIAGNOSIS — I700 Atherosclerosis of aorta: Secondary | ICD-10-CM

## 2022-04-29 DIAGNOSIS — J410 Simple chronic bronchitis: Secondary | ICD-10-CM

## 2022-04-29 DIAGNOSIS — M533 Sacrococcygeal disorders, not elsewhere classified: Secondary | ICD-10-CM | POA: Diagnosis not present

## 2022-04-29 DIAGNOSIS — G4733 Obstructive sleep apnea (adult) (pediatric): Secondary | ICD-10-CM | POA: Diagnosis not present

## 2022-04-29 DIAGNOSIS — F419 Anxiety disorder, unspecified: Secondary | ICD-10-CM

## 2022-04-29 DIAGNOSIS — I951 Orthostatic hypotension: Secondary | ICD-10-CM

## 2022-04-29 DIAGNOSIS — N1831 Chronic kidney disease, stage 3a: Secondary | ICD-10-CM | POA: Diagnosis not present

## 2022-04-29 LAB — POCT GLYCOSYLATED HEMOGLOBIN (HGB A1C): HbA1c POC (<> result, manual entry): 7.6 % (ref 4.0–5.6)

## 2022-04-29 MED ORDER — TRAMADOL HCL 50 MG PO TABS: 50.0000 mg | ORAL_TABLET | Freq: Three times a day (TID) | ORAL | 0 refills | Status: AC | PRN

## 2022-04-29 MED ORDER — MECLIZINE HCL 25 MG PO TABS: 25.0000 mg | ORAL_TABLET | Freq: Three times a day (TID) | ORAL | 0 refills | Status: DC | PRN

## 2022-04-29 NOTE — Assessment & Plan Note (Addendum)
Noncompliant with CPAP use

## 2022-04-29 NOTE — Assessment & Plan Note (Signed)
Controlled on lisinopril and HCTZ Reinforced DASH diet C-Met today

## 2022-04-29 NOTE — Assessment & Plan Note (Signed)
Continue buspirone Support offered

## 2022-04-29 NOTE — Assessment & Plan Note (Signed)
C-Met today Continue lisinopril for renal protection

## 2022-04-29 NOTE — Assessment & Plan Note (Addendum)
POCT A1C 7.6% Urine microalbumin has been checked within the last year Continue Metformin  Encourage low-carb diet and exercise for weight loss Encouraged routine eye exam Encourage routine foot exam Flu shot today Pneumonia vaccines UTD Encouraged him to get his COVID booster

## 2022-04-29 NOTE — Progress Notes (Signed)
Subjective:    Patient ID: [name], male    DOB: [birth year], 66 y.o.   MRN: [id number]  HPI  Patient presents to clinic today for 51-monthfollow-up of chronic conditions.  HTN: His BP today is 125/72.  He is taking Lisinopril and HCTZ as prescribed.  ECG from 06/2021 reviewed.  HLD with Aortic Atherosclerosis: His last LDL was 39, triglycerides 263, 10/2021.  He denies myalgias on Atorvastatin and Ezetimibe.  He is not taking Aspirin.  He tries to consume a low-fat diet.  DM2: His last A1c was 7%, 10/2021.  He is taking Metformin as prescribed.  His sugars range.  He checks his feet routinely.  His last eye exam was 03/2021.  Flu 03/2021.  Pneumovax 10/2021.  Prevnar 04/2020.  [place].  CKD 3: His last creatinine was 1.52, GFR 50, 10/2021.  He is on Lisinopril for renal protection.  He does not follow with nephrology.  COPD: He denies chronic cough or shortness of breath.  He uses Albuterol only as needed.  There are no PFTs on file.  GERD: He is not what triggers this. He denies breakthrough on Omeprazole.  Upper GI from 09/2019 reviewed.  OSA: He averages 8 hours of sleep per night without the use of his CPAP.  Sleep study from 11/2019 reviewed.  BPH: He denies symptoms. He is taking Flomax as prescribed.  He does not follow with urology.  Anxiety: Chronic, managed on Buspirone.  He is not currently seeing a therapist.  He denies depression, SI/HI.  Insomnia: He has difficulty falling asleep.  He is not taking any medication for this at this time.  Sleep study from 11/2019 reviewed.  Anemia: His last H/H was 12.1/36, 10/2021.  He is not taking any oral iron at this time.  He does not follow with hematology.  Review of Systems     Past Medical History:  Diagnosis Date   Anxiety    COPD (chronic obstructive pulmonary disease) (HCC)    Diabetes mellitus without complication (HCC)    GERD (gastroesophageal reflux disease)    Hypertension    Insomnia    Personal  history of tobacco use, presenting hazards to health 05/31/2015    Current Outpatient Medications  Medication Sig Dispense Refill   ACCU-CHEK GUIDE test strip TEST SUGAR UP TO TWICE DAILY 200 each 3   Accu-Chek Softclix Lancets lancets TEST BLOOD SUGAR UP TO TWICE DAILY 200 each 3   albuterol (PROVENTIL) (2.5 MG/3ML) 0.083% nebulizer solution Take 3 mLs (2.5 mg total) by nebulization every 6 (six) hours as needed for wheezing or shortness of breath. 75 mL 1   aspirin EC 81 MG tablet Take 1 tablet (81 mg total) by mouth daily. Swallow whole. (Patient not taking: Reported on 12/31/2021) 30 tablet 12   atorvastatin (LIPITOR) 80 MG tablet Take 1 tablet (80 mg total) by mouth daily. 90 tablet 1   Blood Glucose Monitoring Suppl (ACCU-CHEK GUIDE ME) w/Device KIT Use to check blood sugar up to 2 times daily 1 kit 0   busPIRone (BUSPAR) 5 MG tablet TAKE 1 TABLET BY MOUTH TWICE DAILY 180 tablet 1   ezetimibe (ZETIA) 10 MG tablet TAKE 1 TABLET BY MOUTH ONCE DAILY 90 tablet 2   hydrochlorothiazide (MICROZIDE) 12.5 MG capsule TAKE 1 CAPSULE BY MOUTH ONCE DAILY HIGH BLOOD PRESSURE 90 capsule 0   lisinopril (ZESTRIL) 20 MG tablet TAKE 1 TABLET BY MOUTH ONCE DAILY HIGH BLOOD PRESSURE 90 tablet 0   metFORMIN (  GLUCOPHAGE) 1000 MG tablet TAKE 1 TABLET BY MOUTH TWICE DAILY WITH FOOD FOR DIABETES 180 tablet 0   omeprazole (PRILOSEC) 20 MG capsule TAKE 1 CAPSULE BY MOUTH ONCE DAILY 90 capsule 0   tamsulosin (FLOMAX) 0.4 MG CAPS capsule Take 1 capsule (0.4 mg total) by mouth daily. 90 capsule 1   ULTRACARE PEN NEEDLES 32G X 5 MM MISC USE AS DIRECTED. WITH INJECT 0.375MLS (0.5MG TOTAL) SUBCUTANEOUSLY ONCE A WEEK 90 each 0   No current facility-administered medications for this visit.    Allergies  Allergen Reactions   Codeine     Other reaction(s): Vomiting    Family History  Problem Relation Age of Onset   Renal Disease Mother 36   Heart disease Father    Stroke Father    Alzheimer's disease Father    Stroke  Brother    Diabetes Brother    Diabetes Maternal Grandfather     Social History   Socioeconomic History   Marital status: Divorced    Spouse name: Not on file   Number of children: 3   Years of education: Not on file   Highest education level: 10th grade  Occupational History   Occupation: retired  Tobacco Use   Smoking status: Former    Packs/day: 1.00    Years: 40.00    Total pack years: 40.00    Types: Cigarettes    Quit date: 07/25/2018    Years since quitting: 3.7   Smokeless tobacco: Current  Vaping Use   Vaping Use: Never used  Substance and Sexual Activity   Alcohol use: Not Currently    Comment: quit 2003, drank about 5 beer per week prior   Drug use: Never   Sexual activity: Yes    Birth control/protection: None  Other Topics Concern   Not on file  Social History Narrative   Not on file   Social Determinants of Health   Financial Resource Strain: Low Risk  (10/11/2021)   Overall Financial Resource Strain (CARDIA)    Difficulty of Paying Living Expenses: Not hard at all  Food Insecurity: No Food Insecurity (10/11/2021)   Hunger Vital Sign    Worried About Running Out of Food in the Last Year: Never true    Momence in the Last Year: Never true  Transportation Needs: No Transportation Needs (10/11/2021)   PRAPARE - Hydrologist (Medical): No    Lack of Transportation (Non-Medical): No  Physical Activity: Inactive (10/11/2021)   Exercise Vital Sign    Days of Exercise per Week: 0 days    Minutes of Exercise per Session: 0 min  Stress: No Stress Concern Present (10/11/2021)   [place]    Feeling of Stress : Not at all  Social Connections: Socially Isolated (10/11/2021)   Social Connection and Isolation Panel [NHANES]    Frequency of Communication with Friends and Family: More than three times a week    Frequency of Social Gatherings with Friends and Family:  Three times a week    Attends Religious Services: Never    Active Member of Clubs or Organizations: No    Attends Archivist Meetings: Never    Marital Status: Divorced  Human resources officer Violence: Not At Risk (10/11/2021)   Humiliation, Afraid, Rape, and Kick questionnaire    Fear of Current or Ex-Partner: No    Emotionally Abused: No    Physically Abused: No  Sexually Abused: No     Constitutional: Denies fever, malaise, fatigue, headache or abrupt weight changes.  HEENT: Denies eye pain, eye redness, ear pain, ringing in the ears, wax buildup, runny nose, nasal congestion, bloody nose, or sore throat. Respiratory: Denies difficulty breathing, shortness of breath, cough or sputum production.   Cardiovascular: Denies chest pain, chest tightness, palpitations or swelling in the hands or feet.  Gastrointestinal: Denies abdominal pain, bloating, constipation, diarrhea or blood in the stool.  GU: Denies urgency, frequency, pain with urination, burning sensation, blood in urine, odor or discharge. Musculoskeletal: Patient reports frequent falls, tailbone pain.  Denies decrease in range of motion, difficulty with gait, muscle pain or joint swelling.  Skin: Denies redness, rashes, lesions or ulcercations.  Neurological: Patient reports insomnia, intermittent dizziness.  Denies dizziness, difficulty with memory, difficulty with speech or problems with balance and coordination.  Psych: Patient has a history of anxiety.  Denies depression, SI/HI.  No other specific complaints in a complete review of systems (except as listed in HPI above).  Objective:   Physical Exam  BP 118/74 (BP Location: Left Arm, Patient Position: Sitting, Cuff Size: Normal)   Pulse 92   Temp (!) 97.1 F (36.2 C) (Temporal)   Wt 155 lb (70.3 kg)   SpO2 98%   BMI 25.02 kg/m   Wt Readings from Last 3 Encounters:  12/31/21 152 lb (68.9 kg)  10/28/21 151 lb (68.5 kg)  07/03/21 141 lb (64 kg)    General:  Appears his stated age, obese, in NAD. Skin: Warm, dry and intact. No ulcerations noted. HEENT: Head: normal shape and size; Eyes: sclera white, no icterus, conjunctiva pink, PERRLA and EOMs intact;  Neck:  Neck supple, trachea midline. No masses, lumps or thyromegaly present.  Cardiovascular: Normal rate and rhythm. S1,S2 noted.  No murmur, rubs or gallops noted. No JVD or BLE edema. No carotid bruits noted. Pulmonary/Chest: Normal effort and positive vesicular breath sounds. No respiratory distress. No wheezes, rales or ronchi noted.  Abdomen: . Normal bowel sounds.  Musculoskeletal: Pain with palpation over the sacrum.. No difficulty with gait.  Neurological: Alert and oriented. Cranial nerves II-XII grossly intact. Coordination normal.  Psychiatric: Mood and affect normal. Behavior is normal. Judgment and thought content normal.    BMET    Component Value Date/Time   NA 140 10/28/2021 1348   NA 137 02/05/2013 2355   K 5.0 10/28/2021 1348   K 3.8 02/05/2013 2355   CL 107 10/28/2021 1348   CL 107 02/05/2013 2355   CO2 23 10/28/2021 1348   CO2 23 02/05/2013 2355   GLUCOSE 137 10/28/2021 1348   GLUCOSE 113 (H) 02/05/2013 2355   BUN 23 10/28/2021 1348   BUN 9 02/05/2013 2355   CREATININE 1.52 (H) 10/28/2021 1348   CALCIUM 9.4 10/28/2021 1348   CALCIUM 8.9 02/05/2013 2355   GFRNONAA >60 07/02/2021 1241   GFRNONAA 61 02/16/2020 0922   GFRAA 70 02/16/2020 0922    Lipid Panel     Component Value Date/Time   CHOL 106 10/28/2021 1348   TRIG 263 (H) 10/28/2021 1348   HDL 36 (L) 10/28/2021 1348   CHOLHDL 2.9 10/28/2021 1348   LDLCALC 39 10/28/2021 1348    CBC    Component Value Date/Time   WBC 7.5 10/28/2021 1348   RBC 4.10 (L) 10/28/2021 1348   HGB 12.1 (L) 10/28/2021 1348   HGB 13.0 02/05/2013 2355   HCT 36.0 (L) 10/28/2021 1348   HCT 36.7 (L) 02/05/2013  2355   PLT 208 10/28/2021 1348   PLT 106 (L) 02/05/2013 2355   MCV 87.8 10/28/2021 1348   MCV 93 02/05/2013 2355    MCH 29.5 10/28/2021 1348   MCHC 33.6 10/28/2021 1348   RDW 14.5 10/28/2021 1348   RDW 13.5 02/05/2013 2355   LYMPHSABS 1,864 02/16/2020 0922   LYMPHSABS 1.8 02/01/2013 1304   MONOABS 0.4 02/01/2013 1304   EOSABS 207 02/16/2020 0922   EOSABS 0.2 02/01/2013 1304   BASOSABS 30 02/16/2020 0922   BASOSABS 0.0 02/01/2013 1304    Hgb A1C Lab Results  Component Value Date   HGBA1C 7.0 (H) 10/28/2021           Assessment & Plan:    Dizziness, Frequent Falls, Tailbone Pain:  Orthostatics positive Encourage adequate water intake Make position changes slowly X-ray sacrum/coccyx to rule out fracture Rx for meclizine 25 mg every 8 hours as needed for dizziness   RTC in 6 months for annual exam Webb Silversmith, NP

## 2022-04-29 NOTE — Assessment & Plan Note (Signed)
Avoid foods that trigger your reflux Continue Omeprazole

## 2022-04-29 NOTE — Assessment & Plan Note (Signed)
-   Continue Flomax 

## 2022-04-29 NOTE — Assessment & Plan Note (Signed)
C-Met and lipid profile today Encouraged him to consume a low-fat diet Continue atorvastatin 

## 2022-04-29 NOTE — Patient Instructions (Signed)

## 2022-04-29 NOTE — Assessment & Plan Note (Signed)
Not medicated 

## 2022-04-29 NOTE — Assessment & Plan Note (Signed)
C-Met and lipid profile today Encouraged him to consume a low-fat diet Continue aspirin, atorvastatin and ezetimibe

## 2022-04-29 NOTE — Assessment & Plan Note (Signed)
CMET today 

## 2022-04-29 NOTE — Assessment & Plan Note (Signed)
CBC today.  

## 2022-04-29 NOTE — Assessment & Plan Note (Signed)
Continue Albuterol as needed 

## 2022-04-30 LAB — CBC
HCT: 32.5 % — ABNORMAL LOW (ref 38.5–50.0)
Hemoglobin: 10.9 g/dL — ABNORMAL LOW (ref 13.2–17.1)
MCH: 28.3 pg (ref 27.0–33.0)
MCHC: 33.5 g/dL (ref 32.0–36.0)
MCV: 84.4 fL (ref 80.0–100.0)
MPV: 10.2 fL (ref 7.5–12.5)
Platelets: 183 10*3/uL (ref 140–400)
RBC: 3.85 10*6/uL — ABNORMAL LOW (ref 4.20–5.80)
RDW: 14.9 % (ref 11.0–15.0)
WBC: 5.8 10*3/uL (ref 3.8–10.8)

## 2022-04-30 LAB — LIPID PANEL
Cholesterol: 74 mg/dL (ref <200)
HDL: 27 mg/dL — ABNORMAL LOW (ref >=40)
LDL Cholesterol (Calc): 19 mg/dL (calc)
Non-HDL Cholesterol (Calc): 47 mg/dL (calc) (ref <130)
Total CHOL/HDL Ratio: 2.7 (calc) (ref <5.0)
Triglycerides: 215 mg/dL — ABNORMAL HIGH (ref <150)

## 2022-04-30 LAB — COMPLETE METABOLIC PANEL WITH GFR
AG Ratio: 1.4 (calc) (ref 1.0–2.5)
ALT: 17 U/L (ref 9–46)
AST: 18 U/L (ref 10–35)
Albumin: 4.3 g/dL (ref 3.6–5.1)
Alkaline phosphatase (APISO): 66 U/L (ref 35–144)
BUN/Creatinine Ratio: 14 (calc) (ref 6–22)
BUN: 20 mg/dL (ref 7–25)
CO2: 24 mmol/L (ref 20–32)
Calcium: 8.9 mg/dL (ref 8.6–10.3)
Chloride: 105 mmol/L (ref 98–110)
Creat: 1.38 mg/dL — ABNORMAL HIGH (ref 0.70–1.35)
Globulin: 3 g/dL (calc) (ref 1.9–3.7)
Glucose, Bld: 199 mg/dL — ABNORMAL HIGH (ref 65–99)
Potassium: 4.8 mmol/L (ref 3.5–5.3)
Sodium: 138 mmol/L (ref 135–146)
Total Bilirubin: 0.7 mg/dL (ref 0.2–1.2)
Total Protein: 7.3 g/dL (ref 6.1–8.1)
eGFR: 56 mL/min/{1.73_m2} — ABNORMAL LOW (ref >=60)

## 2022-05-06 ENCOUNTER — Other Ambulatory Visit: Payer: Self-pay | Admitting: Internal Medicine

## 2022-05-06 NOTE — Telephone Encounter (Signed)
Requested medication (s) are due for refill today - no  Requested medication (s) are on the active medication list -no  Future visit scheduled -yes  Last refill: 04/29/22  Notes to clinic: non delegated Rx, no longer listed on current medication list   Requested Prescriptions  Pending Prescriptions Disp Refills   traMADol (ULTRAM) 50 MG tablet [Pharmacy Med Name: TRAMADOL HCL 50 MG TAB] 15 tablet     Sig: TAKE 1 TABLET BY MOUTH EVERY 8 HOURS AS NEEDED FOR PAIN     Not Delegated - Analgesics:  Opioid Agonists Failed - 05/06/2022  2:35 PM      Failed - This refill cannot be delegated      Failed - Urine Drug Screen completed in last 360 days      Passed - Valid encounter within last 3 months    Recent Outpatient Visits           1 week ago Aortic atherosclerosis ([place])   [facility] [place], Coralie Keens, NP   4 months ago Benign prostatic hyperplasia with urinary hesitancy   [facility] [place], Coralie Keens, NP   6 months ago Encounter for general adult medical examination with abnormal findings   [facility] [place], Coralie Keens, NP   10 months ago Other chest pain   [facility] [place], [place], NP   1 year ago Type 2 diabetes mellitus with other specified complication, without long-term current use of insulin ([place])   [facility] [place], Coralie Keens, NP       Future Appointments             In 5 months [place], Coralie Keens, NP [facility]               Requested Prescriptions  Pending Prescriptions Disp Refills   traMADol (ULTRAM) 50 MG tablet [Pharmacy Med Name: TRAMADOL HCL 50 MG TAB] 15 tablet     Sig: TAKE 1 TABLET BY MOUTH EVERY 8 HOURS AS NEEDED FOR PAIN     Not Delegated - Analgesics:  Opioid Agonists Failed - 05/06/2022  2:35 PM      Failed - This refill cannot be delegated      Failed - Urine Drug Screen completed in last 360 days      Passed - Valid encounter within last 3  months    Recent Outpatient Visits           1 week ago Aortic atherosclerosis ([place])   [facility] [place], Coralie Keens, NP   4 months ago Benign prostatic hyperplasia with urinary hesitancy   [facility] [place], Coralie Keens, NP   6 months ago Encounter for general adult medical examination with abnormal findings   [facility] [place], Coralie Keens, NP   10 months ago Other chest pain   [facility] [place], [place], NP   1 year ago Type 2 diabetes mellitus with other specified complication, without long-term current use of insulin [facility])   [facility] [place], Coralie Keens, NP       Future Appointments             In 5 months [place], Coralie Keens, NP [facility]

## 2022-05-12 ENCOUNTER — Other Ambulatory Visit: Payer: Self-pay | Admitting: Internal Medicine

## 2022-05-13 NOTE — Telephone Encounter (Signed)
Requested medication (s) are due for refill today -provider review   Requested medication (s) are on the active medication list -yes  Future visit scheduled -yes  Last refill: 04/29/22 #30  Notes to clinic: non delegated Rx  Requested Prescriptions  Pending Prescriptions Disp Refills   meclizine (ANTIVERT) 25 MG tablet [Pharmacy Med Name: MECLIZINE HCL 25 MG TAB] 30 tablet 0    Sig: TAKE 1 TABLET BY MOUTH 3 TIMES DAILY AS NEEDED FOR DIZZINESS     Not Delegated - Gastroenterology: Antiemetics Failed - 05/12/2022 12:14 PM      Failed - This refill cannot be delegated      Passed - Valid encounter within last 6 months    Recent Outpatient Visits           2 weeks ago Aortic atherosclerosis ([place])   [facility] [place], Coralie Keens, NP   4 months ago Benign prostatic hyperplasia with urinary hesitancy   [facility] [place], Coralie Keens, NP   6 months ago Encounter for general adult medical examination with abnormal findings   [facility] [place], Coralie Keens, NP   10 months ago Other chest pain   [facility] [place], [place] W, NP   1 year ago Type 2 diabetes mellitus with other specified complication, without long-term current use of insulin ([place])   [facility], Coralie Keens, NP       Future Appointments             In 5 months Baity, Coralie Keens, NP [facility], [facility]               Requested Prescriptions  Pending Prescriptions Disp Refills   meclizine (ANTIVERT) 25 MG tablet [Pharmacy Med Name: MECLIZINE HCL 25 MG TAB] 30 tablet 0    Sig: TAKE 1 TABLET BY MOUTH 3 TIMES DAILY AS NEEDED FOR DIZZINESS     Not Delegated - Gastroenterology: Antiemetics Failed - 05/12/2022 12:14 PM      Failed - This refill cannot be delegated      Passed - Valid encounter within last 6 months    Recent Outpatient Visits           2 weeks ago Aortic atherosclerosis [facility])   [facility]  [place], Coralie Keens, NP   4 months ago Benign prostatic hyperplasia with urinary hesitancy   [facility] [place], Coralie Keens, NP   6 months ago Encounter for general adult medical examination with abnormal findings   [facility] [place], Coralie Keens, NP   10 months ago Other chest pain   [facility] [place], [place] W, NP   1 year ago Type 2 diabetes mellitus with other specified complication, without long-term current use of insulin [facility])   [facility] [place], Coralie Keens, NP       Future Appointments             In 5 months Baity, Coralie Keens, NP [facility], [facility]

## 2022-05-14 DIAGNOSIS — E113293 Type 2 diabetes mellitus with mild nonproliferative diabetic retinopathy without macular edema, bilateral: Secondary | ICD-10-CM | POA: Diagnosis not present

## 2022-05-14 DIAGNOSIS — H43393 Other vitreous opacities, bilateral: Secondary | ICD-10-CM | POA: Diagnosis not present

## 2022-05-14 DIAGNOSIS — H2513 Age-related nuclear cataract, bilateral: Secondary | ICD-10-CM | POA: Diagnosis not present

## 2022-05-14 DIAGNOSIS — I10 Essential (primary) hypertension: Secondary | ICD-10-CM | POA: Diagnosis not present

## 2022-05-14 LAB — HM DIABETES EYE EXAM

## 2022-05-23 ENCOUNTER — Other Ambulatory Visit: Payer: Self-pay | Admitting: Internal Medicine

## 2022-05-24 NOTE — Telephone Encounter (Signed)
Requested Prescriptions  Pending Prescriptions Disp Refills   busPIRone (BUSPAR) 5 MG tablet [Pharmacy Med Name: BUSPIRONE HCL 5 MG TAB] 180 tablet 0    Sig: TAKE 1 TABLET BY MOUTH TWICE DAILY     Psychiatry: Anxiolytics/Hypnotics - Non-controlled Passed - 05/23/2022 10:45 AM      Passed - Valid encounter within last 12 months    Recent Outpatient Visits           3 weeks ago Aortic atherosclerosis ([place])   [facility] [place], Coralie Keens, NP   4 months ago Benign prostatic hyperplasia with urinary hesitancy   [facility] [place], Coralie Keens, NP   6 months ago Encounter for general adult medical examination with abnormal findings   [facility] [place], Coralie Keens, NP   10 months ago Other chest pain   [facility] [place], [place] W, NP   1 year ago Type 2 diabetes mellitus with other specified complication, without long-term current use of insulin ([place])   [facility], Coralie Keens, NP       Future Appointments             In 5 months [place], Coralie Keens, NP [facility], [facility]             atorvastatin (LIPITOR) 80 MG tablet [Pharmacy Med Name: ATORVASTATIN CALCIUM 80 MG TAB] 90 tablet 3    Sig: TAKE 1 TABLET BY MOUTH ONCE EVERY EVENING     Cardiovascular:  Antilipid - Statins Failed - 05/23/2022 10:45 AM      Failed - Lipid Panel in normal range within the last 12 months    Cholesterol  Date Value Ref Range Status  04/29/2022 74 <200 mg/dL Final   LDL Cholesterol (Calc)  Date Value Ref Range Status  04/29/2022 19 mg/dL (calc) Final    Comment:    Reference range: <100 . Desirable range <100 mg/dL for primary prevention;   <70 mg/dL for patients with CHD or diabetic patients  with > or = 2 CHD risk factors. [name] Kitchen LDL-C is now calculated using the Martin-Hopkins  calculation, which is a validated novel method providing  better accuracy than the Friedewald equation in the  estimation of LDL-C.   Cresenciano Genre et al. Annamaria Helling. 6063;016(01): 2061-2068  (http://education.Quest[employer].com/faq/FAQ164)    HDL  Date Value Ref Range Status  04/29/2022 27 (L) > OR = 40 mg/dL Final   Triglycerides  Date Value Ref Range Status  04/29/2022 215 (H) <150 mg/dL Final    Comment:    . If a non-fasting specimen was collected, consider repeat triglyceride testing on a fasting specimen if clinically indicated.  Yates Decamp et al. J. of Clin. Lipidol. 0932;3:557-322. [name] Kitchen          Passed - Patient is not pregnant      Passed - Valid encounter within last 12 months    Recent Outpatient Visits           3 weeks ago Aortic atherosclerosis [facility])   [facility] [place], Coralie Keens, NP   4 months ago Benign prostatic hyperplasia with urinary hesitancy   [place], NP   6 months ago Encounter for general adult medical examination with abnormal findings   [place], NP   10 months ago Other chest pain   [facility] [place], Coralie Keens, NP   1 year  ago Type 2 diabetes mellitus with other specified complication, without long-term current use of insulin ([place])   [facility] [place], Coralie Keens, NP       Future Appointments             In 5 months Baity, Coralie Keens, NP [facility], [facility]

## 2022-06-04 ENCOUNTER — Encounter: Payer: Self-pay | Admitting: Internal Medicine

## 2022-07-04 ENCOUNTER — Other Ambulatory Visit: Payer: Self-pay | Admitting: Internal Medicine

## 2022-07-04 NOTE — Telephone Encounter (Signed)
Requested Prescriptions  Pending Prescriptions Disp Refills   tamsulosin (FLOMAX) 0.4 MG CAPS capsule [Pharmacy Med Name: TAMSULOSIN HCL 0.4 MG CAP] 90 capsule 1    Sig: TAKE 1 CAPSULE BY MOUTH ONCE DAILY 30 MINUTES AFTER LARGEST MEAL.     Urology: Alpha-Adrenergic Blocker Passed - 07/04/2022  8:36 AM      Passed - PSA in normal range and within 360 days    PSA  Date Value Ref Range Status  10/28/2021 0.79 < OR = 4.00 ng/mL Final    Comment:    The total PSA value from this assay system is  standardized against the WHO standard. The test  result will be approximately 20% lower when compared  to the equimolar-standardized total PSA (Beckman  Coulter). Comparison of serial PSA results should be  interpreted with this fact in mind. . This test was performed using the Siemens  chemiluminescent method. Values obtained from  different assay methods cannot be used interchangeably. PSA levels, regardless of value, should not be interpreted as absolute evidence of the presence or absence of disease.          Passed - Last BP in normal range    BP Readings from Last 1 Encounters:  04/29/22 118/74         Passed - Valid encounter within last 12 months    Recent Outpatient Visits           2 months ago Aortic atherosclerosis [facility])   [facility] [place], [place] W, NP   6 months ago Benign prostatic hyperplasia with urinary hesitancy   [facility] [place], Coralie Keens, NP   8 months ago Encounter for general adult medical examination with abnormal findings   [facility] [place], Coralie Keens, NP   1 year ago Other chest pain   [facility] [place], [place], NP   1 year ago Type 2 diabetes mellitus with other specified complication, without long-term current use of insulin [facility])   [facility] [place], Coralie Keens, NP       Future Appointments             In 3  months Baity, Coralie Keens, NP [facility], [facility]

## 2022-08-20 ENCOUNTER — Ambulatory Visit: Payer: Self-pay | Admitting: *Deleted

## 2022-08-20 NOTE — Telephone Encounter (Signed)
Summary: rx concern   The patient has called regarding their busPIRone (BUSPAR) 5 MG tablet [id number]  The patient shares that the medication has been ineffective and they are continuing to experience behavioral health concerns  Please contact the patient further when possible     Reason for Disposition  Prescription request for new medicine (not a refill)  Answer Assessment - Initial Assessment Questions 1. NAME of MEDICINE: "What medicine(s) are you calling about?"     Buspirone- not effective 2. QUESTION: "What is your question?" (e.g., double dose of medicine, side effect)     Anxiety and depression is not controlled  3. PRESCRIBER: "Who prescribed the medicine?" Reason: if prescribed by specialist, call should be referred to that group.     PCP 4. SYMPTOMS: "Do you have any symptoms?" If Yes, ask: "What symptoms are you having?"  "How bad are the symptoms (e.g., mild, moderate, severe)     Anxiety and depression- not helping, gets upset easily  Protocols used: Medication Question Call-A-AH

## 2022-08-20 NOTE — Telephone Encounter (Signed)
  Chief Complaint: patient requests change in medication ( Buspar)- he states it is not helping his anxiety/ depression Symptoms: anxiety/depression not well controled- patient states he gets upset easily Frequency: chronic- has been using Buspar for 1 year- patient states it is not helping Pertinent Negatives: Patient denies suicidal thoughts Disposition: [] ED /[] Urgent Care (no appt availability in office) / [x] Appointment(In office/virtual)/ []  [facility]/ [] Home Care/ [] Refused Recommended Disposition /[]  Mobile Bus/ []  Follow-up with PCP Additional Notes: Patient is requesting appointment to discuss his anxiety medication- he feels it is not effective. Patient has been scheduled for appointment- advised ED for worsening symptoms.

## 2022-08-21 ENCOUNTER — Encounter: Payer: Self-pay | Admitting: Internal Medicine

## 2022-08-21 ENCOUNTER — Ambulatory Visit (INDEPENDENT_AMBULATORY_CARE_PROVIDER_SITE_OTHER): Payer: 59 | Admitting: Internal Medicine

## 2022-08-21 VITALS — BP 132/78 | HR 73 | Wt 153.8 lb

## 2022-08-21 DIAGNOSIS — F419 Anxiety disorder, unspecified: Secondary | ICD-10-CM | POA: Diagnosis not present

## 2022-08-21 MED ORDER — BUSPIRONE HCL 15 MG PO TABS: 15.0000 mg | ORAL_TABLET | Freq: Two times a day (BID) | ORAL | 0 refills | Status: DC

## 2022-08-21 MED ORDER — SERTRALINE HCL 50 MG PO TABS: 50.0000 mg | ORAL_TABLET | Freq: Every day | ORAL | 1 refills | Status: DC

## 2022-08-21 NOTE — Patient Instructions (Signed)

## 2022-08-21 NOTE — Assessment & Plan Note (Signed)
Deteriorated Start sertraline 50 mg daily Increase buspirone to 15 mg BID He declines referral to a therapist or psychiatrist at this time

## 2022-08-21 NOTE — Progress Notes (Signed)
Subjective:    Patient ID: [name], male    DOB: Sep 28, 1955, 67 y.o.   MRN: [id number]  HPI  Patient presents to clinic today for follow-up of anxiety. He reports this has been worse lately. He has been under a lot of stress lately. His son is having a lot of chronic medical problems. He recently had a death in the family. This is currently managed on Buspirone. He reports he was on Xanax in the past. He is not currently seeing a therapist but has seen a psychiatrist in the past.  He denies depression, SI/HI.  Review of Systems     Past Medical History:  Diagnosis Date   Anxiety    COPD (chronic obstructive pulmonary disease) (HCC)    Diabetes mellitus without complication (HCC)    GERD (gastroesophageal reflux disease)    Hypertension    Insomnia    Personal history of tobacco use, presenting hazards to health 05/31/2015    Current Outpatient Medications  Medication Sig Dispense Refill   ACCU-CHEK GUIDE test strip TEST SUGAR UP TO TWICE DAILY 200 each 3   Accu-Chek Softclix Lancets lancets TEST BLOOD SUGAR UP TO TWICE DAILY 200 each 3   albuterol (PROVENTIL) (2.5 MG/3ML) 0.083% nebulizer solution Take 3 mLs (2.5 mg total) by nebulization every 6 (six) hours as needed for wheezing or shortness of breath. 75 mL 1   aspirin EC 81 MG tablet Take 1 tablet (81 mg total) by mouth daily. Swallow whole. 30 tablet 12   atorvastatin (LIPITOR) 80 MG tablet TAKE 1 TABLET BY MOUTH ONCE EVERY EVENING 90 tablet 3   Blood Glucose Monitoring Suppl (ACCU-CHEK GUIDE ME) w/Device KIT Use to check blood sugar up to 2 times daily 1 kit 0   busPIRone (BUSPAR) 5 MG tablet TAKE 1 TABLET BY MOUTH TWICE DAILY 180 tablet 0   ezetimibe (ZETIA) 10 MG tablet TAKE 1 TABLET BY MOUTH ONCE DAILY 90 tablet 2   hydrochlorothiazide (MICROZIDE) 12.5 MG capsule TAKE 1 CAPSULE BY MOUTH ONCE DAILY HIGH BLOOD PRESSURE 90 capsule 0   lisinopril (ZESTRIL) 20 MG tablet TAKE 1 TABLET BY MOUTH ONCE DAILY HIGH BLOOD  PRESSURE 90 tablet 0   meclizine (ANTIVERT) 25 MG tablet TAKE 1 TABLET BY MOUTH 3 TIMES DAILY AS NEEDED FOR DIZZINESS 30 tablet 0   metFORMIN (GLUCOPHAGE) 1000 MG tablet TAKE 1 TABLET BY MOUTH TWICE DAILY WITH FOOD FOR DIABETES 180 tablet 0   omeprazole (PRILOSEC) 20 MG capsule TAKE 1 CAPSULE BY MOUTH ONCE DAILY 90 capsule 0   tamsulosin (FLOMAX) 0.4 MG CAPS capsule TAKE 1 CAPSULE BY MOUTH ONCE DAILY 30 MINUTES AFTER LARGEST MEAL. 90 capsule 1   ULTRACARE PEN NEEDLES 32G X 5 MM MISC USE AS DIRECTED. WITH INJECT 0.375MLS (0.5MG  TOTAL) SUBCUTANEOUSLY ONCE A WEEK 90 each 0   No current facility-administered medications for this visit.    Allergies  Allergen Reactions   Codeine     Other reaction(s): Vomiting    Family History  Problem Relation Age of Onset   Renal Disease Mother 31   Heart disease Father    Stroke Father    Alzheimer's disease Father    Stroke Brother    Diabetes Brother    Diabetes Maternal Grandfather     Social History   Socioeconomic History   Marital status: Divorced    Spouse name: Not on file   Number of children: 3   Years of education: Not on file   Highest  education level: 10th grade  Occupational History   Occupation: retired  Tobacco Use   Smoking status: Former    Packs/day: 1.00    Years: 40.00    Additional pack years: 0.00    Total pack years: 40.00    Types: Cigarettes    Quit date: 07/25/2018    Years since quitting: 4.0   Smokeless tobacco: Current  Vaping Use   Vaping Use: Never used  Substance and Sexual Activity   Alcohol use: Not Currently    Comment: quit 2003, drank about 5 beer per week prior   Drug use: Never   Sexual activity: Yes    Birth control/protection: None  Other Topics Concern   Not on file  Social History Narrative   Not on file   Social Determinants of Health   Financial Resource Strain: Low Risk  (10/11/2021)   Overall Financial Resource Strain (CARDIA)    Difficulty of Paying Living Expenses: Not hard  at all  Food Insecurity: No Food Insecurity (10/11/2021)   Hunger Vital Sign    Worried About Running Out of Food in the Last Year: Never true    Ran Out of Food in the Last Year: Never true  Transportation Needs: No Transportation Needs (10/11/2021)   PRAPARE - Hydrologist (Medical): No    Lack of Transportation (Non-Medical): No  Physical Activity: Inactive (10/11/2021)   Exercise Vital Sign    Days of Exercise per Week: 0 days    Minutes of Exercise per Session: 0 min  Stress: No Stress Concern Present (10/11/2021)   [place]    Feeling of Stress : Not at all  Social Connections: Socially Isolated (10/11/2021)   Social Connection and Isolation Panel [NHANES]    Frequency of Communication with Friends and Family: More than three times a week    Frequency of Social Gatherings with Friends and Family: Three times a week    Attends Religious Services: Never    Active Member of Clubs or Organizations: No    Attends Archivist Meetings: Never    Marital Status: Divorced  Human resources officer Violence: Not At Risk (10/11/2021)   Humiliation, Afraid, Rape, and Kick questionnaire    Fear of Current or Ex-Partner: No    Emotionally Abused: No    Physically Abused: No    Sexually Abused: No     Constitutional: Denies fever, malaise, fatigue, headache or abrupt weight changes.  Respiratory: Denies difficulty breathing, shortness of breath, cough or sputum production.   Cardiovascular: Denies chest pain, chest tightness, palpitations or swelling in the hands or feet.  Neurological: Pt has a history of insomnia. Denies dizziness, difficulty with memory, difficulty with speech or problems with balance and coordination.  Psych: Patient has a history of anxiety.  Denies depression, SI/HI.  No other specific complaints in a complete review of systems (except as listed in HPI above).  Objective:    Physical Exam  BP 132/78 (BP Location: Left Arm, Patient Position: Sitting)   Pulse 73   Wt 153 lb 12.8 oz (69.8 kg)   SpO2 97%   BMI 24.82 kg/m   Wt Readings from Last 3 Encounters:  04/29/22 155 lb (70.3 kg)  12/31/21 152 lb (68.9 kg)  10/28/21 151 lb (68.5 kg)    General: Appears his stated age, well developed, well nourished in NAD. Cardiovascular: Normal rate and rhythm. S1,S2 noted.  No murmur, rubs or gallops noted.  Pulmonary/Chest: Normal effort and positive vesicular breath sounds. No respiratory distress. No wheezes, rales or ronchi noted.  Musculoskeletal: No difficulty with gait.  Neurological: Alert and oriented. Coordination normal.  Psychiatric: Mood and affect normal. Mildly anxious appearing. Judgment and thought content normal.    BMET    Component Value Date/Time   NA 138 04/29/2022 1400   NA 137 02/05/2013 2355   K 4.8 04/29/2022 1400   K 3.8 02/05/2013 2355   CL 105 04/29/2022 1400   CL 107 02/05/2013 2355   CO2 24 04/29/2022 1400   CO2 23 02/05/2013 2355   GLUCOSE 199 (H) 04/29/2022 1400   GLUCOSE 113 (H) 02/05/2013 2355   BUN 20 04/29/2022 1400   BUN 9 02/05/2013 2355   CREATININE 1.38 (H) 04/29/2022 1400   CALCIUM 8.9 04/29/2022 1400   CALCIUM 8.9 02/05/2013 2355   GFRNONAA >60 07/02/2021 1241   GFRNONAA 61 02/16/2020 0922   GFRAA 70 02/16/2020 0922    Lipid Panel     Component Value Date/Time   CHOL 74 04/29/2022 1400   TRIG 215 (H) 04/29/2022 1400   HDL 27 (L) 04/29/2022 1400   CHOLHDL 2.7 04/29/2022 1400   LDLCALC 19 04/29/2022 1400    CBC    Component Value Date/Time   WBC 5.8 04/29/2022 1400   RBC 3.85 (L) 04/29/2022 1400   HGB 10.9 (L) 04/29/2022 1400   HGB 13.0 02/05/2013 2355   HCT 32.5 (L) 04/29/2022 1400   HCT 36.7 (L) 02/05/2013 2355   PLT 183 04/29/2022 1400   PLT 106 (L) 02/05/2013 2355   MCV 84.4 04/29/2022 1400   MCV 93 02/05/2013 2355   MCH 28.3 04/29/2022 1400   MCHC 33.5 04/29/2022 1400   RDW 14.9  04/29/2022 1400   RDW 13.5 02/05/2013 2355   LYMPHSABS 1,864 02/16/2020 0922   LYMPHSABS 1.8 02/01/2013 1304   MONOABS 0.4 02/01/2013 1304   EOSABS 207 02/16/2020 0922   EOSABS 0.2 02/01/2013 1304   BASOSABS 30 02/16/2020 0922   BASOSABS 0.0 02/01/2013 1304    Hgb A1C Lab Results  Component Value Date   HGBA1C 7.6 04/29/2022            Assessment & Plan:     RTC in 3 months for your annual exam Webb Silversmith, NP

## 2022-08-27 ENCOUNTER — Telehealth: Payer: Self-pay

## 2022-08-27 NOTE — Telephone Encounter (Signed)
Copied from [place] [id number]. Topic: General - Other >> Aug 27, 2022  8:06 AM Ludger Nutting wrote: Patient's states that he did not start taking the zoloft. Patient states that he was just dehydrated and is feeling better now.

## 2022-08-27 NOTE — Telephone Encounter (Signed)
Noted. Ok to discontinue off medication list if he is not planning on taking this.

## 2022-08-28 NOTE — Addendum Note (Signed)
Addended by: Ashley Royalty E on: 08/28/2022 11:21 AM   Modules accepted: Orders

## 2022-09-19 ENCOUNTER — Telehealth: Payer: Self-pay | Admitting: Internal Medicine

## 2022-09-19 NOTE — Telephone Encounter (Signed)
Contacted [name] to schedule their annual wellness visit. Appointment made for 10/16/2022.  [name]; Care Guide Ambulatory Clinical Support [place] [facility] Direct Dial: [phone]

## 2022-10-06 ENCOUNTER — Other Ambulatory Visit: Payer: Self-pay | Admitting: Internal Medicine

## 2022-10-07 NOTE — Telephone Encounter (Signed)
Last RF 07/04/22 #90 1 RF  Requested Prescriptions  Refused Prescriptions Disp Refills   tamsulosin (FLOMAX) 0.4 MG CAPS capsule [Pharmacy Med Name: TAMSULOSIN HCL 0.4 MG CAP] 90 capsule 1    Sig: TAKE 1 CAPSULE BY MOUTH ONCE DAILY 30 MINUTES AFTER LARGEST MEAL.     Urology: Alpha-Adrenergic Blocker Passed - 10/06/2022  1:55 PM      Passed - PSA in normal range and within 360 days    PSA  Date Value Ref Range Status  10/28/2021 0.79 < OR = 4.00 ng/mL Final    Comment:    The total PSA value from this assay system is  standardized against the WHO standard. The test  result will be approximately 20% lower when compared  to the equimolar-standardized total PSA (Beckman  Coulter). Comparison of serial PSA results should be  interpreted with this fact in mind. . This test was performed using the Siemens  chemiluminescent method. Values obtained from  different assay methods cannot be used interchangeably. PSA levels, regardless of value, should not be interpreted as absolute evidence of the presence or absence of disease.          Passed - Last BP in normal range    BP Readings from Last 1 Encounters:  08/21/22 132/78         Passed - Valid encounter within last 12 months    Recent Outpatient Visits           1 month ago Anxiety   [place] [facility] [place], Salvadore Oxford, NP   5 months ago Aortic atherosclerosis [facility])   [place] [facility] [place], Salvadore Oxford, NP   9 months ago Benign prostatic hyperplasia with urinary hesitancy   [place] [facility] [place], Salvadore Oxford, NP   11 months ago Encounter for general adult medical examination with abnormal findings   [employer] [facility] [place], Salvadore Oxford, NP   1 year ago Other chest pain   [place] [facility] [place], Salvadore Oxford, NP       Future Appointments             In 3 weeks [place], Salvadore Oxford, NP [place] [facility], [facility]

## 2022-10-16 ENCOUNTER — Ambulatory Visit (INDEPENDENT_AMBULATORY_CARE_PROVIDER_SITE_OTHER): Payer: 59

## 2022-10-16 ENCOUNTER — Other Ambulatory Visit: Payer: Self-pay | Admitting: Internal Medicine

## 2022-10-16 VITALS — BP 110/64 | Ht 66.0 in | Wt 154.6 lb

## 2022-10-16 DIAGNOSIS — Z0000 Encounter for general adult medical examination without abnormal findings: Secondary | ICD-10-CM

## 2022-10-16 DIAGNOSIS — E1169 Type 2 diabetes mellitus with other specified complication: Secondary | ICD-10-CM

## 2022-10-16 DIAGNOSIS — I10 Essential (primary) hypertension: Secondary | ICD-10-CM

## 2022-10-16 DIAGNOSIS — K219 Gastro-esophageal reflux disease without esophagitis: Secondary | ICD-10-CM

## 2022-10-16 NOTE — Patient Instructions (Signed)
[name] , Thank you for taking time to come for your Medicare Wellness Visit. I appreciate your ongoing commitment to your health goals. Please review the following plan we discussed and let me know if I can assist you in the future.   These are the goals we discussed:  Goals      DIET - INCREASE WATER INTAKE     Patient Stated     10/09/2020, no goals        This is a list of the screening recommended for you and due dates:  Health Maintenance  Topic Date Due   Zoster (Shingles) Vaccine (1 of 2) Never done   DTaP/Tdap/Td vaccine (2 - Td or Tdap) 10/07/2021   COVID-19 Vaccine (4 - 2023-24 season) 01/24/2022   Yearly kidney health urinalysis for diabetes  10/29/2022   Complete foot exam   10/29/2022   Hemoglobin A1C  10/29/2022   Screening for Lung Cancer  12/12/2022   Flu Shot  12/25/2022   Yearly kidney function blood test for diabetes  04/30/2023   Eye exam for diabetics  05/15/2023   Medicare Annual Wellness Visit  10/16/2023   Colon Cancer Screening  10/19/2029   Pneumonia Vaccine  Completed   Hepatitis C Screening  Completed   HPV Vaccine  Aged Out    Advanced directives: no  Conditions/risks identified: none  Next appointment: Follow up in one year for your annual wellness visit. 10/22/23 @ 1:30 pm in person  Preventive Care 65 Years and Older, Male  Preventive care refers to lifestyle choices and visits with your health care provider that can promote health and wellness. What does preventive care include? A yearly physical exam. This is also called an annual well check. Dental exams once or twice a year. Routine eye exams. Ask your health care provider how often you should have your eyes checked. Personal lifestyle choices, including: Daily care of your teeth and gums. Regular physical activity. Eating a healthy diet. Avoiding tobacco and drug use. Limiting alcohol use. Practicing safe sex. Taking low doses of aspirin every day. Taking vitamin and mineral  supplements as recommended by your health care provider. What happens during an annual well check? The services and screenings done by your health care provider during your annual well check will depend on your age, overall health, lifestyle risk factors, and family history of disease. Counseling  Your health care provider may ask you questions about your: Alcohol use. Tobacco use. Drug use. Emotional well-being. Home and relationship well-being. Sexual activity. Eating habits. History of falls. Memory and ability to understand (cognition). Work and work Astronomer. Screening  You may have the following tests or measurements: Height, weight, and BMI. Blood pressure. Lipid and cholesterol levels. These may be checked every 5 years, or more frequently if you are over 75 years old. Skin check. Lung cancer screening. You may have this screening every year starting at age 3 if you have a 30-pack-year history of smoking and currently smoke or have quit within the past 15 years. Fecal occult blood test (FOBT) of the stool. You may have this test every year starting at age 63. Flexible sigmoidoscopy or colonoscopy. You may have a sigmoidoscopy every 5 years or a colonoscopy every 10 years starting at age 49. Prostate cancer screening. Recommendations will vary depending on your family history and other risks. Hepatitis C blood test. Hepatitis B blood test. Sexually transmitted disease (STD) testing. Diabetes screening. This is done by checking your blood sugar (glucose) after  you have not eaten for a while (fasting). You may have this done every 1-3 years. Abdominal aortic aneurysm (AAA) screening. You may need this if you are a current or former smoker. Osteoporosis. You may be screened starting at age 3 if you are at high risk. Talk with your health care provider about your test results, treatment options, and if necessary, the need for more tests. Vaccines  Your health care provider  may recommend certain vaccines, such as: Influenza vaccine. This is recommended every year. Tetanus, diphtheria, and acellular pertussis (Tdap, Td) vaccine. You may need a Td booster every 10 years. Zoster vaccine. You may need this after age 76. Pneumococcal 13-valent conjugate (PCV13) vaccine. One dose is recommended after age 32. Pneumococcal polysaccharide (PPSV23) vaccine. One dose is recommended after age 18. Talk to your health care provider about which screenings and vaccines you need and how often you need them. This information is not intended to replace advice given to you by your health care provider. Make sure you discuss any questions you have with your health care provider. Document Released: 06/08/2015 Document Revised: 01/30/2016 Document Reviewed: 03/13/2015 Elsevier Interactive Patient Education  2017 [employer].  Fall Prevention in the Home Falls can cause injuries. They can happen to people of all ages. There are many things you can do to make your home safe and to help prevent falls. What can I do on the outside of my home? Regularly fix the edges of walkways and driveways and fix any cracks. Remove anything that might make you trip as you walk through a door, such as a raised step or threshold. Trim any bushes or trees on the path to your home. Use bright outdoor lighting. Clear any walking paths of anything that might make someone trip, such as rocks or tools. Regularly check to see if handrails are loose or broken. Make sure that both sides of any steps have handrails. Any raised decks and porches should have guardrails on the edges. Have any leaves, snow, or ice cleared regularly. Use sand or salt on walking paths during winter. Clean up any spills in your garage right away. This includes oil or grease spills. What can I do in the bathroom? Use night lights. Install grab bars by the toilet and in the tub and shower. Do not use towel bars as grab bars. Use  non-skid mats or decals in the tub or shower. If you need to sit down in the shower, use a plastic, non-slip stool. Keep the floor dry. Clean up any water that spills on the floor as soon as it happens. Remove soap buildup in the tub or shower regularly. Attach bath mats securely with double-sided non-slip rug tape. Do not have throw rugs and other things on the floor that can make you trip. What can I do in the bedroom? Use night lights. Make sure that you have a light by your bed that is easy to reach. Do not use any sheets or blankets that are too big for your bed. They should not hang down onto the floor. Have a firm chair that has side arms. You can use this for support while you get dressed. Do not have throw rugs and other things on the floor that can make you trip. What can I do in the kitchen? Clean up any spills right away. Avoid walking on wet floors. Keep items that you use a lot in easy-to-reach places. If you need to reach something above you, use a strong  step stool that has a grab bar. Keep electrical cords out of the way. Do not use floor polish or wax that makes floors slippery. If you must use wax, use non-skid floor wax. Do not have throw rugs and other things on the floor that can make you trip. What can I do with my stairs? Do not leave any items on the stairs. Make sure that there are handrails on both sides of the stairs and use them. Fix handrails that are broken or loose. Make sure that handrails are as long as the stairways. Check any carpeting to make sure that it is firmly attached to the stairs. Fix any carpet that is loose or worn. Avoid having throw rugs at the top or bottom of the stairs. If you do have throw rugs, attach them to the floor with carpet tape. Make sure that you have a light switch at the top of the stairs and the bottom of the stairs. If you do not have them, ask someone to add them for you. What else can I do to help prevent falls? Wear  shoes that: Do not have high heels. Have rubber bottoms. Are comfortable and fit you well. Are closed at the toe. Do not wear sandals. If you use a stepladder: Make sure that it is fully opened. Do not climb a closed stepladder. Make sure that both sides of the stepladder are locked into place. Ask someone to hold it for you, if possible. Clearly mark and make sure that you can see: Any grab bars or handrails. First and last steps. Where the edge of each step is. Use tools that help you move around (mobility aids) if they are needed. These include: Canes. Walkers. Scooters. Crutches. Turn on the lights when you go into a dark area. Replace any light bulbs as soon as they burn out. Set up your furniture so you have a clear path. Avoid moving your furniture around. If any of your floors are uneven, fix them. If there are any pets around you, be aware of where they are. Review your medicines with your doctor. Some medicines can make you feel dizzy. This can increase your chance of falling. Ask your doctor what other things that you can do to help prevent falls. This information is not intended to replace advice given to you by your health care provider. Make sure you discuss any questions you have with your health care provider. Document Released: 03/08/2009 Document Revised: 10/18/2015 Document Reviewed: 06/16/2014 Elsevier Interactive Patient Education  2017 [employer].

## 2022-10-16 NOTE — Telephone Encounter (Signed)
Requested Prescriptions  Pending Prescriptions Disp Refills   lisinopril (ZESTRIL) 20 MG tablet [Pharmacy Med Name: LISINOPRIL 20 MG TAB] 90 tablet 0    Sig: TAKE 1 TABLET BY MOUTH ONCE DAILY HIGH BLOOD PRESSURE     Cardiovascular:  ACE Inhibitors Failed - 10/16/2022  3:08 PM      Failed - Cr in normal range and within 180 days    Creat  Date Value Ref Range Status  04/29/2022 1.38 (H) 0.70 - 1.35 mg/dL Final   Creatinine, Urine  Date Value Ref Range Status  10/28/2021 376 (H) 20 - 320 mg/dL Final         Passed - K in normal range and within 180 days    Potassium  Date Value Ref Range Status  04/29/2022 4.8 3.5 - 5.3 mmol/L Final  02/05/2013 3.8 3.5 - 5.1 mmol/L Final         Passed - Patient is not pregnant      Passed - Last BP in normal range    BP Readings from Last 1 Encounters:  10/16/22 110/64         Passed - Valid encounter within last 6 months    Recent Outpatient Visits           1 month ago Anxiety   [place] [facility], Salvadore Oxford, NP   5 months ago Aortic atherosclerosis [facility])   [place] [facility] [place], Salvadore Oxford, NP   9 months ago Benign prostatic hyperplasia with urinary hesitancy   [place] [facility] [place], [place], NP   11 months ago Encounter for general adult medical examination with abnormal findings   [place] [facility] [place], Salvadore Oxford, NP   1 year ago Other chest pain   [place] [facility] [place], Salvadore Oxford, NP       Future Appointments             In 1 week Sampson Si, Salvadore Oxford, NP [place] [facility]             hydrochlorothiazide (MICROZIDE) 12.5 MG capsule [Pharmacy Med Name: HYDROCHLOROTHIAZIDE 12.5 MG CAP] 90 capsule 0    Sig: TAKE 1 CAPSULE BY MOUTH ONCE DAILY HIGH BLOOD PRESSURE     Cardiovascular: Diuretics - Thiazide Failed - 10/16/2022  3:08 PM      Failed - Cr in normal range and within 180 days     Creat  Date Value Ref Range Status  04/29/2022 1.38 (H) 0.70 - 1.35 mg/dL Final   Creatinine, Urine  Date Value Ref Range Status  10/28/2021 376 (H) 20 - 320 mg/dL Final         Passed - K in normal range and within 180 days    Potassium  Date Value Ref Range Status  04/29/2022 4.8 3.5 - 5.3 mmol/L Final  02/05/2013 3.8 3.5 - 5.1 mmol/L Final         Passed - Na in normal range and within 180 days    Sodium  Date Value Ref Range Status  04/29/2022 138 135 - 146 mmol/L Final  02/05/2013 137 136 - 145 mmol/L Final         Passed - Last BP in normal range    BP Readings from Last 1 Encounters:  10/16/22 110/64         Passed - Valid encounter within last 6 months  Recent Outpatient Visits           1 month ago Anxiety   [place] [facility] [place], [place], NP   5 months ago Aortic atherosclerosis [facility])   [place] [facility] [place], Salvadore Oxford, NP   9 months ago Benign prostatic hyperplasia with urinary hesitancy   [place] [facility] [place], Salvadore Oxford, NP   11 months ago Encounter for general adult medical examination with abnormal findings   [place] [facility] [place], Salvadore Oxford, NP   1 year ago Other chest pain   [place] [facility] [place], Salvadore Oxford, NP       Future Appointments             In 1 week Sampson Si, Salvadore Oxford, NP  [facility], [facility]             metFORMIN (GLUCOPHAGE) 1000 MG tablet [Pharmacy Med Name: METFORMIN HCL 1000 MG TAB] 180 tablet 0    Sig: TAKE 1 TABLET BY MOUTH TWICE DAILY WITH FOOD FOR DIABETES     Endocrinology:  Diabetes - Biguanides Failed - 10/16/2022  3:08 PM      Failed - Cr in normal range and within 360 days    Creat  Date Value Ref Range Status  04/29/2022 1.38 (H) 0.70 - 1.35 mg/dL Final   Creatinine, Urine  Date Value Ref Range Status  10/28/2021 376 (H) 20 - 320 mg/dL Final         Failed - eGFR in  normal range and within 360 days    GFR, Est African American  Date Value Ref Range Status  02/16/2020 70 > OR = 60 mL/min/1.33m2 Final   GFR, Est Non African American  Date Value Ref Range Status  02/16/2020 61 > OR = 60 mL/min/1.55m2 Final   GFR, Estimated  Date Value Ref Range Status  07/02/2021 >60 >60 mL/min Final    Comment:    (NOTE) Calculated using the CKD-EPI Creatinine Equation (2021)    eGFR  Date Value Ref Range Status  04/29/2022 56 (L) > OR = 60 mL/min/1.32m2 Final         Failed - B12 Level in normal range and within 720 days    No results found for: "VITAMINB12"       Failed - CBC within normal limits and completed in the last 12 months    WBC  Date Value Ref Range Status  04/29/2022 5.8 3.8 - 10.8 Thousand/uL Final   RBC  Date Value Ref Range Status  04/29/2022 3.85 (L) 4.20 - 5.80 Million/uL Final   Hemoglobin  Date Value Ref Range Status  04/29/2022 10.9 (L) 13.2 - 17.1 g/dL Final   HGB  Date Value Ref Range Status  02/05/2013 13.0 13.0 - 18.0 g/dL Final   HCT  Date Value Ref Range Status  04/29/2022 32.5 (L) 38.5 - 50.0 % Final  02/05/2013 36.7 (L) 40.0 - 52.0 % Final   MCHC  Date Value Ref Range Status  04/29/2022 33.5 32.0 - 36.0 g/dL Final   [facility]  Date Value Ref Range Status  04/29/2022 28.3 27.0 - 33.0 pg Final   MCV  Date Value Ref Range Status  04/29/2022 84.4 80.0 - 100.0 fL Final  02/05/2013 93 80 - 100 fL Final   No results found for: "PLTCOUNTKUC", "LABPLAT", "POCPLA" RDW  Date Value Ref Range Status  04/29/2022 14.9 11.0 -  15.0 % Final  02/05/2013 13.5 11.5 - 14.5 % Final         Passed - HBA1C is between 0 and 7.9 and within 180 days    HbA1c POC (<> result, manual entry)  Date Value Ref Range Status  04/29/2022 7.6 4.0 - 5.6 % Final         Passed - Valid encounter within last 6 months    Recent Outpatient Visits           1 month ago Anxiety   [place] [facility] [place], Salvadore Oxford, NP    5 months ago Aortic atherosclerosis [facility])   [place] [facility] [place], Salvadore Oxford, NP   9 months ago Benign prostatic hyperplasia with urinary hesitancy   [facility] [place], Salvadore Oxford, NP   11 months ago Encounter for general adult medical examination with abnormal findings   [place] [facility] [place], Salvadore Oxford, NP   1 year ago Other chest pain   [place] [facility] [place], Salvadore Oxford, NP       Future Appointments             In 1 week Sampson Si, Salvadore Oxford, NP [place] [facility], [facility]             ezetimibe (ZETIA) 10 MG tablet [Pharmacy Med Name: EZETIMIBE 10 MG TAB] 90 tablet 2    Sig: TAKE 1 TABLET BY MOUTH ONCE DAILY     Cardiovascular:  Antilipid - Sterol Transport Inhibitors Failed - 10/16/2022  3:08 PM      Failed - Lipid Panel in normal range within the last 12 months    Cholesterol  Date Value Ref Range Status  04/29/2022 74 <200 mg/dL Final   LDL Cholesterol (Calc)  Date Value Ref Range Status  04/29/2022 19 mg/dL (calc) Final    Comment:    Reference range: <100 . Desirable range <100 mg/dL for primary prevention;   <70 mg/dL for patients with CHD or diabetic patients  with > or = 2 CHD risk factors. [name] Kitchen LDL-C is now calculated using the Martin-Hopkins  calculation, which is a validated novel method providing  better accuracy than the Friedewald equation in the  estimation of LDL-C.  Horald Pollen et al. Lenox Ahr. 5784;696(29): 2061-2068  (http://education.[employer].com/faq/FAQ164)    HDL  Date Value Ref Range Status  04/29/2022 27 (L) > OR = 40 mg/dL Final   Triglycerides  Date Value Ref Range Status  04/29/2022 215 (H) <150 mg/dL Final    Comment:    . If a non-fasting specimen was collected, consider repeat triglyceride testing on a fasting specimen if clinically indicated.  Perry Mount et al. J. of Clin. Lipidol. 2015;9:129-169. [name] Kitchen          Passed -  AST in normal range and within 360 days    AST  Date Value Ref Range Status  04/29/2022 18 10 - 35 U/L Final   SGOT(AST)  Date Value Ref Range Status  02/05/2013 91 (H) 15 - 37 Unit/L Final         Passed - ALT in normal range and within 360 days    ALT  Date Value Ref Range Status  04/29/2022 17 9 - 46 U/L Final   SGPT (ALT)  Date Value Ref Range Status  02/05/2013 144 (H) 12 - 78 U/L Final         Passed -  Patient is not pregnant      Passed - Valid encounter within last 12 months    Recent Outpatient Visits           1 month ago Anxiety   [place] [facility] [place], Salvadore Oxford, NP   5 months ago Aortic atherosclerosis [facility])   [place] [facility] [place], Salvadore Oxford, NP   9 months ago Benign prostatic hyperplasia with urinary hesitancy   [place] [facility] [place], Salvadore Oxford, NP   11 months ago Encounter for general adult medical examination with abnormal findings   [place] [facility] [place], Salvadore Oxford, NP   1 year ago Other chest pain   [place] [facility] [place], Salvadore Oxford, NP       Future Appointments             In 1 week Sampson Si, Salvadore Oxford, NP [place] [facility] [place]

## 2022-10-16 NOTE — Progress Notes (Signed)
Subjective:   [name] is a 67 y.o. male who presents for Medicare Annual/Subsequent preventive examination.  Review of Systems     Cardiac Risk Factors include: advanced age (>28men, >82 women);diabetes mellitus;dyslipidemia;hypertension;male gender     Objective:    Today's Vitals   10/16/22 1328  BP: 110/64  Weight: 154 lb 9.6 oz (70.1 kg)  Height: 5\' 6"  (1.676 m)   Body mass index is 24.95 kg/m.     10/16/2022    1:40 PM 10/11/2021   10:02 AM 07/02/2021   12:40 PM 10/09/2020   10:41 AM 08/27/2020   10:28 AM 10/20/2019   10:33 AM 10/19/2019   12:24 PM  Advanced Directives  Does Patient Have a Medical Advance Directive? No No No No No No No  Would patient like information on creating a medical advance directive? No - Patient declined No - Patient declined No - Patient declined   No - Patient declined     Current Medications (verified) Outpatient Encounter Medications as of 10/16/2022  Medication Sig   ACCU-CHEK GUIDE test strip TEST SUGAR UP TO TWICE DAILY   Accu-Chek Softclix Lancets lancets TEST BLOOD SUGAR UP TO TWICE DAILY   albuterol (PROVENTIL) (2.5 MG/3ML) 0.083% nebulizer solution Take 3 mLs (2.5 mg total) by nebulization every 6 (six) hours as needed for wheezing or shortness of breath.   aspirin EC 81 MG tablet Take 1 tablet (81 mg total) by mouth daily. Swallow whole.   atorvastatin (LIPITOR) 80 MG tablet TAKE 1 TABLET BY MOUTH ONCE EVERY EVENING   Blood Glucose Monitoring Suppl (ACCU-CHEK GUIDE ME) w/Device KIT Use to check blood sugar up to 2 times daily   ezetimibe (ZETIA) 10 MG tablet TAKE 1 TABLET BY MOUTH ONCE DAILY   hydrochlorothiazide (MICROZIDE) 12.5 MG capsule TAKE 1 CAPSULE BY MOUTH ONCE DAILY HIGH BLOOD PRESSURE   lisinopril (ZESTRIL) 20 MG tablet TAKE 1 TABLET BY MOUTH ONCE DAILY HIGH BLOOD PRESSURE   metFORMIN (GLUCOPHAGE) 1000 MG tablet TAKE 1 TABLET BY MOUTH TWICE DAILY WITH FOOD FOR DIABETES   omeprazole (PRILOSEC) 20 MG capsule TAKE 1  CAPSULE BY MOUTH ONCE DAILY   tamsulosin (FLOMAX) 0.4 MG CAPS capsule TAKE 1 CAPSULE BY MOUTH ONCE DAILY 30 MINUTES AFTER LARGEST MEAL.   ULTRACARE PEN NEEDLES 32G X 5 MM MISC USE AS DIRECTED. WITH INJECT 0.375MLS (0.5MG  TOTAL) SUBCUTANEOUSLY ONCE A WEEK   busPIRone (BUSPAR) 15 MG tablet Take 1 tablet (15 mg total) by mouth 2 (two) times daily. (Patient not taking: Reported on 10/16/2022)   meclizine (ANTIVERT) 25 MG tablet TAKE 1 TABLET BY MOUTH 3 TIMES DAILY AS NEEDED FOR DIZZINESS (Patient not taking: Reported on 10/16/2022)   No facility-administered encounter medications on file as of 10/16/2022.    Allergies (verified) Codeine   History: Past Medical History:  Diagnosis Date   Anxiety    COPD (chronic obstructive pulmonary disease) (HCC)    Diabetes mellitus without complication (HCC)    GERD (gastroesophageal reflux disease)    Hypertension    Insomnia    Personal history of tobacco use, presenting hazards to health 05/31/2015   Past Surgical History:  Procedure Laterality Date   COLONOSCOPY WITH PROPOFOL N/A 10/20/2019   Procedure: COLONOSCOPY WITH PROPOFOL;  Surgeon: Wyline Mood, MD;  Location: [facility] ENDOSCOPY;  Service: Gastroenterology;  Laterality: N/A;   HERNIA REPAIR Left 2003   SKIN CANCER EXCISION  09/23/2020   had skin graft removed    Family History  Problem Relation Age  of Onset   Renal Disease Mother 10   Heart disease Father    Stroke Father    Alzheimer's disease Father    Stroke Brother    Diabetes Brother    Diabetes Maternal Grandfather    Social History   Socioeconomic History   Marital status: Divorced    Spouse name: Not on file   Number of children: 3   Years of education: Not on file   Highest education level: 10th grade  Occupational History   Occupation: retired  Tobacco Use   Smoking status: Former    Packs/day: 1.00    Years: 40.00    Additional pack years: 0.00    Total pack years: 40.00    Types: Cigarettes    Quit date: 07/25/2018     Years since quitting: 4.2   Smokeless tobacco: Current  Vaping Use   Vaping Use: Never used  Substance and Sexual Activity   Alcohol use: Not Currently    Comment: quit 2003, drank about 5 beer per week prior   Drug use: Never   Sexual activity: Yes    Birth control/protection: None  Other Topics Concern   Not on file  Social History Narrative   Not on file   Social Determinants of Health   Financial Resource Strain: Low Risk  (10/16/2022)   Overall Financial Resource Strain (CARDIA)    Difficulty of Paying Living Expenses: Not hard at all  Food Insecurity: No Food Insecurity (10/16/2022)   Hunger Vital Sign    Worried About Running Out of Food in the Last Year: Never true    Ran Out of Food in the Last Year: Never true  Transportation Needs: No Transportation Needs (10/16/2022)   PRAPARE - Administrator, Civil Service (Medical): No    Lack of Transportation (Non-Medical): No  Physical Activity: Insufficiently Active (10/16/2022)   Exercise Vital Sign    Days of Exercise per Week: 3 days    Minutes of Exercise per Session: 30 min  Stress: No Stress Concern Present (10/16/2022)   [employer] of Occupational Health - Occupational Stress Questionnaire    Feeling of Stress : Not at all  Social Connections: Socially Isolated (10/16/2022)   Social Connection and Isolation Panel [NHANES]    Frequency of Communication with Friends and Family: Once a week    Frequency of Social Gatherings with Friends and Family: Once a week    Attends Religious Services: Never    Database administrator or Organizations: No    Attends Engineer, structural: Never    Marital Status: Divorced    Tobacco Counseling Ready to quit: Not Answered Counseling given: Not Answered   Clinical Intake:  Pre-visit preparation completed: Yes  Pain : No/denies pain     Nutritional Risks: None Diabetes: Yes CBG done?: No Did pt. bring in CBG monitor from home?: No  How  often do you need to have someone help you when you read instructions, pamphlets, or other written materials from your doctor or pharmacy?: 1 - Never  Diabetic?yes Nutrition Risk Assessment:  Has the patient had any N/V/D within the last 2 months?  No  Does the patient have any non-healing wounds?  No  Has the patient had any unintentional weight loss or weight gain?  No   Diabetes:  Is the patient diabetic?  Yes  If diabetic, was a CBG obtained today?  No  Did the patient bring in their glucometer from home?  No  How often do you monitor your CBG's? Every other day.   Financial Strains and Diabetes Management:  Are you having any financial strains with the device, your supplies or your medication? No .  Does the patient want to be seen by Chronic Care Management for management of their diabetes?  No  Would the patient like to be referred to a Nutritionist or for Diabetic Management?  No   Diabetic Exams:  Diabetic Eye Exam: Completed 05/14/22. Pt has been advised about the importance in completing this exam.   Diabetic Foot Exam: Completed 10/28/21. Pt has been advised about the importance in completing this exam.     Interpreter Needed?: No  Information entered by :: Kennedy Bucker, LPN   Activities of Daily Living    10/16/2022    1:42 PM 08/21/2022    1:55 PM  In your present state of health, do you have any difficulty performing the following activities:  Hearing? 1 1  Vision? 1 1  Difficulty concentrating or making decisions? 1 1  Walking or climbing stairs? 0 0  Dressing or bathing? 0 0  Doing errands, shopping? 0 0  Preparing Food and eating ? N   Using the Toilet? N   In the past six months, have you accidently leaked urine? N   Do you have problems with loss of bowel control? N   Managing your Medications? N   Managing your Finances? N   Housekeeping or managing your Housekeeping? N     Patient Care Team: Lorre Munroe, NP as PCP - General (Internal  Medicine)  Indicate any recent Medical Services you may have received from other than [facility] providers in the past year (date may be approximate).     Assessment:   This is a routine wellness examination for Bran.  Hearing/Vision screen Hearing Screening - Comments:: Has aids, but doesn't wear Vision Screening - Comments:: Wears glasses- Dr.Woodard   Dietary issues and exercise activities discussed: Current Exercise Habits: Home exercise routine, Type of exercise: walking, Time (Minutes): 30, Frequency (Times/Week): 3, Weekly Exercise (Minutes/Week): 90, Intensity: Mild   Goals Addressed             This Visit's Progress    DIET - INCREASE WATER INTAKE         Depression Screen    10/16/2022    1:36 PM 08/21/2022    1:54 PM 04/29/2022    2:03 PM 10/11/2021   10:01 AM 07/03/2021   10:12 AM 10/16/2020    2:01 PM 10/09/2020   10:43 AM  PHQ 2/9 Scores  PHQ - 2 Score 0 6 2 2 2 4  0  PHQ- 9 Score 0 17 8 5 6 11      Fall Risk    10/16/2022    1:41 PM 08/21/2022    1:54 PM 04/29/2022    2:03 PM 10/11/2021   10:04 AM 07/03/2021   10:12 AM  Fall Risk   Falls in the past year? 1 1 1 1 1   Number falls in past yr: 0 1 1 1  0  Injury with Fall? 0 0 1 0 0  Risk for fall due to : History of fall(s) No Fall Risks  History of fall(s) History of fall(s)  Follow up Falls prevention discussed;Falls evaluation completed   Falls prevention discussed Falls evaluation completed    FALL RISK PREVENTION PERTAINING TO THE HOME:  Any stairs in or around the home? No  If so, are there any without handrails? No  Home free of loose throw rugs in walkways, pet beds, electrical cords, etc? Yes  Adequate lighting in your home to reduce risk of falls? Yes   ASSISTIVE DEVICES UTILIZED TO PREVENT FALLS:  Life alert? No  Use of a cane, walker or w/c? No  Grab bars in the bathroom? Yes  Shower chair or bench in shower? No  Elevated toilet seat or a handicapped toilet? No   TIMED UP AND GO:  Was the  test performed? Yes .  Length of time to ambulate 10 feet: 4 sec.   Gait steady and fast without use of assistive device  Cognitive Function:        10/16/2022    1:48 PM 10/09/2020   10:46 AM  6CIT Screen  What Year? 0 points 0 points  What month? 0 points 0 points  What time? 0 points 0 points  Count back from 20 0 points 0 points  Months in reverse 2 points 4 points  Repeat phrase 2 points 8 points  Total Score 4 points 12 points    Immunizations Immunization History  Administered Date(s) Administered   Fluad Quad(high Dose 65+) 02/10/2019, 02/16/2020, 03/28/2021, 03/21/2022   Influenza,inj,Quad PF,6+ Mos 02/15/2018   Moderna Sars-Covid-2 Vaccination 04/26/2020   PFIZER(Purple Top)SARS-COV-2 Vaccination 07/22/2019, 08/24/2019   Pneumococcal Conjugate-13 05/03/2020   Pneumococcal Polysaccharide-23 01/28/2011, 10/28/2021   Tdap 10/08/2011    TDAP status: Due, Education has been provided regarding the importance of this vaccine. Advised may receive this vaccine at local pharmacy or Health Dept. Aware to provide a copy of the vaccination record if obtained from local pharmacy or Health Dept. Verbalized acceptance and understanding.  Flu Vaccine status: Up to date  Pneumococcal vaccine status: Up to date  Covid-19 vaccine status: Completed vaccines  Qualifies for Shingles Vaccine? Yes   Zostavax completed No   Shingrix Completed?: No.    Education has been provided regarding the importance of this vaccine. Patient has been advised to call insurance company to determine out of pocket expense if they have not yet received this vaccine. Advised may also receive vaccine at local pharmacy or Health Dept. Verbalized acceptance and understanding.  Screening Tests Health Maintenance  Topic Date Due   Zoster Vaccines- Shingrix (1 of 2) Never done   DTaP/Tdap/Td (2 - Td or Tdap) 10/07/2021   COVID-19 Vaccine (4 - 2023-24 season) 01/24/2022   Diabetic kidney evaluation - Urine  ACR  10/29/2022   FOOT EXAM  10/29/2022   HEMOGLOBIN A1C  10/29/2022   Lung Cancer Screening  12/12/2022   INFLUENZA VACCINE  12/25/2022   Diabetic kidney evaluation - eGFR measurement  04/30/2023   OPHTHALMOLOGY EXAM  05/15/2023   Medicare Annual Wellness (AWV)  10/16/2023   Colonoscopy  10/19/2029   Pneumonia Vaccine 47+ Years old  Completed   Hepatitis C Screening  Completed   HPV VACCINES  Aged Out    Health Maintenance  Health Maintenance Due  Topic Date Due   Zoster Vaccines- Shingrix (1 of 2) Never done   DTaP/Tdap/Td (2 - Td or Tdap) 10/07/2021   COVID-19 Vaccine (4 - 2023-24 season) 01/24/2022   Diabetic kidney evaluation - Urine ACR  10/29/2022    Colorectal cancer screening: Type of screening: Colonoscopy. Completed 10/20/19. Repeat every 10 years  Lung Cancer Screening: (Low Dose CT Chest recommended if Age 10-80 years, 30 pack-year currently smoking OR have quit w/in 15years.) does not qualify.   Lung Cancer Screening Referral: ordered 12/13/21  Additional Screening:  Hepatitis  C Screening: does qualify; Completed 04/29/22  Vision Screening: Recommended annual ophthalmology exams for early detection of glaucoma and other disorders of the eye. Is the patient up to date with their annual eye exam?  Yes  Who is the provider or what is the name of the office in which the patient attends annual eye exams? Dr.Woodard If pt is not established with a provider, would they like to be referred to a provider to establish care? No .   Dental Screening: Recommended annual dental exams for proper oral hygiene  Community Resource Referral / Chronic Care Management: CRR required this visit?  No   CCM required this visit?  No      Plan:     I have personally reviewed and noted the following in the patient's chart:   Medical and social history Use of alcohol, tobacco or illicit drugs  Current medications and supplements including opioid prescriptions. Patient is not  currently taking opioid prescriptions. Functional ability and status Nutritional status Physical activity Advanced directives List of other physicians Hospitalizations, surgeries, and ER visits in previous 12 months Vitals Screenings to include cognitive, depression, and falls Referrals and appointments  In addition, I have reviewed and discussed with patient certain preventive protocols, quality metrics, and best practice recommendations. A written personalized care plan for preventive services as well as general preventive health recommendations were provided to patient.     Hal Hope, LPN   6/96/2952   Nurse Notes: none

## 2022-10-16 NOTE — Telephone Encounter (Signed)
Requested Prescriptions  Pending Prescriptions Disp Refills   omeprazole (PRILOSEC) 20 MG capsule [Pharmacy Med Name: OMEPRAZOLE DR 20 MG CAP] 90 capsule 0    Sig: TAKE 1 CAPSULE BY MOUTH ONCE DAILY     Gastroenterology: Proton Pump Inhibitors Passed - 10/16/2022  2:24 PM      Passed - Valid encounter within last 12 months    Recent Outpatient Visits           1 month ago Anxiety   [place] [facility] [place], Salvadore Oxford, NP   5 months ago Aortic atherosclerosis [facility])   [place] [facility] [place], Salvadore Oxford, NP   9 months ago Benign prostatic hyperplasia with urinary hesitancy   [place] [facility] [place], Salvadore Oxford, NP   11 months ago Encounter for general adult medical examination with abnormal findings   [place] [facility] [place], Salvadore Oxford, NP   1 year ago Other chest pain   [place] [facility] [place], Salvadore Oxford, NP       Future Appointments             In 1 week Sampson Si, Salvadore Oxford, NP  [facility]

## 2022-10-29 ENCOUNTER — Encounter: Payer: Self-pay | Admitting: Internal Medicine

## 2022-10-29 ENCOUNTER — Ambulatory Visit (INDEPENDENT_AMBULATORY_CARE_PROVIDER_SITE_OTHER): Payer: 59 | Admitting: Internal Medicine

## 2022-10-29 ENCOUNTER — Other Ambulatory Visit: Payer: Self-pay | Admitting: Acute Care

## 2022-10-29 VITALS — BP 126/72 | HR 85 | Temp 95.9°F | Ht 66.0 in | Wt 154.0 lb

## 2022-10-29 DIAGNOSIS — Z122 Encounter for screening for malignant neoplasm of respiratory organs: Secondary | ICD-10-CM

## 2022-10-29 DIAGNOSIS — Z0001 Encounter for general adult medical examination with abnormal findings: Secondary | ICD-10-CM | POA: Diagnosis not present

## 2022-10-29 DIAGNOSIS — Z87891 Personal history of nicotine dependence: Secondary | ICD-10-CM

## 2022-10-29 DIAGNOSIS — Z125 Encounter for screening for malignant neoplasm of prostate: Secondary | ICD-10-CM | POA: Diagnosis not present

## 2022-10-29 DIAGNOSIS — E1169 Type 2 diabetes mellitus with other specified complication: Secondary | ICD-10-CM | POA: Diagnosis not present

## 2022-10-29 LAB — CBC
RBC: 3.91 10*6/uL — ABNORMAL LOW (ref 4.20–5.80)
WBC: 5.2 10*3/uL (ref 3.8–10.8)

## 2022-10-29 NOTE — Patient Instructions (Signed)
Health Maintenance After Age 69 After age 69, you are at a higher risk for certain long-term diseases and infections as well as injuries from falls. Falls are a major cause of broken bones and head injuries in people who are older than age 69. Getting regular preventive care can help to keep you healthy and well. Preventive care includes getting regular testing and making lifestyle changes as recommended by your health care provider. Talk with your health care provider about: Which screenings and tests you should have. A screening is a test that checks for a disease when you have no symptoms. A diet and exercise plan that is right for you. What should I know about screenings and tests to prevent falls? Screening and testing are the best ways to find a health problem early. Early diagnosis and treatment give you the best chance of managing medical conditions that are common after age 69. Certain conditions and lifestyle choices may make you more likely to have a fall. Your health care provider may recommend: Regular vision checks. Poor vision and conditions such as cataracts can make you more likely to have a fall. If you wear glasses, make sure to get your prescription updated if your vision changes. Medicine review. Work with your health care provider to regularly review all of the medicines you are taking, including over-the-counter medicines. Ask your health care provider about any side effects that may make you more likely to have a fall. Tell your health care provider if any medicines that you take make you feel dizzy or sleepy. Strength and balance checks. Your health care provider may recommend certain tests to check your strength and balance while standing, walking, or changing positions. Foot health exam. Foot pain and numbness, as well as not wearing proper footwear, can make you more likely to have a fall. Screenings, including: Osteoporosis screening. Osteoporosis is a condition that causes  the bones to get weaker and break more easily. Blood pressure screening. Blood pressure changes and medicines to control blood pressure can make you feel dizzy. Depression screening. You may be more likely to have a fall if you have a fear of falling, feel depressed, or feel unable to do activities that you used to do. Alcohol use screening. Using too much alcohol can affect your balance and may make you more likely to have a fall. Follow these instructions at home: Lifestyle Do not drink alcohol if: Your health care provider tells you not to drink. If you drink alcohol: Limit how much you have to: 0-1 drink a day for women. 0-2 drinks a day for men. Know how much alcohol is in your drink. In the [place] one drink equals one 12 oz bottle of beer (355 mL), one 5 oz glass of wine (148 mL), or one 1 oz glass of hard liquor (44 mL). Do not use any products that contain nicotine or tobacco. These products include cigarettes, chewing tobacco, and vaping devices, such as e-cigarettes. If you need help quitting, ask your health care provider. Activity  Follow a regular exercise program to stay fit. This will help you maintain your balance. Ask your health care provider what types of exercise are appropriate for you. If you need a cane or walker, use it as recommended by your health care provider. Wear supportive shoes that have nonskid soles. Safety  Remove any tripping hazards, such as rugs, cords, and clutter. Install safety equipment such as grab bars in bathrooms and safety rails on stairs. Keep rooms and walkways   well-lit. General instructions Talk with your health care provider about your risks for falling. Tell your health care provider if: You fall. Be sure to tell your health care provider about all falls, even ones that seem minor. You feel dizzy, tiredness (fatigue), or off-balance. Take over-the-counter and prescription medicines only as told by your health care provider. These include  supplements. Eat a healthy diet and maintain a healthy weight. A healthy diet includes low-fat dairy products, low-fat (lean) meats, and fiber from whole grains, beans, and lots of fruits and vegetables. Stay current with your vaccines. Schedule regular health, dental, and eye exams. Summary Having a healthy lifestyle and getting preventive care can help to protect your health and wellness after age 69. Screening and testing are the best way to find a health problem early and help you avoid having a fall. Early diagnosis and treatment give you the best chance for managing medical conditions that are more common for people who are older than age 69. Falls are a major cause of broken bones and head injuries in people who are older than age 69. Take precautions to prevent a fall at home. Work with your health care provider to learn what changes you can make to improve your health and wellness and to prevent falls. This information is not intended to replace advice given to you by your health care provider. Make sure you discuss any questions you have with your health care provider. Document Revised: 10/01/2020 Document Reviewed: 10/01/2020 Elsevier Patient Education  2024 [employer]  

## 2022-10-29 NOTE — Progress Notes (Signed)
Subjective:    Patient ID: [name], male    DOB: 09-09-1955, 67 y.o.   MRN: [id number]  HPI  Patient presents to clinic today for his annual exam.  Flu: 02/2022 Tetanus: 09/2011 COVID: X 3 Pneumovax: 10/2021 Prevnar: 04/2020 Shingrix: Never PSA screening: 10/2021 Colon screening: 09/2019 Vision screening: annually Dentist: as needed  Diet: He does eat some meat. He consumes fruits and veggies. He does eat some fried foods. He drinks mostly water. Exercise: Walking  Review of Systems     Past Medical History:  Diagnosis Date   Anxiety    COPD (chronic obstructive pulmonary disease) (HCC)    Diabetes mellitus without complication (HCC)    GERD (gastroesophageal reflux disease)    Hypertension    Insomnia    Personal history of tobacco use, presenting hazards to health 05/31/2015    Current Outpatient Medications  Medication Sig Dispense Refill   ACCU-CHEK GUIDE test strip TEST SUGAR UP TO TWICE DAILY 200 each 3   Accu-Chek Softclix Lancets lancets TEST BLOOD SUGAR UP TO TWICE DAILY 200 each 3   albuterol (PROVENTIL) (2.5 MG/3ML) 0.083% nebulizer solution Take 3 mLs (2.5 mg total) by nebulization every 6 (six) hours as needed for wheezing or shortness of breath. 75 mL 1   aspirin EC 81 MG tablet Take 1 tablet (81 mg total) by mouth daily. Swallow whole. 30 tablet 12   atorvastatin (LIPITOR) 80 MG tablet TAKE 1 TABLET BY MOUTH ONCE EVERY EVENING 90 tablet 3   Blood Glucose Monitoring Suppl (ACCU-CHEK GUIDE ME) w/Device KIT Use to check blood sugar up to 2 times daily 1 kit 0   busPIRone (BUSPAR) 15 MG tablet Take 1 tablet (15 mg total) by mouth 2 (two) times daily. (Patient not taking: Reported on 10/16/2022) 180 tablet 0   ezetimibe (ZETIA) 10 MG tablet TAKE 1 TABLET BY MOUTH ONCE DAILY 90 tablet 2   hydrochlorothiazide (MICROZIDE) 12.5 MG capsule TAKE 1 CAPSULE BY MOUTH ONCE DAILY HIGH BLOOD PRESSURE 90 capsule 0   lisinopril (ZESTRIL) 20 MG tablet TAKE 1 TABLET BY  MOUTH ONCE DAILY HIGH BLOOD PRESSURE 90 tablet 0   meclizine (ANTIVERT) 25 MG tablet TAKE 1 TABLET BY MOUTH 3 TIMES DAILY AS NEEDED FOR DIZZINESS (Patient not taking: Reported on 10/16/2022) 30 tablet 0   metFORMIN (GLUCOPHAGE) 1000 MG tablet TAKE 1 TABLET BY MOUTH TWICE DAILY WITH FOOD FOR DIABETES 180 tablet 0   omeprazole (PRILOSEC) 20 MG capsule TAKE 1 CAPSULE BY MOUTH ONCE DAILY 90 capsule 0   tamsulosin (FLOMAX) 0.4 MG CAPS capsule TAKE 1 CAPSULE BY MOUTH ONCE DAILY 30 MINUTES AFTER LARGEST MEAL. 90 capsule 1   ULTRACARE PEN NEEDLES 32G X 5 MM MISC USE AS DIRECTED. WITH INJECT 0.375MLS (0.5MG  TOTAL) SUBCUTANEOUSLY ONCE A WEEK 90 each 0   No current facility-administered medications for this visit.    Allergies  Allergen Reactions   Codeine     Other reaction(s): Vomiting    Family History  Problem Relation Age of Onset   Renal Disease Mother 18   Heart disease Father    Stroke Father    Alzheimer's disease Father    Stroke Brother    Diabetes Brother    Diabetes Maternal Grandfather     Social History   Socioeconomic History   Marital status: Divorced    Spouse name: Not on file   Number of children: 3   Years of education: Not on file   Highest education level:  10th grade  Occupational History   Occupation: retired  Tobacco Use   Smoking status: Former    Packs/day: 1.00    Years: 40.00    Additional pack years: 0.00    Total pack years: 40.00    Types: Cigarettes    Quit date: 07/25/2018    Years since quitting: 4.2   Smokeless tobacco: Current  Vaping Use   Vaping Use: Never used  Substance and Sexual Activity   Alcohol use: Not Currently    Comment: quit 2003, drank about 5 beer per week prior   Drug use: Never   Sexual activity: Yes    Birth control/protection: None  Other Topics Concern   Not on file  Social History Narrative   Not on file   Social Determinants of Health   Financial Resource Strain: Low Risk  (10/16/2022)   Overall Financial  Resource Strain (CARDIA)    Difficulty of Paying Living Expenses: Not hard at all  Food Insecurity: No Food Insecurity (10/16/2022)   Hunger Vital Sign    Worried About Running Out of Food in the Last Year: Never true    Ran Out of Food in the Last Year: Never true  Transportation Needs: No Transportation Needs (10/16/2022)   PRAPARE - Administrator, Civil Service (Medical): No    Lack of Transportation (Non-Medical): No  Physical Activity: Insufficiently Active (10/16/2022)   Exercise Vital Sign    Days of Exercise per Week: 3 days    Minutes of Exercise per Session: 30 min  Stress: No Stress Concern Present (10/16/2022)   [employer] of Occupational Health - Occupational Stress Questionnaire    Feeling of Stress : Not at all  Social Connections: Socially Isolated (10/16/2022)   Social Connection and Isolation Panel [NHANES]    Frequency of Communication with Friends and Family: Once a week    Frequency of Social Gatherings with Friends and Family: Once a week    Attends Religious Services: Never    Database administrator or Organizations: No    Attends Banker Meetings: Never    Marital Status: Divorced  Catering manager Violence: Not At Risk (10/16/2022)   Humiliation, Afraid, Rape, and Kick questionnaire    Fear of Current or Ex-Partner: No    Emotionally Abused: No    Physically Abused: No    Sexually Abused: No     Constitutional: Denies fever, malaise, fatigue, headache or abrupt weight changes.  HEENT: Denies eye pain, eye redness, ear pain, ringing in the ears, wax buildup, runny nose, nasal congestion, bloody nose, or sore throat. Respiratory: Denies difficulty breathing, shortness of breath, cough or sputum production.   Cardiovascular: Denies chest pain, chest tightness, palpitations or swelling in the hands or feet.  Gastrointestinal: Denies abdominal pain, bloating, constipation, diarrhea or blood in the stool.  GU: Patient reports urinary  hesitancy.  Denies urgency, frequency, pain with urination, burning sensation, blood in urine, odor or discharge. Musculoskeletal: Denies decrease in range of motion, difficulty with gait, muscle pain or joint pain and swelling.  Skin: Denies redness, rashes, lesions or ulcercations.  Neurological: Patient ports insomnia, paresthesia of feet.  Denies dizziness, difficulty with memory, difficulty with speech or problems with balance and coordination.  Psych: Patient has a history of anxiety.  Denies depression, SI/HI.  No other specific complaints in a complete review of systems (except as listed in HPI above).  Objective:   Physical Exam  BP 126/72 (BP Location: Left Arm, Patient  Position: Sitting, Cuff Size: Normal)   Pulse 85   Temp (!) 95.9 F (35.5 C) (Temporal)   Ht 5\' 6"  (1.676 m)   Wt 154 lb (69.9 kg)   SpO2 99%   BMI 24.86 kg/m   Wt Readings from Last 3 Encounters:  10/16/22 154 lb 9.6 oz (70.1 kg)  08/21/22 153 lb 12.8 oz (69.8 kg)  04/29/22 155 lb (70.3 kg)    General: Appears his stated age, well developed, well nourished in NAD. Skin: Warm, dry and intact. No ulcerations noted. HEENT: Head: normal shape and size; Eyes: sclera white, no icterus, conjunctiva pink, PERRLA and EOMs intact;  Neck:  Neck supple, trachea midline. No masses, lumps or thyromegaly present.  Cardiovascular: Normal rate and rhythm. S1,S2 noted.  No murmur, rubs or gallops noted. No JVD or BLE edema. No carotid bruits noted. Pulmonary/Chest: Normal effort and positive vesicular breath sounds. No respiratory distress. No wheezes, rales or ronchi noted.  Abdomen: Normal bowel sounds.  Musculoskeletal: Strength 5/5 BUE/BLE. No difficulty with gait.  Neurological: Alert and oriented. Cranial nerves II-XII grossly intact. Coordination normal.  Psychiatric: Mood and affect normal. Behavior is normal. Judgment and thought content normal.     BMET    Component Value Date/Time   NA 138 04/29/2022  1400   NA 137 02/05/2013 2355   K 4.8 04/29/2022 1400   K 3.8 02/05/2013 2355   CL 105 04/29/2022 1400   CL 107 02/05/2013 2355   CO2 24 04/29/2022 1400   CO2 23 02/05/2013 2355   GLUCOSE 199 (H) 04/29/2022 1400   GLUCOSE 113 (H) 02/05/2013 2355   BUN 20 04/29/2022 1400   BUN 9 02/05/2013 2355   CREATININE 1.38 (H) 04/29/2022 1400   CALCIUM 8.9 04/29/2022 1400   CALCIUM 8.9 02/05/2013 2355   GFRNONAA >60 07/02/2021 1241   GFRNONAA 61 02/16/2020 0922   GFRAA 70 02/16/2020 0922    Lipid Panel     Component Value Date/Time   CHOL 74 04/29/2022 1400   TRIG 215 (H) 04/29/2022 1400   HDL 27 (L) 04/29/2022 1400   CHOLHDL 2.7 04/29/2022 1400   LDLCALC 19 04/29/2022 1400    CBC    Component Value Date/Time   WBC 5.8 04/29/2022 1400   RBC 3.85 (L) 04/29/2022 1400   HGB 10.9 (L) 04/29/2022 1400   HGB 13.0 02/05/2013 2355   HCT 32.5 (L) 04/29/2022 1400   HCT 36.7 (L) 02/05/2013 2355   PLT 183 04/29/2022 1400   PLT 106 (L) 02/05/2013 2355   MCV 84.4 04/29/2022 1400   MCV 93 02/05/2013 2355   MCH 28.3 04/29/2022 1400   MCHC 33.5 04/29/2022 1400   RDW 14.9 04/29/2022 1400   RDW 13.5 02/05/2013 2355   LYMPHSABS 1,864 02/16/2020 0922   LYMPHSABS 1.8 02/01/2013 1304   MONOABS 0.4 02/01/2013 1304   EOSABS 207 02/16/2020 0922   EOSABS 0.2 02/01/2013 1304   BASOSABS 30 02/16/2020 0922   BASOSABS 0.0 02/01/2013 1304    Hgb A1C Lab Results  Component Value Date   HGBA1C 7.6 04/29/2022            Assessment & Plan:   Preventative Health Maintenance:  Encouraged him to get a flu shot in the fall He declines tetanus for financial reasons, advised him if he gets bit or cut to go get this done Pneumovax and Prevnar UTD Encouraged him to get his COVID booster Discussed Shingrix vaccine, he will check coverage with his insurance company and  schedule a visit if he would like to have this done Colon screening UTD Encouraged him to consume a balanced diet and exercise  regimen Advised him to see an eye doctor and dentist annually We will check CBC, c-Met, lipid, A1c, urine microalbumin and PSA today  RTC in 6 months, follow-up chronic conditions Nicki Reaper, NP

## 2022-10-30 LAB — HEMOGLOBIN A1C
Hgb A1c MFr Bld: 6.6 % of total Hgb — ABNORMAL HIGH (ref <5.7)
Mean Plasma Glucose: 143 mg/dL
eAG (mmol/L): 7.9 mmol/L

## 2022-10-30 LAB — LIPID PANEL
Cholesterol: 72 mg/dL (ref <200)
HDL: 37 mg/dL — ABNORMAL LOW (ref >=40)
LDL Cholesterol (Calc): 16 mg/dL (calc)
Non-HDL Cholesterol (Calc): 35 mg/dL (calc) (ref <130)
Total CHOL/HDL Ratio: 1.9 (calc) (ref <5.0)
Triglycerides: 105 mg/dL (ref <150)

## 2022-10-30 LAB — COMPLETE METABOLIC PANEL WITH GFR
AG Ratio: 1.8 (calc) (ref 1.0–2.5)
ALT: 19 U/L (ref 9–46)
AST: 20 U/L (ref 10–35)
Albumin: 4.4 g/dL (ref 3.6–5.1)
Alkaline phosphatase (APISO): 62 U/L (ref 35–144)
BUN: 21 mg/dL (ref 7–25)
CO2: 22 mmol/L (ref 20–32)
Calcium: 9 mg/dL (ref 8.6–10.3)
Chloride: 105 mmol/L (ref 98–110)
Creat: 1.28 mg/dL (ref 0.70–1.35)
Globulin: 2.5 g/dL (calc) (ref 1.9–3.7)
Glucose, Bld: 153 mg/dL — ABNORMAL HIGH (ref 65–99)
Potassium: 4.3 mmol/L (ref 3.5–5.3)
Sodium: 138 mmol/L (ref 135–146)
Total Bilirubin: 0.6 mg/dL (ref 0.2–1.2)
Total Protein: 6.9 g/dL (ref 6.1–8.1)
eGFR: 61 mL/min/{1.73_m2} (ref >=60)

## 2022-10-30 LAB — MICROALBUMIN / CREATININE URINE RATIO
Creatinine, Urine: 181 mg/dL (ref 20–320)
Microalb Creat Ratio: 9 mg/g creat (ref <30)
Microalb, Ur: 1.6 mg/dL

## 2022-10-30 LAB — PSA: PSA: 0.55 ng/mL (ref <=4.00)

## 2022-10-30 LAB — CBC
HCT: 35.8 % — ABNORMAL LOW (ref 38.5–50.0)
Hemoglobin: 11.9 g/dL — ABNORMAL LOW (ref 13.2–17.1)
MCH: 30.4 pg (ref 27.0–33.0)
MCHC: 33.2 g/dL (ref 32.0–36.0)
MCV: 91.6 fL (ref 80.0–100.0)
MPV: 9.6 fL (ref 7.5–12.5)
Platelets: 171 10*3/uL (ref 140–400)
RDW: 13.2 % (ref 11.0–15.0)

## 2022-12-15 ENCOUNTER — Ambulatory Visit
Admission: RE | Admit: 2022-12-15 | Discharge: 2022-12-15 | Disposition: A | Discharge status: Home / Self Care | Payer: 59 | Source: Ambulatory Visit | Attending: Internal Medicine | Admitting: Internal Medicine

## 2022-12-15 DIAGNOSIS — Z122 Encounter for screening for malignant neoplasm of respiratory organs: Secondary | ICD-10-CM | POA: Insufficient documentation

## 2022-12-15 DIAGNOSIS — Z87891 Personal history of nicotine dependence: Secondary | ICD-10-CM | POA: Insufficient documentation

## 2022-12-23 ENCOUNTER — Other Ambulatory Visit: Payer: Self-pay | Admitting: Acute Care

## 2022-12-23 DIAGNOSIS — Z87891 Personal history of nicotine dependence: Secondary | ICD-10-CM

## 2022-12-23 DIAGNOSIS — Z122 Encounter for screening for malignant neoplasm of respiratory organs: Secondary | ICD-10-CM

## 2023-01-12 ENCOUNTER — Other Ambulatory Visit: Payer: Self-pay | Admitting: Internal Medicine

## 2023-01-14 NOTE — Telephone Encounter (Signed)
Requested Prescriptions  Pending Prescriptions Disp Refills   tamsulosin (FLOMAX) 0.4 MG CAPS capsule [Pharmacy Med Name: TAMSULOSIN HCL 0.4 MG CAP] 90 capsule 1    Sig: TAKE 1 CAPSULE BY MOUTH ONCE DAILY 30 MINUTES AFTER LARGEST MEAL.     Urology: Alpha-Adrenergic Blocker Passed - 01/12/2023 11:23 AM      Passed - PSA in normal range and within 360 days    PSA  Date Value Ref Range Status  10/29/2022 0.55 < OR = 4.00 ng/mL Final    Comment:    The total PSA value from this assay system is  standardized against the WHO standard. The test  result will be approximately 20% lower when compared  to the equimolar-standardized total PSA (Beckman  Coulter). Comparison of serial PSA results should be  interpreted with this fact in mind. . This test was performed using the Siemens  chemiluminescent method. Values obtained from  different assay methods cannot be used interchangeably. PSA levels, regardless of value, should not be interpreted as absolute evidence of the presence or absence of disease.          Passed - Last BP in normal range    BP Readings from Last 1 Encounters:  10/29/22 126/72         Passed - Valid encounter within last 12 months    Recent Outpatient Visits           2 months ago Encounter for general adult medical examination with abnormal findings   [facility] [place], Salvadore Oxford, NP   4 months ago Anxiety   [place] [place], Salvadore Oxford, NP   8 months ago Aortic atherosclerosis [facility])   [place] [place], Salvadore Oxford, NP   1 year ago Benign prostatic hyperplasia with urinary hesitancy   [place] [facility] [place], Salvadore Oxford, NP   1 year ago Encounter for general adult medical examination with abnormal findings   [facility] [place], Salvadore Oxford, NP       Future Appointments             In 3 months Baity, Salvadore Oxford, NP [facility], [facility]

## 2023-01-20 ENCOUNTER — Other Ambulatory Visit: Payer: Self-pay | Admitting: Internal Medicine

## 2023-01-20 DIAGNOSIS — E1169 Type 2 diabetes mellitus with other specified complication: Secondary | ICD-10-CM

## 2023-01-20 DIAGNOSIS — I10 Essential (primary) hypertension: Secondary | ICD-10-CM

## 2023-01-20 DIAGNOSIS — K219 Gastro-esophageal reflux disease without esophagitis: Secondary | ICD-10-CM

## 2023-01-21 NOTE — Telephone Encounter (Signed)
Requested Prescriptions  Pending Prescriptions Disp Refills   hydrochlorothiazide (MICROZIDE) 12.5 MG capsule [Pharmacy Med Name: HYDROCHLOROTHIAZIDE 12.5 MG CAP] 90 capsule 1    Sig: TAKE 1 CAPSULE BY MOUTH ONCE DAILY HIGH BLOOD PRESSURE     Cardiovascular: Diuretics - Thiazide Passed - 01/20/2023 10:15 AM      Passed - Cr in normal range and within 180 days    Creat  Date Value Ref Range Status  10/29/2022 1.28 0.70 - 1.35 mg/dL Final   Creatinine, Urine  Date Value Ref Range Status  10/29/2022 181 20 - 320 mg/dL Final         Passed - K in normal range and within 180 days    Potassium  Date Value Ref Range Status  10/29/2022 4.3 3.5 - 5.3 mmol/L Final  02/05/2013 3.8 3.5 - 5.1 mmol/L Final         Passed - Na in normal range and within 180 days    Sodium  Date Value Ref Range Status  10/29/2022 138 135 - 146 mmol/L Final  02/05/2013 137 136 - 145 mmol/L Final         Passed - Last BP in normal range    BP Readings from Last 1 Encounters:  10/29/22 126/72         Passed - Valid encounter within last 6 months    Recent Outpatient Visits           2 months ago Encounter for general adult medical examination with abnormal findings   [place] [facility] [place], Salvadore Oxford, NP   5 months ago Anxiety   [place] [facility] [place], Salvadore Oxford, NP   8 months ago Aortic atherosclerosis [facility])   [place] [facility] [place], Salvadore Oxford, NP   1 year ago Benign prostatic hyperplasia with urinary hesitancy   [facility] [place], Salvadore Oxford, NP   1 year ago Encounter for general adult medical examination with abnormal findings   [place] [place], Salvadore Oxford, NP       Future Appointments             In 3 months Baity, Salvadore Oxford, NP [employer] [facility]             omeprazole (PRILOSEC) 20 MG capsule [Pharmacy Med Name: OMEPRAZOLE DR 20 MG CAP] 90  capsule 1    Sig: TAKE 1 CAPSULE BY MOUTH ONCE DAILY     Gastroenterology: Proton Pump Inhibitors Passed - 01/20/2023 10:15 AM      Passed - Valid encounter within last 12 months    Recent Outpatient Visits           2 months ago Encounter for general adult medical examination with abnormal findings   [facility] [place], Salvadore Oxford, NP   5 months ago Anxiety   [place] [facility] [place], Salvadore Oxford, NP   8 months ago Aortic atherosclerosis [facility])   [facility] [place], Salvadore Oxford, NP   1 year ago Benign prostatic hyperplasia with urinary hesitancy    [facility] [place], Salvadore Oxford, NP   1 year ago Encounter for general adult medical examination with abnormal findings    [facility] [place], Salvadore Oxford, NP       Future Appointments  In 3 months Baity, Salvadore Oxford, NP [facility], [facility]             lisinopril (ZESTRIL) 20 MG tablet [Pharmacy Med Name: LISINOPRIL 20 MG TAB] 90 tablet 1    Sig: TAKE 1 TABLET BY MOUTH ONCE DAILY HIGH BLOOD PRESSURE     Cardiovascular:  ACE Inhibitors Passed - 01/20/2023 10:15 AM      Passed - Cr in normal range and within 180 days    Creat  Date Value Ref Range Status  10/29/2022 1.28 0.70 - 1.35 mg/dL Final   Creatinine, Urine  Date Value Ref Range Status  10/29/2022 181 20 - 320 mg/dL Final         Passed - K in normal range and within 180 days    Potassium  Date Value Ref Range Status  10/29/2022 4.3 3.5 - 5.3 mmol/L Final  02/05/2013 3.8 3.5 - 5.1 mmol/L Final         Passed - Patient is not pregnant      Passed - Last BP in normal range    BP Readings from Last 1 Encounters:  10/29/22 126/72         Passed - Valid encounter within last 6 months    Recent Outpatient Visits           2 months ago Encounter for general adult medical examination with abnormal findings   [facility] [place], Salvadore Oxford, NP   5 months ago Anxiety   [facility] [place], Salvadore Oxford, NP   8 months ago Aortic atherosclerosis [facility])   [facility] [place], [place] W, NP   1 year ago Benign prostatic hyperplasia with urinary hesitancy   [facility] [place], [place] W, NP   1 year ago Encounter for general adult medical examination with abnormal findings   [place] [facility] [place], Salvadore Oxford, NP       Future Appointments             In 3 months Baity, Salvadore Oxford, NP [facility], [facility]             metFORMIN (GLUCOPHAGE) 1000 MG tablet [Pharmacy Med Name: METFORMIN HCL 1000 MG TAB] 180 tablet 1    Sig: TAKE 1 TABLET BY MOUTH TWICE DAILY WITH FOOD FOR DIABETES     Endocrinology:  Diabetes - Biguanides Failed - 01/20/2023 10:15 AM      Failed - B12 Level in normal range and within 720 days    No results found for: "VITAMINB12"       Failed - CBC within normal limits and completed in the last 12 months    WBC  Date Value Ref Range Status  10/29/2022 5.2 3.8 - 10.8 Thousand/uL Final   RBC  Date Value Ref Range Status  10/29/2022 3.91 (L) 4.20 - 5.80 Million/uL Final   Hemoglobin  Date Value Ref Range Status  10/29/2022 11.9 (L) 13.2 - 17.1 g/dL Final   HGB  Date Value Ref Range Status  02/05/2013 13.0 13.0 - 18.0 g/dL Final   HCT  Date Value Ref Range Status  10/29/2022 35.8 (L) 38.5 - 50.0 % Final  02/05/2013 36.7 (L) 40.0 - 52.0 % Final   MCHC  Date Value Ref Range Status  10/29/2022 33.2 32.0 - 36.0 g/dL Final   [facility]  Date Value  Ref Range Status  10/29/2022 30.4 27.0 - 33.0 pg Final   MCV  Date Value Ref Range Status  10/29/2022 91.6 80.0 - 100.0 fL Final  02/05/2013 93 80 - 100 fL Final   No results found for: "PLTCOUNTKUC", "LABPLAT", "POCPLA" RDW  Date Value Ref Range Status  10/29/2022 13.2 11.0 - 15.0 %  Final  02/05/2013 13.5 11.5 - 14.5 % Final         Passed - Cr in normal range and within 360 days    Creat  Date Value Ref Range Status  10/29/2022 1.28 0.70 - 1.35 mg/dL Final   Creatinine, Urine  Date Value Ref Range Status  10/29/2022 181 20 - 320 mg/dL Final         Passed - HBA1C is between 0 and 7.9 and within 180 days    HbA1c POC (<> result, manual entry)  Date Value Ref Range Status  04/29/2022 7.6 4.0 - 5.6 % Final   Hgb A1c MFr Bld  Date Value Ref Range Status  10/29/2022 6.6 (H) <5.7 % of total Hgb Final    Comment:    For someone without known diabetes, a hemoglobin A1c value of 6.5% or greater indicates that they may have  diabetes and this should be confirmed with a follow-up  test. . For someone with known diabetes, a value <7% indicates  that their diabetes is well controlled and a value  greater than or equal to 7% indicates suboptimal  control. A1c targets should be individualized based on  duration of diabetes, age, comorbid conditions, and  other considerations. . Currently, no consensus exists regarding use of hemoglobin A1c for diagnosis of diabetes for children. .          Passed - eGFR in normal range and within 360 days    GFR, Est African American  Date Value Ref Range Status  02/16/2020 70 > OR = 60 mL/min/1.2m2 Final   GFR, Est Non African American  Date Value Ref Range Status  02/16/2020 61 > OR = 60 mL/min/1.13m2 Final   GFR, Estimated  Date Value Ref Range Status  07/02/2021 >60 >60 mL/min Final    Comment:    (NOTE) Calculated using the CKD-EPI Creatinine Equation (2021)    eGFR  Date Value Ref Range Status  10/29/2022 61 > OR = 60 mL/min/1.51m2 Final         Passed - Valid encounter within last 6 months    Recent Outpatient Visits           2 months ago Encounter for general adult medical examination with abnormal findings   [facility] [place], Salvadore Oxford, NP   5 months ago Anxiety    [place] [place], Salvadore Oxford, NP   8 months ago Aortic atherosclerosis [facility])   [place] [facility] [place], Salvadore Oxford, NP   1 year ago Benign prostatic hyperplasia with urinary hesitancy   [place] [place], Salvadore Oxford, NP   1 year ago Encounter for general adult medical examination with abnormal findings   [place] [place] [facility] [place], Salvadore Oxford, NP       Future Appointments             In 3 months Baity, Salvadore Oxford, NP [place] [facility], [facility]

## 2023-01-30 DIAGNOSIS — H905 Unspecified sensorineural hearing loss: Secondary | ICD-10-CM | POA: Diagnosis not present

## 2023-02-27 ENCOUNTER — Other Ambulatory Visit: Payer: Self-pay | Admitting: Internal Medicine

## 2023-02-27 NOTE — Telephone Encounter (Signed)
Last RF 05/24/22 #90 3 RF    Requested Prescriptions  Refused Prescriptions Disp Refills   atorvastatin (LIPITOR) 80 MG tablet [Pharmacy Med Name: ATORVASTATIN CALCIUM 80 MG TAB] 90 tablet 3    Sig: TAKE 1 TABLET BY MOUTH ONCE EVERY EVENING     Cardiovascular:  Antilipid - Statins Failed - 02/27/2023  2:05 PM      Failed - Lipid Panel in normal range within the last 12 months    Cholesterol  Date Value Ref Range Status  10/29/2022 72 <200 mg/dL Final   LDL Cholesterol (Calc)  Date Value Ref Range Status  10/29/2022 16 mg/dL (calc) Final    Comment:    Reference range: <100 . Desirable range <100 mg/dL for primary prevention;   <70 mg/dL for patients with CHD or diabetic patients  with > or = 2 CHD risk factors. [name] Kitchen LDL-C is now calculated using the Martin-Hopkins  calculation, which is a validated novel method providing  better accuracy than the Friedewald equation in the  estimation of LDL-C.  Horald Pollen et al. Lenox Ahr. 0102;725(36): 2061-2068  (http://education.[employer].com/faq/FAQ164)    HDL  Date Value Ref Range Status  10/29/2022 37 (L) > OR = 40 mg/dL Final   Triglycerides  Date Value Ref Range Status  10/29/2022 105 <150 mg/dL Final         Passed - Patient is not pregnant      Passed - Valid encounter within last 12 months    Recent Outpatient Visits           4 months ago Encounter for general adult medical examination with abnormal findings   [place] [facility] [place], Salvadore Oxford, NP   6 months ago Anxiety   [facility] [place], Salvadore Oxford, NP   10 months ago Aortic atherosclerosis [facility])   [facility] [place], Salvadore Oxford, NP   1 year ago Benign prostatic hyperplasia with urinary hesitancy   [facility] [place], Salvadore Oxford, NP   1 year ago Encounter for general adult medical examination with abnormal findings   [facility] [place],  Salvadore Oxford, NP       Future Appointments             In 2 months Baity, Salvadore Oxford, NP [facility], [facility]

## 2023-04-13 ENCOUNTER — Other Ambulatory Visit: Payer: Self-pay | Admitting: Internal Medicine

## 2023-04-13 DIAGNOSIS — I10 Essential (primary) hypertension: Secondary | ICD-10-CM

## 2023-04-13 DIAGNOSIS — K219 Gastro-esophageal reflux disease without esophagitis: Secondary | ICD-10-CM

## 2023-04-13 DIAGNOSIS — E1169 Type 2 diabetes mellitus with other specified complication: Secondary | ICD-10-CM

## 2023-04-14 NOTE — Telephone Encounter (Signed)
Requested Prescriptions  Pending Prescriptions Disp Refills   atorvastatin (LIPITOR) 80 MG tablet [Pharmacy Med Name: ATORVASTATIN CALCIUM 80 MG TAB] 90 tablet 1    Sig: TAKE 1 TABLET BY MOUTH ONCE EVERY EVENING     Cardiovascular:  Antilipid - Statins Failed - 04/13/2023 10:12 AM      Failed - Lipid Panel in normal range within the last 12 months    Cholesterol  Date Value Ref Range Status  10/29/2022 72 <200 mg/dL Final   LDL Cholesterol (Calc)  Date Value Ref Range Status  10/29/2022 16 mg/dL (calc) Final    Comment:    Reference range: <100 . Desirable range <100 mg/dL for primary prevention;   <70 mg/dL for patients with CHD or diabetic patients  with > or = 2 CHD risk factors. [name] Kitchen LDL-C is now calculated using the Martin-Hopkins  calculation, which is a validated novel method providing  better accuracy than the Friedewald equation in the  estimation of LDL-C.  Horald Pollen et al. Lenox Ahr. 9562;130(86): 2061-2068  (http://education.[facility].com/faq/FAQ164)    HDL  Date Value Ref Range Status  10/29/2022 37 (L) > OR = 40 mg/dL Final   Triglycerides  Date Value Ref Range Status  10/29/2022 105 <150 mg/dL Final         Passed - Patient is not pregnant      Passed - Valid encounter within last 12 months    Recent Outpatient Visits           5 months ago Encounter for general adult medical examination with abnormal findings   [place] [facility] [place], Salvadore [place], NP   7 months ago Anxiety   [place] [facility] [place], Salvadore Oxford, NP   11 months ago Aortic atherosclerosis [facility])   [place] [facility] [place], Salvadore Oxford, NP   1 year ago Benign prostatic hyperplasia with urinary hesitancy   [place] [facility] [place], Salvadore Oxford, NP   1 year ago Encounter for general adult medical examination with abnormal findings   [place] [facility] [place], Salvadore Oxford, NP       Future  Appointments             In 2 weeks Sampson Si, Salvadore Oxford, NP [place] [facility], [facility]

## 2023-04-14 NOTE — Telephone Encounter (Signed)
All rxs were sent on 01/21/23 with 90 DS with 1 RF. Pt has upcoming appt as well.   Requested Prescriptions  Pending Prescriptions Disp Refills   metFORMIN (GLUCOPHAGE) 1000 MG tablet [Pharmacy Med Name: METFORMIN HCL 1000 MG TAB] 180 tablet 1    Sig: TAKE 1 TABLET BY MOUTH TWICE DAILY WITH FOOD FOR DIABETES     Endocrinology:  Diabetes - Biguanides Failed - 04/13/2023 10:12 AM      Failed - B12 Level in normal range and within 720 days    No results found for: "VITAMINB12"       Failed - CBC within normal limits and completed in the last 12 months    WBC  Date Value Ref Range Status  10/29/2022 5.2 3.8 - 10.8 Thousand/uL Final   RBC  Date Value Ref Range Status  10/29/2022 3.91 (L) 4.20 - 5.80 Million/uL Final   Hemoglobin  Date Value Ref Range Status  10/29/2022 11.9 (L) 13.2 - 17.1 g/dL Final   HGB  Date Value Ref Range Status  02/05/2013 13.0 13.0 - 18.0 g/dL Final   HCT  Date Value Ref Range Status  10/29/2022 35.8 (L) 38.5 - 50.0 % Final  02/05/2013 36.7 (L) 40.0 - 52.0 % Final   MCHC  Date Value Ref Range Status  10/29/2022 33.2 32.0 - 36.0 g/dL Final   [facility]  Date Value Ref Range Status  10/29/2022 30.4 27.0 - 33.0 pg Final   MCV  Date Value Ref Range Status  10/29/2022 91.6 80.0 - 100.0 fL Final  02/05/2013 93 80 - 100 fL Final   No results found for: "PLTCOUNTKUC", "LABPLAT", "POCPLA" RDW  Date Value Ref Range Status  10/29/2022 13.2 11.0 - 15.0 % Final  02/05/2013 13.5 11.5 - 14.5 % Final         Passed - Cr in normal range and within 360 days    Creat  Date Value Ref Range Status  10/29/2022 1.28 0.70 - 1.35 mg/dL Final   Creatinine, Urine  Date Value Ref Range Status  10/29/2022 181 20 - 320 mg/dL Final         Passed - HBA1C is between 0 and 7.9 and within 180 days    HbA1c POC (<> result, manual entry)  Date Value Ref Range Status  04/29/2022 7.6 4.0 - 5.6 % Final   Hgb A1c MFr Bld  Date Value Ref Range Status  10/29/2022 6.6 (H)  <5.7 % of total Hgb Final    Comment:    For someone without known diabetes, a hemoglobin A1c value of 6.5% or greater indicates that they may have  diabetes and this should be confirmed with a follow-up  test. . For someone with known diabetes, a value <7% indicates  that their diabetes is well controlled and a value  greater than or equal to 7% indicates suboptimal  control. A1c targets should be individualized based on  duration of diabetes, age, comorbid conditions, and  other considerations. . Currently, no consensus exists regarding use of hemoglobin A1c for diagnosis of diabetes for children. .          Passed - eGFR in normal range and within 360 days    GFR, Est African American  Date Value Ref Range Status  02/16/2020 70 > OR = 60 mL/min/1.68m2 Final   GFR, Est Non African American  Date Value Ref Range Status  02/16/2020 61 > OR = 60 mL/min/1.45m2 Final   GFR, Estimated  Date Value Ref  Range Status  07/02/2021 >60 >60 mL/min Final    Comment:    (NOTE) Calculated using the CKD-EPI Creatinine Equation (2021)    eGFR  Date Value Ref Range Status  10/29/2022 61 > OR = 60 mL/min/1.50m2 Final         Passed - Valid encounter within last 6 months    Recent Outpatient Visits           5 months ago Encounter for general adult medical examination with abnormal findings   [place] [facility] [place], Salvadore [place], NP   7 months ago Anxiety   [place] [place], Salvadore [place], NP   11 months ago Aortic atherosclerosis [facility])   [place] [facility] [place], [place] W, NP   1 year ago Benign prostatic hyperplasia with urinary hesitancy   [place] [facility] [place], [place] W, NP   1 year ago Encounter for general adult medical examination with abnormal findings   [place] [facility] [place], Salvadore Oxford, NP       Future Appointments             In 2 weeks Sampson Si, Salvadore Oxford, NP  [place] [facility]             lisinopril (ZESTRIL) 20 MG tablet [Pharmacy Med Name: LISINOPRIL 20 MG TAB] 90 tablet 1    Sig: TAKE 1 TABLET BY MOUTH ONCE DAILY HIGH BLOOD PRESSURE     Cardiovascular:  ACE Inhibitors Passed - 04/13/2023 10:12 AM      Passed - Cr in normal range and within 180 days    Creat  Date Value Ref Range Status  10/29/2022 1.28 0.70 - 1.35 mg/dL Final   Creatinine, Urine  Date Value Ref Range Status  10/29/2022 181 20 - 320 mg/dL Final         Passed - K in normal range and within 180 days    Potassium  Date Value Ref Range Status  10/29/2022 4.3 3.5 - 5.3 mmol/L Final  02/05/2013 3.8 3.5 - 5.1 mmol/L Final         Passed - Patient is not pregnant      Passed - Last BP in normal range    BP Readings from Last 1 Encounters:  10/29/22 126/72         Passed - Valid encounter within last 6 months    Recent Outpatient Visits           5 months ago Encounter for general adult medical examination with abnormal findings   [place] [facility] [place], Salvadore [place], NP   7 months ago Anxiety   [place] [facility] [place], Salvadore Oxford, NP   11 months ago Aortic atherosclerosis [facility])   [place] [facility] [place], Salvadore Oxford, NP   1 year ago Benign prostatic hyperplasia with urinary hesitancy   [place] [facility] [place], Salvadore Oxford, NP   1 year ago Encounter for general adult medical examination with abnormal findings   [place] [facility] [place], Salvadore Oxford, NP       Future Appointments             In 2 weeks Sampson Si, Salvadore Oxford, NP [place] [facility], [facility]             omeprazole (PRILOSEC) 20 MG  capsule [Pharmacy Med Name: OMEPRAZOLE DR 20 MG CAP] 90 capsule 1    Sig: TAKE 1 CAPSULE BY MOUTH ONCE DAILY     Gastroenterology: Proton Pump Inhibitors Passed - 04/13/2023 10:12 AM      Passed - Valid encounter within last  12 months    Recent Outpatient Visits           5 months ago Encounter for general adult medical examination with abnormal findings   [place] [facility] [place], Salvadore Oxford, NP   7 months ago Anxiety   [place] [facility] [place], Salvadore Oxford, NP   11 months ago Aortic atherosclerosis [facility])   [place] [facility] [place], Salvadore Oxford, NP   1 year ago Benign prostatic hyperplasia with urinary hesitancy   [place] [facility] [place], [place], NP   1 year ago Encounter for general adult medical examination with abnormal findings   [place] [facility] [place], Salvadore Oxford, NP       Future Appointments             In 2 weeks Sampson Si, Salvadore Oxford, NP [place] [facility], [facility]             hydrochlorothiazide (MICROZIDE) 12.5 MG capsule [Pharmacy Med Name: HYDROCHLOROTHIAZIDE 12.5 MG CAP] 90 capsule 1    Sig: TAKE 1 CAPSULE BY MOUTH ONCE DAILY HIGH BLOOD PRESSURE     Cardiovascular: Diuretics - Thiazide Passed - 04/13/2023 10:12 AM      Passed - Cr in normal range and within 180 days    Creat  Date Value Ref Range Status  10/29/2022 1.28 0.70 - 1.35 mg/dL Final   Creatinine, Urine  Date Value Ref Range Status  10/29/2022 181 20 - 320 mg/dL Final         Passed - K in normal range and within 180 days    Potassium  Date Value Ref Range Status  10/29/2022 4.3 3.5 - 5.3 mmol/L Final  02/05/2013 3.8 3.5 - 5.1 mmol/L Final         Passed - Na in normal range and within 180 days    Sodium  Date Value Ref Range Status  10/29/2022 138 135 - 146 mmol/L Final  02/05/2013 137 136 - 145 mmol/L Final         Passed - Last BP in normal range    BP Readings from Last 1 Encounters:  10/29/22 126/72         Passed - Valid encounter within last 6 months    Recent Outpatient Visits           5 months ago Encounter for general adult medical examination with abnormal findings   [facility] [place], Salvadore Oxford, NP   7 months ago Anxiety   [place] [place], NP   11 months ago Aortic atherosclerosis [facility])   [place] [facility] [place], Salvadore Oxford, NP   1 year ago Benign prostatic hyperplasia with urinary hesitancy   [place] [facility] [place], Salvadore Oxford, NP   1 year ago Encounter for general adult medical examination with abnormal findings   [place] [facility] [place], Salvadore [place], NP       Future Appointments             In 2 weeks [place], Rene Kocher  W, NP [facility], [facility]             ezetimibe (ZETIA) 10 MG tablet [Pharmacy Med Name: EZETIMIBE 10 MG TAB] 90 tablet 2    Sig: TAKE 1 TABLET BY MOUTH ONCE DAILY     Cardiovascular:  Antilipid - Sterol Transport Inhibitors Failed - 04/13/2023 10:12 AM      Failed - Lipid Panel in normal range within the last 12 months    Cholesterol  Date Value Ref Range Status  10/29/2022 72 <200 mg/dL Final   LDL Cholesterol (Calc)  Date Value Ref Range Status  10/29/2022 16 mg/dL (calc) Final    Comment:    Reference range: <100 . Desirable range <100 mg/dL for primary prevention;   <70 mg/dL for patients with CHD or diabetic patients  with > or = 2 CHD risk factors. [name] Kitchen LDL-C is now calculated using the Martin-Hopkins  calculation, which is a validated novel method providing  better accuracy than the Friedewald equation in the  estimation of LDL-C.  Horald Pollen et al. Lenox Ahr. 1610;960(45): 2061-2068  (http://education.[facility].com/faq/FAQ164)    HDL  Date Value Ref Range Status  10/29/2022 37 (L) > OR = 40 mg/dL Final   Triglycerides  Date Value Ref Range Status  10/29/2022 105 <150 mg/dL Final         Passed - AST in normal range and within 360 days    AST  Date Value Ref Range Status  10/29/2022 20 10 - 35 U/L Final   SGOT(AST)  Date Value Ref Range Status   02/05/2013 91 (H) 15 - 37 Unit/L Final         Passed - ALT in normal range and within 360 days    ALT  Date Value Ref Range Status  10/29/2022 19 9 - 46 U/L Final   SGPT (ALT)  Date Value Ref Range Status  02/05/2013 144 (H) 12 - 78 U/L Final         Passed - Patient is not pregnant      Passed - Valid encounter within last 12 months    Recent Outpatient Visits           5 months ago Encounter for general adult medical examination with abnormal findings   [place] [facility] [place], Salvadore [place], NP   7 months ago Anxiety   [place] [facility] [place], Salvadore [place], NP   11 months ago Aortic atherosclerosis [facility])   [place] [facility] [place], Salvadore Oxford, NP   1 year ago Benign prostatic hyperplasia with urinary hesitancy   [place] [facility] [place], Salvadore Oxford, NP   1 year ago Encounter for general adult medical examination with abnormal findings   [place] [facility] [place], Salvadore Oxford, NP       Future Appointments             In 2 weeks Sampson Si, Salvadore Oxford, NP [place] [facility], [facility]             tamsulosin (FLOMAX) 0.4 MG CAPS capsule [Pharmacy Med Name: TAMSULOSIN HCL 0.4 MG CAP] 90 capsule 1    Sig: TAKE 1 CAPSULE BY MOUTH ONCE DAILY 30 MINUTES AFTER LARGEST MEAL.     Urology: Alpha-Adrenergic Blocker Passed - 04/13/2023 10:12 AM      Passed - PSA in normal range and within 360 days    PSA  Date Value Ref Range Status  10/29/2022 0.55 < OR = 4.00 ng/mL Final    Comment:    The total PSA value from this assay system is  standardized against the WHO standard. The test  result will be approximately 20% lower when compared  to the equimolar-standardized total PSA (Beckman  Coulter). Comparison of serial PSA results should be  interpreted with this fact in mind. . This test was performed using the Siemens  chemiluminescent method. Values obtained from   different assay methods cannot be used interchangeably. PSA levels, regardless of value, should not be interpreted as absolute evidence of the presence or absence of disease.          Passed - Last BP in normal range    BP Readings from Last 1 Encounters:  10/29/22 126/72         Passed - Valid encounter within last 12 months    Recent Outpatient Visits           5 months ago Encounter for general adult medical examination with abnormal findings   [facility] [place], Salvadore Oxford, NP   7 months ago Anxiety   [place] [facility] [place], Salvadore Oxford, NP   11 months ago Aortic atherosclerosis [facility])   [place] [facility] [place], Salvadore Oxford, NP   1 year ago Benign prostatic hyperplasia with urinary hesitancy   [place] [facility] [place], Salvadore Oxford, NP   1 year ago Encounter for general adult medical examination with abnormal findings   [place] [facility] [place], Salvadore Oxford, NP       Future Appointments             In 2 weeks Sampson Si, Salvadore [place], NP [place], [facility]

## 2023-04-30 ENCOUNTER — Ambulatory Visit (INDEPENDENT_AMBULATORY_CARE_PROVIDER_SITE_OTHER): Payer: 59 | Admitting: Internal Medicine

## 2023-04-30 ENCOUNTER — Encounter: Payer: Self-pay | Admitting: Internal Medicine

## 2023-04-30 VITALS — BP 118/62 | Ht 66.0 in | Wt 153.6 lb

## 2023-04-30 DIAGNOSIS — N1831 Chronic kidney disease, stage 3a: Secondary | ICD-10-CM

## 2023-04-30 DIAGNOSIS — I10 Essential (primary) hypertension: Secondary | ICD-10-CM | POA: Diagnosis not present

## 2023-04-30 DIAGNOSIS — I700 Atherosclerosis of aorta: Secondary | ICD-10-CM

## 2023-04-30 DIAGNOSIS — D508 Other iron deficiency anemias: Secondary | ICD-10-CM

## 2023-04-30 DIAGNOSIS — R3911 Hesitancy of micturition: Secondary | ICD-10-CM

## 2023-04-30 DIAGNOSIS — E1169 Type 2 diabetes mellitus with other specified complication: Secondary | ICD-10-CM

## 2023-04-30 DIAGNOSIS — J410 Simple chronic bronchitis: Secondary | ICD-10-CM | POA: Diagnosis not present

## 2023-04-30 DIAGNOSIS — N401 Enlarged prostate with lower urinary tract symptoms: Secondary | ICD-10-CM

## 2023-04-30 DIAGNOSIS — H9211 Otorrhea, right ear: Secondary | ICD-10-CM

## 2023-04-30 DIAGNOSIS — Z23 Encounter for immunization: Secondary | ICD-10-CM

## 2023-04-30 DIAGNOSIS — F5101 Primary insomnia: Secondary | ICD-10-CM

## 2023-04-30 DIAGNOSIS — G4733 Obstructive sleep apnea (adult) (pediatric): Secondary | ICD-10-CM

## 2023-04-30 DIAGNOSIS — K219 Gastro-esophageal reflux disease without esophagitis: Secondary | ICD-10-CM

## 2023-04-30 DIAGNOSIS — E785 Hyperlipidemia, unspecified: Secondary | ICD-10-CM

## 2023-04-30 DIAGNOSIS — F419 Anxiety disorder, unspecified: Secondary | ICD-10-CM

## 2023-04-30 DIAGNOSIS — B182 Chronic viral hepatitis C: Secondary | ICD-10-CM

## 2023-04-30 LAB — POCT GLYCOSYLATED HEMOGLOBIN (HGB A1C): Hemoglobin A1C: 6.4 % — AB (ref 4.0–5.6)

## 2023-04-30 NOTE — Assessment & Plan Note (Signed)
C-Met and lipid profile today Encouraged him to consume a low-fat diet Continue atorvastatin and ezetimibe

## 2023-04-30 NOTE — Assessment & Plan Note (Signed)
Continue buspirone to 15 mg BID He declines referral to a therapist or psychiatrist at this time

## 2023-04-30 NOTE — Assessment & Plan Note (Signed)
Not medicated 

## 2023-04-30 NOTE — Patient Instructions (Signed)

## 2023-04-30 NOTE — Assessment & Plan Note (Signed)
C-Met today Continue lisinopril for renal protection 

## 2023-04-30 NOTE — Assessment & Plan Note (Signed)
CMET today 

## 2023-04-30 NOTE — Assessment & Plan Note (Signed)
CBC today.  

## 2023-04-30 NOTE — Progress Notes (Signed)
Subjective:    Patient ID: [name], male    DOB: February 19, 1956, 67 y.o.   MRN: [id number]  HPI  Patient presents to clinic today for 76-month follow-up of chronic conditions.  HTN: His BP today is 118/62.  He is taking lisinopril and hctz as prescribed.  ECG from 06/2021 reviewed.  HLD with aortic atherosclerosis: His last LDL was 16, triglycerides 295, 10/2022.  He denies myalgias on atorvastatin and ezetimibe.  He is taking aspirin.  He tries to consume a low-fat diet.  DM2: His last A1c was 6.6%, 10/2022.  He is taking metformin as prescribed.  His sugars range 96-143.  He checks his feet routinely.  His last eye exam was 03/2022.  Flu 02/2022.  Pneumovax 10/2021.  Prevnar 04/2020.  COVID Pfizer x3.  CKD 3: His last creatinine was 1.28, GFR 61, 10/2022.  He is on lisinopril for renal protection.  He does not follow with nephrology.  COPD: He denies chronic cough or shortness of breath.  He is not using any inhalers.  There are no PFTs on file.  GERD: He is not what triggers this. He denies breakthrough on omeprazole.  Upper GI from 09/2019 reviewed.  OSA: He averages 8 hours of sleep per night without the use of his CPAP.  Sleep study from 11/2019 reviewed.  BPH: He denies symptoms. He is taking flomax as prescribed.  He does not follow with urology.  Anxiety: Chronic, managed on buspirone.  He did not tolerate sertraline.  He is not currently seeing a therapist.  He denies depression, SI/HI.  Insomnia: He has difficulty falling asleep.  He is not taking any medication for this at this time.  Sleep study from 11/2019 reviewed.  Anemia: His last H/H was 11.9/35.8, 10/2022.  He is not taking any oral iron at this time.  He does not follow with hematology.  Review of Systems     Past Medical History:  Diagnosis Date   Anxiety    COPD (chronic obstructive pulmonary disease) (HCC)    Diabetes mellitus without complication (HCC)    GERD (gastroesophageal reflux disease)     Hypertension    Insomnia    Personal history of tobacco use, presenting hazards to health 05/31/2015    Current Outpatient Medications  Medication Sig Dispense Refill   ACCU-CHEK GUIDE test strip TEST SUGAR UP TO TWICE DAILY 200 each 3   Accu-Chek Softclix Lancets lancets TEST BLOOD SUGAR UP TO TWICE DAILY 200 each 3   albuterol (PROVENTIL) (2.5 MG/3ML) 0.083% nebulizer solution Take 3 mLs (2.5 mg total) by nebulization every 6 (six) hours as needed for wheezing or shortness of breath. 75 mL 1   aspirin EC 81 MG tablet Take 1 tablet (81 mg total) by mouth daily. Swallow whole. 30 tablet 12   atorvastatin (LIPITOR) 80 MG tablet TAKE 1 TABLET BY MOUTH ONCE EVERY EVENING 90 tablet 1   Blood Glucose Monitoring Suppl (ACCU-CHEK GUIDE ME) w/Device KIT Use to check blood sugar up to 2 times daily 1 kit 0   ezetimibe (ZETIA) 10 MG tablet TAKE 1 TABLET BY MOUTH ONCE DAILY 90 tablet 2   hydrochlorothiazide (MICROZIDE) 12.5 MG capsule TAKE 1 CAPSULE BY MOUTH ONCE DAILY HIGH BLOOD PRESSURE 90 capsule 1   lisinopril (ZESTRIL) 20 MG tablet TAKE 1 TABLET BY MOUTH ONCE DAILY HIGH BLOOD PRESSURE 90 tablet 1   meclizine (ANTIVERT) 25 MG tablet TAKE 1 TABLET BY MOUTH 3 TIMES DAILY AS NEEDED FOR DIZZINESS 30 tablet  0   metFORMIN (GLUCOPHAGE) 1000 MG tablet TAKE 1 TABLET BY MOUTH TWICE DAILY WITH FOOD FOR DIABETES 180 tablet 1   omeprazole (PRILOSEC) 20 MG capsule TAKE 1 CAPSULE BY MOUTH ONCE DAILY 90 capsule 1   tamsulosin (FLOMAX) 0.4 MG CAPS capsule TAKE 1 CAPSULE BY MOUTH ONCE DAILY 30 MINUTES AFTER LARGEST MEAL. 90 capsule 1   ULTRACARE PEN NEEDLES 32G X 5 MM MISC USE AS DIRECTED. WITH INJECT 0.375MLS (0.5MG  TOTAL) SUBCUTANEOUSLY ONCE A WEEK 90 each 0   No current facility-administered medications for this visit.    Allergies  Allergen Reactions   Codeine     Other reaction(s): Vomiting    Family History  Problem Relation Age of Onset   Renal Disease Mother 72   Heart disease Father    Stroke Father     Alzheimer's disease Father    Stroke Brother    Diabetes Brother    Diabetes Maternal Grandfather     Social History   Socioeconomic History   Marital status: Divorced    Spouse name: Not on file   Number of children: 3   Years of education: Not on file   Highest education level: 10th grade  Occupational History   Occupation: retired  Tobacco Use   Smoking status: Former    Current packs/day: 0.00    Average packs/day: 1 pack/day for 40.0 years (40.0 ttl pk-yrs)    Types: Cigarettes    Start date: 07/25/1978    Quit date: 07/25/2018    Years since quitting: 4.7   Smokeless tobacco: Current  Vaping Use   Vaping status: Never Used  Substance and Sexual Activity   Alcohol use: Not Currently    Comment: quit 2003, drank about 5 beer per week prior   Drug use: Never   Sexual activity: Yes    Birth control/protection: None  Other Topics Concern   Not on file  Social History Narrative   Not on file   Social Determinants of Health   Financial Resource Strain: Low Risk  (10/16/2022)   Overall Financial Resource Strain (CARDIA)    Difficulty of Paying Living Expenses: Not hard at all  Food Insecurity: No Food Insecurity (10/16/2022)   Hunger Vital Sign    Worried About Running Out of Food in the Last Year: Never true    Ran Out of Food in the Last Year: Never true  Transportation Needs: No Transportation Needs (10/16/2022)   PRAPARE - Administrator, Civil Service (Medical): No    Lack of Transportation (Non-Medical): No  Physical Activity: Insufficiently Active (10/16/2022)   Exercise Vital Sign    Days of Exercise per Week: 3 days    Minutes of Exercise per Session: 30 min  Stress: No Stress Concern Present (10/16/2022)   [employer] of Occupational Health - Occupational Stress Questionnaire    Feeling of Stress : Not at all  Social Connections: Socially Isolated (10/16/2022)   Social Connection and Isolation Panel [NHANES]    Frequency of Communication  with Friends and Family: Once a week    Frequency of Social Gatherings with Friends and Family: Once a week    Attends Religious Services: Never    Database administrator or Organizations: No    Attends Banker Meetings: Never    Marital Status: Divorced  Catering manager Violence: Not At Risk (10/16/2022)   Humiliation, Afraid, Rape, and Kick questionnaire    Fear of Current or Ex-Partner: No  Emotionally Abused: No    Physically Abused: No    Sexually Abused: No     Constitutional: Denies fever, malaise, fatigue, headache or abrupt weight changes.  HEENT: Patient reports drainage in right ear.  Denies eye pain, eye redness, ear pain, ringing in the ears, wax buildup, runny nose, nasal congestion, bloody nose, or sore throat. Respiratory: Denies difficulty breathing, shortness of breath, cough or sputum production.   Cardiovascular: Denies chest pain, chest tightness, palpitations or swelling in the hands or feet.  Gastrointestinal: Denies abdominal pain, bloating, constipation, diarrhea or blood in the stool.  GU: Denies urgency, frequency, pain with urination, burning sensation, blood in urine, odor or discharge. Musculoskeletal:  Denies decrease in range of motion, difficulty with gait, muscle pain or joint swelling.  Skin: Denies redness, rashes, lesions or ulcercations.  Neurological: Patient reports insomnia.  Denies dizziness, difficulty with memory, difficulty with speech or problems with balance and coordination.  Psych: Patient has a history of anxiety.  Denies depression, SI/HI.  No other specific complaints in a complete review of systems (except as listed in HPI above).  Objective:   Physical Exam  BP 118/62   Ht 5\' 6"  (1.676 m)   Wt 153 lb 9.6 oz (69.7 kg)   BMI 24.79 kg/m    Wt Readings from Last 3 Encounters:  10/29/22 154 lb (69.9 kg)  10/16/22 154 lb 9.6 oz (70.1 kg)  08/21/22 153 lb 12.8 oz (69.8 kg)    General: Appears his stated age, in  NAD. Skin: Warm, dry and intact. No ulcerations noted. HEENT: Head: normal shape and size; Eyes: sclera white, no icterus, conjunctiva pink, PERRLA and EOMs intact; Right Ear: Soft wax noted in the ear canal.  TM without effusion. Neck:  Neck supple, trachea midline. No masses, lumps or thyromegaly present.  Cardiovascular: Normal rate and rhythm. S1,S2 noted.  No murmur, rubs or gallops noted. No JVD or BLE edema. No carotid bruits noted. Pulmonary/Chest: Normal effort and positive vesicular breath sounds. No respiratory distress. No wheezes, rales or ronchi noted.  Abdomen: . Normal bowel sounds.  Musculoskeletal: No difficulty with gait.  Neurological: Alert and oriented. Cranial nerves II-XII grossly intact. Coordination normal.  Psychiatric: Mood and affect normal. Behavior is normal. Judgment and thought content normal.    BMET    Component Value Date/Time   NA 138 10/29/2022 1139   NA 137 02/05/2013 2355   K 4.3 10/29/2022 1139   K 3.8 02/05/2013 2355   CL 105 10/29/2022 1139   CL 107 02/05/2013 2355   CO2 22 10/29/2022 1139   CO2 23 02/05/2013 2355   GLUCOSE 153 (H) 10/29/2022 1139   GLUCOSE 113 (H) 02/05/2013 2355   BUN 21 10/29/2022 1139   BUN 9 02/05/2013 2355   CREATININE 1.28 10/29/2022 1139   CALCIUM 9.0 10/29/2022 1139   CALCIUM 8.9 02/05/2013 2355   GFRNONAA >60 07/02/2021 1241   GFRNONAA 61 02/16/2020 0922   GFRAA 70 02/16/2020 0922    Lipid Panel     Component Value Date/Time   CHOL 72 10/29/2022 1139   TRIG 105 10/29/2022 1139   HDL 37 (L) 10/29/2022 1139   CHOLHDL 1.9 10/29/2022 1139   LDLCALC 16 10/29/2022 1139    CBC    Component Value Date/Time   WBC 5.2 10/29/2022 1139   RBC 3.91 (L) 10/29/2022 1139   HGB 11.9 (L) 10/29/2022 1139   HGB 13.0 02/05/2013 2355   HCT 35.8 (L) 10/29/2022 1139   HCT 36.7 (  L) 02/05/2013 2355   PLT 171 10/29/2022 1139   PLT 106 (L) 02/05/2013 2355   MCV 91.6 10/29/2022 1139   MCV 93 02/05/2013 2355   MCH 30.4  10/29/2022 1139   MCHC 33.2 10/29/2022 1139   RDW 13.2 10/29/2022 1139   RDW 13.5 02/05/2013 2355   LYMPHSABS 1,864 02/16/2020 0922   LYMPHSABS 1.8 02/01/2013 1304   MONOABS 0.4 02/01/2013 1304   EOSABS 207 02/16/2020 0922   EOSABS 0.2 02/01/2013 1304   BASOSABS 30 02/16/2020 0922   BASOSABS 0.0 02/01/2013 1304    Hgb A1C Lab Results  Component Value Date   HGBA1C 6.6 (H) 10/29/2022           Assessment & Plan:   Drainage right ear:  This does not appear to be infection He is requesting antibiotics but I do not feel like this is appropriate Offered referral to ENT but he declined at this time  RTC in 6 months for annual exam Nicki Reaper, NP

## 2023-04-30 NOTE — Assessment & Plan Note (Signed)
Controlled on lisinopril and HCTZ Reinforced DASH diet C-Met today

## 2023-04-30 NOTE — Assessment & Plan Note (Signed)
Not taking albuterol, will discontinue He declines starting preventative therapy at this time

## 2023-04-30 NOTE — Assessment & Plan Note (Signed)
C-Met and lipid profile today Encouraged him to consume a low-fat diet Continue aspirin, atorvastatin and ezetimibe

## 2023-04-30 NOTE — Assessment & Plan Note (Signed)
-   Continue Flomax 

## 2023-04-30 NOTE — Assessment & Plan Note (Signed)
Noncompliant with CPAP use 

## 2023-04-30 NOTE — Assessment & Plan Note (Signed)
POCT A1C 6.4% Urine microalbumin has been checked within the last year Continue Metformin  Encourage low-carb diet and exercise for weight loss Encouraged routine eye exam Encourage routine foot exam Flu shot today Pneumonia vaccines UTD Encouraged him to get his COVID booster

## 2023-04-30 NOTE — Assessment & Plan Note (Signed)
Avoid foods that trigger your reflux Continue Omeprazole

## 2023-05-01 LAB — LIPID PANEL
Cholesterol: 93 mg/dL (ref <200)
HDL: 39 mg/dL — ABNORMAL LOW (ref >=40)
LDL Cholesterol (Calc): 30 mg/dL
Non-HDL Cholesterol (Calc): 54 mg/dL (ref <130)
Total CHOL/HDL Ratio: 2.4 (calc) (ref <5.0)
Triglycerides: 162 mg/dL — ABNORMAL HIGH (ref <150)

## 2023-05-01 LAB — COMPLETE METABOLIC PANEL WITH GFR
AG Ratio: 1.4 (calc) (ref 1.0–2.5)
ALT: 23 U/L (ref 9–46)
AST: 21 U/L (ref 10–35)
Albumin: 4.2 g/dL (ref 3.6–5.1)
Alkaline phosphatase (APISO): 59 U/L (ref 35–144)
BUN: 20 mg/dL (ref 7–25)
CO2: 25 mmol/L (ref 20–32)
Calcium: 8.7 mg/dL (ref 8.6–10.3)
Chloride: 103 mmol/L (ref 98–110)
Creat: 1.23 mg/dL (ref 0.70–1.35)
Globulin: 3 g/dL (ref 1.9–3.7)
Glucose, Bld: 226 mg/dL — ABNORMAL HIGH (ref 65–139)
Potassium: 4.7 mmol/L (ref 3.5–5.3)
Sodium: 138 mmol/L (ref 135–146)
Total Bilirubin: 0.7 mg/dL (ref 0.2–1.2)
Total Protein: 7.2 g/dL (ref 6.1–8.1)
eGFR: 64 mL/min/{1.73_m2} (ref >=60)

## 2023-05-01 LAB — CBC
HCT: 35.8 % — ABNORMAL LOW (ref 38.5–50.0)
Hemoglobin: 12.2 g/dL — ABNORMAL LOW (ref 13.2–17.1)
MCH: 30.7 pg (ref 27.0–33.0)
MCHC: 34.1 g/dL (ref 32.0–36.0)
MCV: 90.2 fL (ref 80.0–100.0)
MPV: 10.4 fL (ref 7.5–12.5)
Platelets: 166 10*3/uL (ref 140–400)
RBC: 3.97 10*6/uL — ABNORMAL LOW (ref 4.20–5.80)
RDW: 13 % (ref 11.0–15.0)
WBC: 4.9 10*3/uL (ref 3.8–10.8)

## 2023-07-17 ENCOUNTER — Other Ambulatory Visit: Payer: Self-pay | Admitting: Internal Medicine

## 2023-07-17 DIAGNOSIS — K219 Gastro-esophageal reflux disease without esophagitis: Secondary | ICD-10-CM

## 2023-07-17 DIAGNOSIS — E1169 Type 2 diabetes mellitus with other specified complication: Secondary | ICD-10-CM

## 2023-07-17 DIAGNOSIS — I10 Essential (primary) hypertension: Secondary | ICD-10-CM

## 2023-07-20 NOTE — Telephone Encounter (Signed)
 Requested Prescriptions  Pending Prescriptions Disp Refills   metFORMIN (GLUCOPHAGE) 1000 MG tablet [Pharmacy Med Name: METFORMIN HCL 1000 MG TAB] 180 tablet 1    Sig: TAKE 1 TABLET BY MOUTH TWICE DAILY WITH FOOD FOR DIABETES     Endocrinology:  Diabetes - Biguanides Failed - 07/20/2023  9:35 AM      Failed - B12 Level in normal range and within 720 days    No results found for: "VITAMINB12"       Failed - CBC within normal limits and completed in the last 12 months    WBC  Date Value Ref Range Status  04/30/2023 4.9 3.8 - 10.8 Thousand/uL Final   RBC  Date Value Ref Range Status  04/30/2023 3.97 (L) 4.20 - 5.80 Million/uL Final   Hemoglobin  Date Value Ref Range Status  04/30/2023 12.2 (L) 13.2 - 17.1 g/dL Final   HGB  Date Value Ref Range Status  02/05/2013 13.0 13.0 - 18.0 g/dL Final   HCT  Date Value Ref Range Status  04/30/2023 35.8 (L) 38.5 - 50.0 % Final  02/05/2013 36.7 (L) 40.0 - 52.0 % Final   MCHC  Date Value Ref Range Status  04/30/2023 34.1 32.0 - 36.0 g/dL Final    Comment:    For adults, a slight decrease in the calculated MCHC value (in the range of 30 to 32 g/dL) is most likely not clinically significant; however, it should be interpreted with caution in correlation with other red cell parameters and the patient's clinical condition.    [facility]  Date Value Ref Range Status  04/30/2023 30.7 27.0 - 33.0 pg Final   MCV  Date Value Ref Range Status  04/30/2023 90.2 80.0 - 100.0 fL Final  02/05/2013 93 80 - 100 fL Final   No results found for: "PLTCOUNTKUC", "LABPLAT", "POCPLA" RDW  Date Value Ref Range Status  04/30/2023 13.0 11.0 - 15.0 % Final  02/05/2013 13.5 11.5 - 14.5 % Final         Passed - Cr in normal range and within 360 days    Creat  Date Value Ref Range Status  04/30/2023 1.23 0.70 - 1.35 mg/dL Final   Creatinine, Urine  Date Value Ref Range Status  10/29/2022 181 20 - 320 mg/dL Final         Passed - HBA1C is between 0 and  7.9 and within 180 days    Hemoglobin A1C  Date Value Ref Range Status  04/30/2023 6.4 (A) 4.0 - 5.6 % Final   HbA1c POC (<> result, manual entry)  Date Value Ref Range Status  04/29/2022 7.6 4.0 - 5.6 % Final   Hgb A1c MFr Bld  Date Value Ref Range Status  10/29/2022 6.6 (H) <5.7 % of total Hgb Final    Comment:    For someone without known diabetes, a hemoglobin A1c value of 6.5% or greater indicates that they may have  diabetes and this should be confirmed with a follow-up  test. . For someone with known diabetes, a value <7% indicates  that their diabetes is well controlled and a value  greater than or equal to 7% indicates suboptimal  control. A1c targets should be individualized based on  duration of diabetes, age, comorbid conditions, and  other considerations. . Currently, no consensus exists regarding use of hemoglobin A1c for diagnosis of diabetes for children. .          Passed - eGFR in normal range and within 360 days  GFR, Est African American  Date Value Ref Range Status  02/16/2020 70 > OR = 60 mL/min/1.65m2 Final   GFR, Est Non African American  Date Value Ref Range Status  02/16/2020 61 > OR = 60 mL/min/1.15m2 Final   GFR, Estimated  Date Value Ref Range Status  07/02/2021 >60 >60 mL/min Final    Comment:    (NOTE) Calculated using the CKD-EPI Creatinine Equation (2021)    eGFR  Date Value Ref Range Status  04/30/2023 64 > OR = 60 mL/min/1.56m2 Final         Passed - Valid encounter within last 6 months    Recent Outpatient Visits           2 months ago Type 2 diabetes mellitus with other specified complication, without long-term current use of insulin [facility])   [place] [facility] [place], Salvadore Oxford, NP   8 months ago Encounter for general adult medical examination with abnormal findings   [employer] [facility] [place], Salvadore Oxford, NP   11 months ago Anxiety   [place] [facility] [place],  Salvadore Oxford, NP   1 year ago Aortic atherosclerosis [facility])   [place] [facility] [place], Salvadore Oxford, NP   1 year ago Benign prostatic hyperplasia with urinary hesitancy   [place] [facility] [place], Salvadore Oxford, NP       Future Appointments             In 3 months Baity, Salvadore Oxford, NP [place] [facility]             tamsulosin (FLOMAX) 0.4 MG CAPS capsule [Pharmacy Med Name: TAMSULOSIN HCL 0.4 MG CAP] 90 capsule 1    Sig: TAKE 1 CAPSULE BY MOUTH ONCE DAILY 30 MINUTES AFTER LARGEST MEAL.     Urology: Alpha-Adrenergic Blocker Passed - 07/20/2023  9:35 AM      Passed - PSA in normal range and within 360 days    PSA  Date Value Ref Range Status  10/29/2022 0.55 < OR = 4.00 ng/mL Final    Comment:    The total PSA value from this assay system is  standardized against the WHO standard. The test  result will be approximately 20% lower when compared  to the equimolar-standardized total PSA (Beckman  Coulter). Comparison of serial PSA results should be  interpreted with this fact in mind. . This test was performed using the Siemens  chemiluminescent method. Values obtained from  different assay methods cannot be used interchangeably. PSA levels, regardless of value, should not be interpreted as absolute evidence of the presence or absence of disease.          Passed - Last BP in normal range    BP Readings from Last 1 Encounters:  04/30/23 118/62         Passed - Valid encounter within last 12 months    Recent Outpatient Visits           2 months ago Type 2 diabetes mellitus with other specified complication, without long-term current use of insulin [facility])   [place] [facility] [place], Salvadore Oxford, NP   8 months ago Encounter for general adult medical examination with abnormal findings   [place] [facility] [place], Salvadore Oxford, NP   11 months ago Anxiety   [place], Salvadore Oxford, [place]  1 year ago Aortic atherosclerosis [facility])   [place] [facility] [place], [place] W, NP   1 year ago Benign prostatic hyperplasia with urinary hesitancy   [place] [facility] [place], Salvadore Oxford, NP       Future Appointments             In 3 months Baity, Salvadore Oxford, NP [place] [facility], [facility]             omeprazole (PRILOSEC) 20 MG capsule [place] Med Name: OMEPRAZOLE DR 20 MG CAP] 90 capsule 1    Sig: TAKE 1 CAPSULE BY MOUTH ONCE DAILY     Gastroenterology: Proton Pump Inhibitors Passed - 07/20/2023  9:35 AM      Passed - Valid encounter within last 12 months    Recent Outpatient Visits           2 months ago Type 2 diabetes mellitus with other specified complication, without long-term current use of insulin (HCC)   [place] [facility] [place], [place] W, NP   8 months ago Encounter for general adult medical examination with abnormal findings   [place] [facility] [place], Salvadore Oxford, NP   11 months ago Anxiety   [place] [facility] [place], Salvadore Oxford, NP   1 year ago Aortic atherosclerosis [facility])   [place] [facility] [place], Salvadore Oxford, NP   1 year ago Benign prostatic hyperplasia with urinary hesitancy   [facility] [place], Salvadore Oxford, NP       Future Appointments             In 3 months Baity, Salvadore Oxford, NP [place] [facility], [facility]             hydrochlorothiazide (MICROZIDE) 12.5 MG capsule [Pharmacy Med Name: HYDROCHLOROTHIAZIDE 12.5 MG CAP] 90 capsule 1    Sig: TAKE 1 CAPSULE BY MOUTH ONCE DAILY HIGH BLOOD PRESSURE     Cardiovascular: Diuretics - Thiazide Passed - 07/20/2023  9:35 AM      Passed - Cr in normal range and within 180 days    Creat  Date Value Ref Range Status  04/30/2023 1.23 0.70 - 1.35 mg/dL Final   Creatinine, Urine  Date Value Ref Range  Status  10/29/2022 181 20 - 320 mg/dL Final         Passed - K in normal range and within 180 days    Potassium  Date Value Ref Range Status  04/30/2023 4.7 3.5 - 5.3 mmol/L Final  02/05/2013 3.8 3.5 - 5.1 mmol/L Final         Passed - Na in normal range and within 180 days    Sodium  Date Value Ref Range Status  04/30/2023 138 135 - 146 mmol/L Final  02/05/2013 137 136 - 145 mmol/L Final         Passed - Last BP in normal range    BP Readings from Last 1 Encounters:  04/30/23 118/62         Passed - Valid encounter within last 6 months    Recent Outpatient Visits           2 months ago Type 2 diabetes mellitus with other specified complication, without long-term current use of insulin [facility])   [place] [facility] [facility] [place], Salvadore Oxford, NP   8 months ago Encounter for general  adult medical examination with abnormal findings   [facility] [place], [place], [place]   11 months ago Anxiety   [place] [facility] [place], [place] W, NP   1 year ago Aortic atherosclerosis [facility])   [place] [facility] [place], Salvadore Oxford, NP   1 year ago Benign prostatic hyperplasia with urinary hesitancy   [place] [facility] [place], Salvadore Oxford, NP       Future Appointments             In 3 months Sam[place], Salvadore Oxford, NP [place] [facility], [facility]             lisinopril (ZESTRIL) 20 MG tablet [Pharmacy Med Name: LISINOPRIL 20 MG TAB] 90 tablet 1    Sig: TAKE 1 TABLET BY MOUTH ONCE DAILY HIGH BLOOD PRESSURE     Cardiovascular:  ACE Inhibitors Passed - 07/20/2023  9:35 AM      Passed - Cr in normal range and within 180 days    Creat  Date Value Ref Range Status  04/30/2023 1.23 0.70 - 1.35 mg/dL Final   Creatinine, Urine  Date Value Ref Range Status  10/29/2022 181 20 - 320 mg/dL Final         Passed - K in normal range and within 180 days    Potassium  Date Value Ref Range  Status  04/30/2023 4.7 3.5 - 5.3 mmol/L Final  02/05/2013 3.8 3.5 - 5.1 mmol/L Final         Passed - Patient is not pregnant      Passed - Last BP in normal range    BP Readings from Last 1 Encounters:  04/30/23 118/62         Passed - Valid encounter within last 6 months    Recent Outpatient Visits           2 months ago Type 2 diabetes mellitus with other specified complication, without long-term current use of insulin (HCC)   [place] [facility] [place], Salvadore Oxford, NP   8 months ago Encounter for general adult medical examination with abnormal findings   [facility] [place], Salvadore Oxford, NP   11 months ago Anxiety   [place] [facility] [place], Salvadore Oxford, NP   1 year ago Aortic atherosclerosis [facility])   [place] [facility] [place], Salvadore Oxford, NP   1 year ago Benign prostatic hyperplasia with urinary hesitancy   [place] [facility] [place], Salvadore Oxford, NP       Future Appointments             In 3 months Sampson Si, Salvadore Oxford, NP [place] [facility], [facility]             ezetimibe (ZETIA) 10 MG tablet [Pharmacy Med Name: EZETIMIBE 10 MG TAB] 90 tablet 1    Sig: TAKE 1 TABLET BY MOUTH ONCE DAILY     Cardiovascular:  Antilipid - Sterol Transport Inhibitors Failed - 07/20/2023  9:35 AM      Failed - Lipid Panel in normal range within the last 12 months    Cholesterol  Date Value Ref Range Status  04/30/2023 93 <200 mg/dL Final   LDL Cholesterol (Calc)  Date Value Ref Range Status  04/30/2023 30 mg/dL (calc) Final    Comment:    Reference range: <100 .  Desirable range <100 mg/dL for primary prevention;   <70 mg/dL for patients with CHD or diabetic patients  with > or = 2 CHD risk factors. [name] Kitchen LDL-C is now calculated using the Martin-Hopkins  calculation, which is a validated novel method providing  better accuracy than the Friedewald equation in the  estimation of  LDL-C.  Horald Pollen et al. Lenox Ahr. 1610;960(45): 2061-2068  (http://education.[employer].com/faq/FAQ164)    HDL  Date Value Ref Range Status  04/30/2023 39 (L) > OR = 40 mg/dL Final   Triglycerides  Date Value Ref Range Status  04/30/2023 162 (H) <150 mg/dL Final         Passed - AST in normal range and within 360 days    AST  Date Value Ref Range Status  04/30/2023 21 10 - 35 U/L Final   SGOT(AST)  Date Value Ref Range Status  02/05/2013 91 (H) 15 - 37 Unit/L Final         Passed - ALT in normal range and within 360 days    ALT  Date Value Ref Range Status  04/30/2023 23 9 - 46 U/L Final   SGPT (ALT)  Date Value Ref Range Status  02/05/2013 144 (H) 12 - 78 U/L Final         Passed - Patient is not pregnant      Passed - Valid encounter within last 12 months    Recent Outpatient Visits           2 months ago Type 2 diabetes mellitus with other specified complication, without long-term current use of insulin [facility])   [place] [facility] [place], Salvadore Oxford, NP   8 months ago Encounter for general adult medical examination with abnormal findings   [place] [place], Salvadore Oxford, NP   11 months ago Anxiety   [place] [facility] [place], Salvadore Oxford, NP   1 year ago Aortic atherosclerosis [facility])   [place] [facility] [place], Salvadore Oxford, NP   1 year ago Benign prostatic hyperplasia with urinary hesitancy   [place] [facility] [place], Salvadore Oxford, NP       Future Appointments             In 3 months Baity, Salvadore Oxford, NP [place] [facility]

## 2023-08-03 ENCOUNTER — Telehealth: Payer: Self-pay

## 2023-08-03 NOTE — Telephone Encounter (Signed)
 Copied from CRM [id number]. Topic: General - Other >> Aug 03, 2023  9:19 AM Fuller Mandril wrote: Reason for CRM: Patient called and wanted to advise that he would no longer be a patient with this practice. States he has a new provider. Clinic in [place]. Did not have any other questions or concerns. Thank You

## 2023-10-24 IMAGING — CR DG CHEST 2V
1 series · 2 of 2 positions shown · non-contrast
Comparison: Two-view chest x-ray 02/01/2013

CLINICAL DATA: Central chest pain.  Nausea and dizziness.

EXAM:
CHEST - 2 VIEW

[Series 1: dg chest 2 view · 0.14mm/px · 2 of 2 slices shown]
[im 1/2]
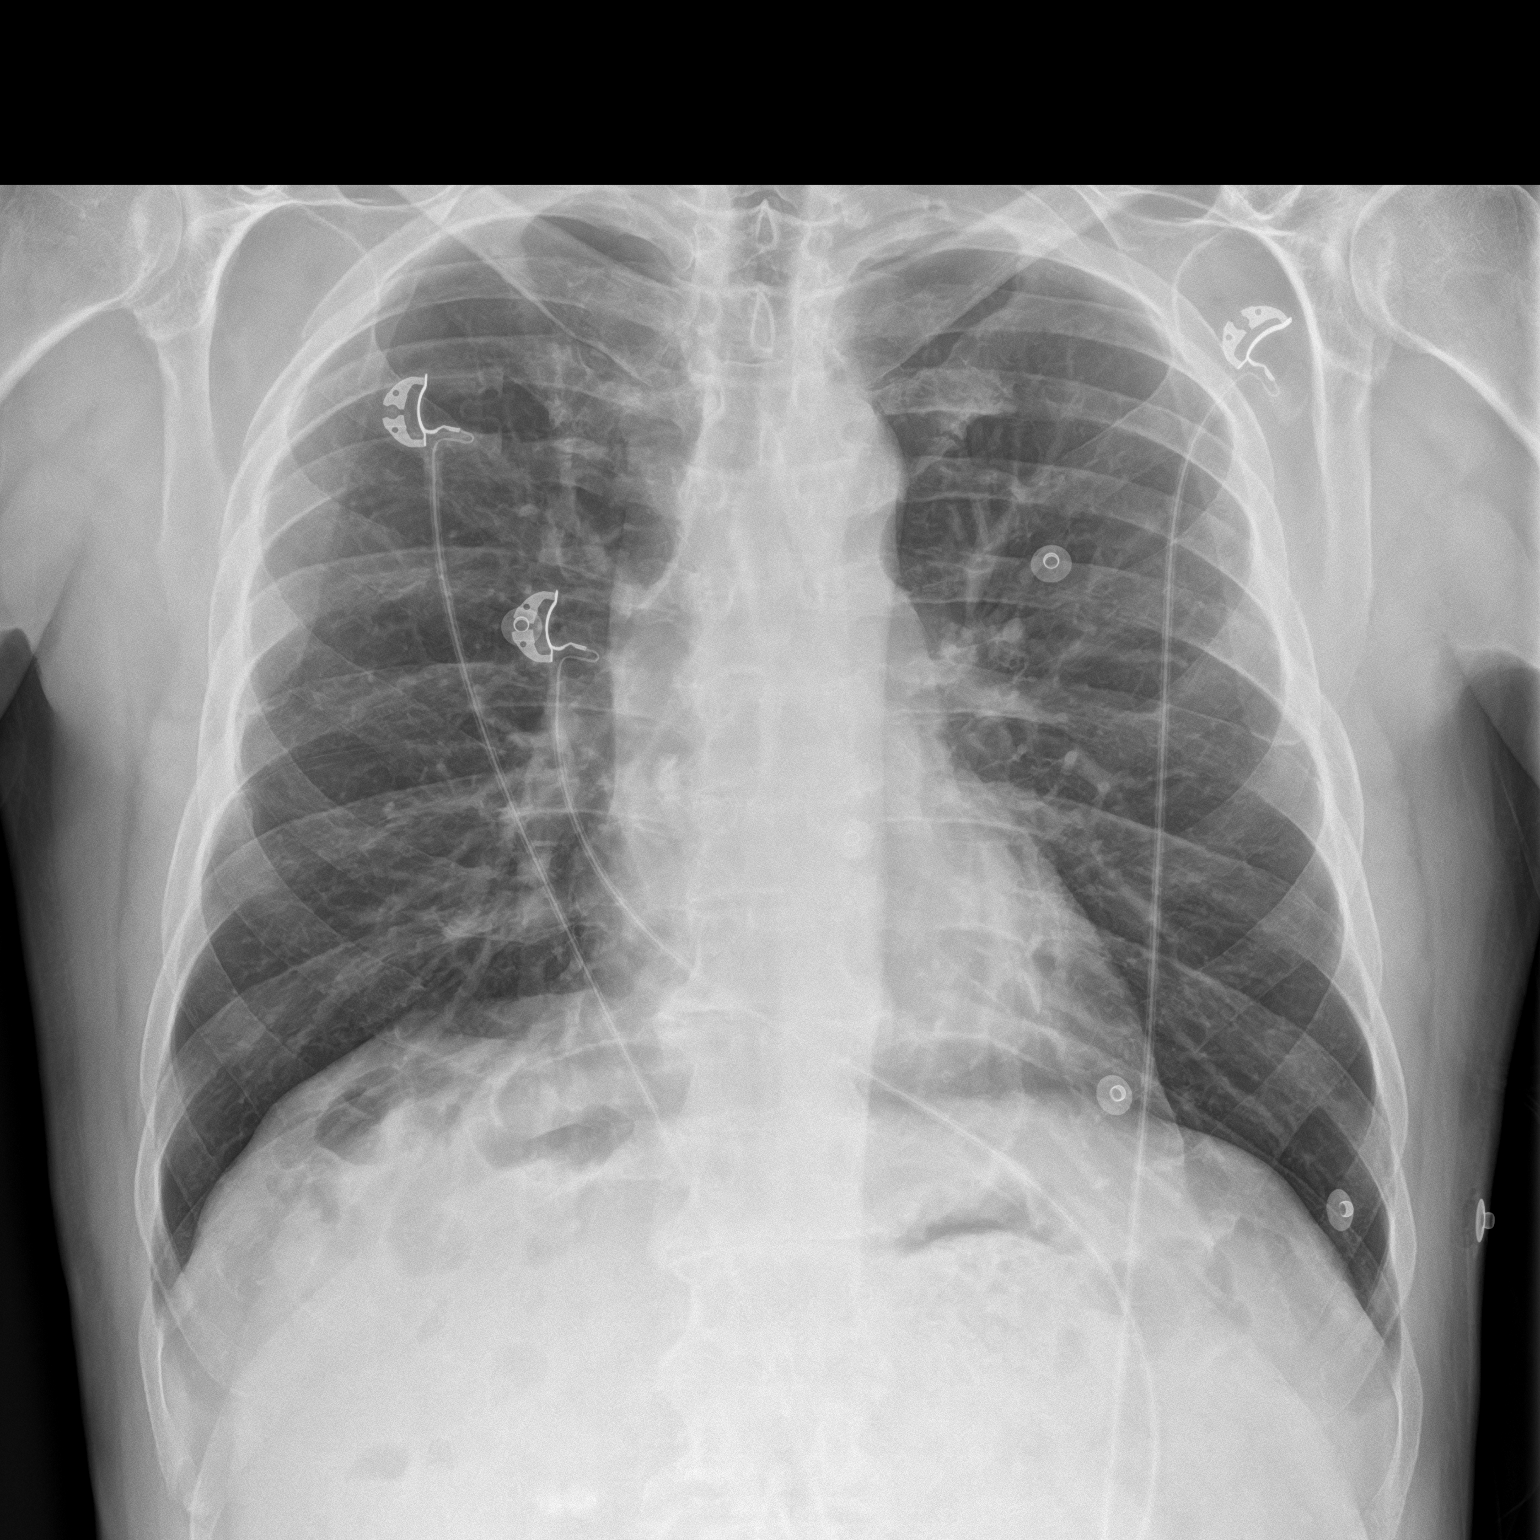
[im 2/2]
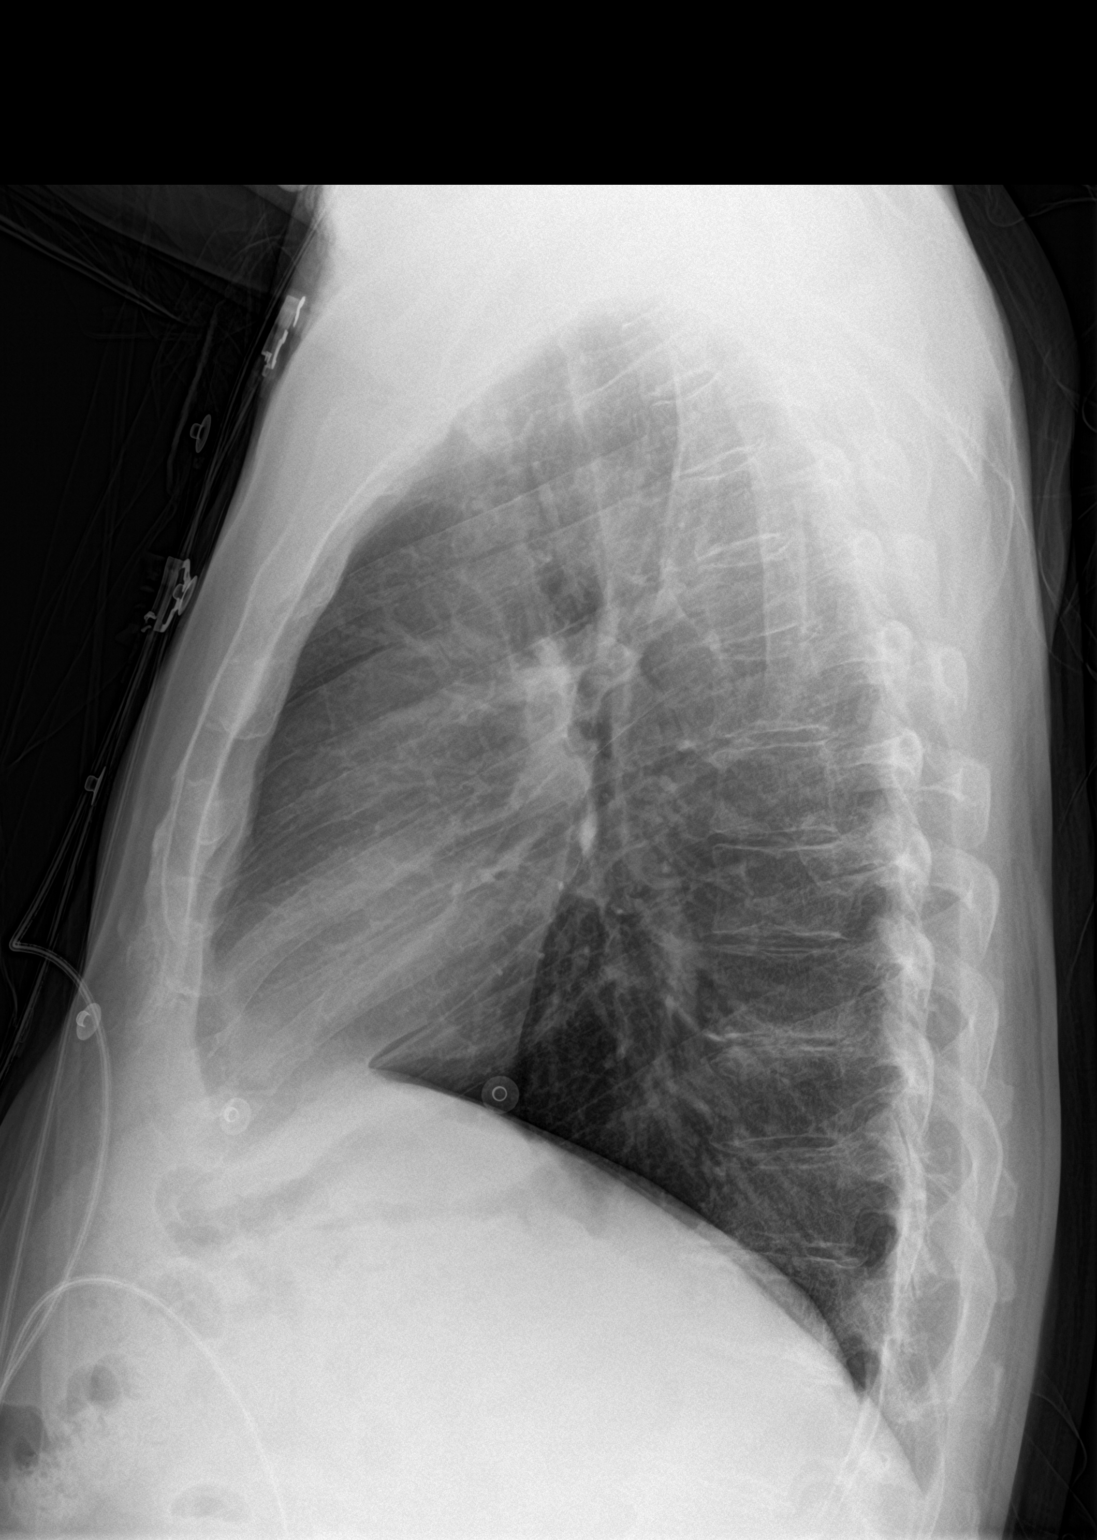

[2 of 2 positions shown; findings below may reference images not displayed]

FINDINGS: The heart size and mediastinal contours are within normal limits.
Both lungs are clear. The visualized skeletal structures are
unremarkable.
IMPRESSION: No active cardiopulmonary disease.

## 2023-10-30 ENCOUNTER — Encounter: Payer: Self-pay | Admitting: Internal Medicine

## 2023-10-30 NOTE — Progress Notes (Deleted)
 Subjective:    Patient ID: [name], male    DOB: Feb 07, 1956, 68 y.o.   MRN: [id number]  HPI  Patient presents to clinic today for his annual exam.  Flu: 04/2023 Tetanus: 09/2011 COVID: X 3 Pneumovax: 10/2021 Prevnar: 04/2020 Shingrix: Never PSA screening: 10/2022 Colon screening: 09/2019 Vision screening: annually Dentist: as needed  Diet: He does eat some meat. He consumes fruits and veggies. He does eat some fried foods. He drinks mostly water. Exercise: Walking  Review of Systems     Past Medical History:  Diagnosis Date   Anxiety    COPD (chronic obstructive pulmonary disease) (HCC)    Diabetes mellitus without complication (HCC)    GERD (gastroesophageal reflux disease)    Hypertension    Insomnia    Personal history of tobacco use, presenting hazards to health 05/31/2015    Current Outpatient Medications  Medication Sig Dispense Refill   ACCU-CHEK GUIDE test strip TEST SUGAR UP TO TWICE DAILY 200 each 3   Accu-Chek Softclix Lancets lancets TEST BLOOD SUGAR UP TO TWICE DAILY 200 each 3   albuterol  (PROVENTIL ) (2.5 MG/3ML) 0.083% nebulizer solution Take 3 mLs (2.5 mg total) by nebulization every 6 (six) hours as needed for wheezing or shortness of breath. 75 mL 1   aspirin  EC 81 MG tablet Take 1 tablet (81 mg total) by mouth daily. Swallow whole. 30 tablet 12   atorvastatin  (LIPITOR) 80 MG tablet TAKE 1 TABLET BY MOUTH ONCE EVERY EVENING 90 tablet 1   Blood Glucose Monitoring Suppl (ACCU-CHEK GUIDE ME) w/Device KIT Use to check blood sugar up to 2 times daily 1 kit 0   ezetimibe  (ZETIA ) 10 MG tablet TAKE 1 TABLET BY MOUTH ONCE DAILY 90 tablet 1   hydrochlorothiazide  (MICROZIDE ) 12.5 MG capsule TAKE 1 CAPSULE BY MOUTH ONCE DAILY HIGH BLOOD PRESSURE 90 capsule 1   lisinopril  (ZESTRIL ) 20 MG tablet TAKE 1 TABLET BY MOUTH ONCE DAILY HIGH BLOOD PRESSURE 90 tablet 1   meclizine  (ANTIVERT ) 25 MG tablet TAKE 1 TABLET BY MOUTH 3 TIMES DAILY AS NEEDED FOR DIZZINESS 30  tablet 0   metFORMIN  (GLUCOPHAGE ) 1000 MG tablet TAKE 1 TABLET BY MOUTH TWICE DAILY WITH FOOD FOR DIABETES 180 tablet 1   omeprazole  (PRILOSEC) 20 MG capsule TAKE 1 CAPSULE BY MOUTH ONCE DAILY 90 capsule 1   tamsulosin  (FLOMAX ) 0.4 MG CAPS capsule TAKE 1 CAPSULE BY MOUTH ONCE DAILY 30 MINUTES AFTER LARGEST MEAL. 90 capsule 1   ULTRACARE PEN NEEDLES 32G X 5 MM MISC USE AS DIRECTED. WITH INJECT 0.375MLS (0.5MG  TOTAL) SUBCUTANEOUSLY ONCE A WEEK 90 each 0   No current facility-administered medications for this visit.    Allergies  Allergen Reactions   Codeine     Other reaction(s): Vomiting    Family History  Problem Relation Age of Onset   Renal Disease Mother 53   Heart disease Father    Stroke Father    Alzheimer's disease Father    Stroke Brother    Diabetes Brother    Diabetes Maternal Grandfather     Social History   Socioeconomic History   Marital status: Divorced    Spouse name: Not on file   Number of children: 3   Years of education: Not on file   Highest education level: 10th grade  Occupational History   Occupation: retired  Tobacco Use   Smoking status: Former    Current packs/day: 0.00    Average packs/day: 1 pack/day for 40.0 years (40.0  ttl pk-yrs)    Types: Cigarettes    Start date: 07/25/1978    Quit date: 07/25/2018    Years since quitting: 5.2   Smokeless tobacco: Current  Vaping Use   Vaping status: Never Used  Substance and Sexual Activity   Alcohol use: Not Currently    Comment: quit 2003, drank about 5 beer per week prior   Drug use: Never   Sexual activity: Yes    Birth control/protection: None  Other Topics Concern   Not on file  Social History Narrative   Not on file   Social Drivers of Health   Financial Resource Strain: Low Risk  (07/30/2023)   Received from [facility]   Overall Financial Resource Strain (CARDIA)    Difficulty of Paying Living Expenses: Not hard at all  Food Insecurity: No Food Insecurity (07/30/2023)    Received from [facility]   Hunger Vital Sign    Worried About Running Out of Food in the Last Year: Never true    Ran Out of Food in the Last Year: Never true  Transportation Needs: No Transportation Needs (07/30/2023)   Received from [facility] - Transportation    In the past 12 months, has lack of transportation kept you from medical appointments or from getting medications?: No    Lack of Transportation (Non-Medical): No  Physical Activity: Insufficiently Active (04/30/2023)   Exercise Vital Sign    Days of Exercise per Week: 2 days    Minutes of Exercise per Session: 10 min  Stress: No Stress Concern Present (04/30/2023)   [employer] of Occupational Health - Occupational Stress Questionnaire    Feeling of Stress : Not at all  Social Connections: Socially Isolated (10/16/2022)   Social Connection and Isolation Panel [NHANES]    Frequency of Communication with Friends and Family: Once a week    Frequency of Social Gatherings with Friends and Family: Once a week    Attends Religious Services: Never    Database administrator or Organizations: No    Attends Banker Meetings: Never    Marital Status: Divorced  Catering manager Violence: Not At Risk (04/30/2023)   Humiliation, Afraid, Rape, and Kick questionnaire    Fear of Current or Ex-Partner: No    Emotionally Abused: No    Physically Abused: No    Sexually Abused: No     Constitutional: Denies fever, malaise, fatigue, headache or abrupt weight changes.  HEENT: Denies eye pain, eye redness, ear pain, ringing in the ears, wax buildup, runny nose, nasal congestion, bloody nose, or sore throat. Respiratory: Denies difficulty breathing, shortness of breath, cough or sputum production.   Cardiovascular: Denies chest pain, chest tightness, palpitations or swelling in the hands or feet.  Gastrointestinal: Denies abdominal pain, bloating, constipation, diarrhea or blood in  the stool.  GU: Patient reports urinary hesitancy.  Denies urgency, frequency, pain with urination, burning sensation, blood in urine, odor or discharge. Musculoskeletal: Denies decrease in range of motion, difficulty with gait, muscle pain or joint pain and swelling.  Skin: Denies redness, rashes, lesions or ulcercations.  Neurological: Patient reports insomnia, paresthesia of feet.  Denies dizziness, difficulty with memory, difficulty with speech or problems with balance and coordination.  Psych: Patient has a history of anxiety.  Denies depression, SI/HI.  No other specific complaints in a complete review of systems (except as listed in HPI above).  Objective:   Physical Exam  There were  no vitals taken for this visit.  Wt Readings from Last 3 Encounters:  04/30/23 153 lb 9.6 oz (69.7 kg)  10/29/22 154 lb (69.9 kg)  10/16/22 154 lb 9.6 oz (70.1 kg)    General: Appears his stated age, well developed, well nourished in NAD. Skin: Warm, dry and intact. No ulcerations noted. HEENT: Head: normal shape and size; Eyes: sclera white, no icterus, conjunctiva pink, PERRLA and EOMs intact;  Neck:  Neck supple, trachea midline. No masses, lumps or thyromegaly present.  Cardiovascular: Normal rate and rhythm. S1,S2 noted.  No murmur, rubs or gallops noted. No JVD or BLE edema. No carotid bruits noted. Pulmonary/Chest: Normal effort and positive vesicular breath sounds. No respiratory distress. No wheezes, rales or ronchi noted.  Abdomen: Normal bowel sounds.  Musculoskeletal: Strength 5/5 BUE/BLE. No difficulty with gait.  Neurological: Alert and oriented. Cranial nerves II-XII grossly intact. Coordination normal.  Psychiatric: Mood and affect normal. Behavior is normal. Judgment and thought content normal.     BMET    Component Value Date/Time   NA 138 04/30/2023 1343   NA 137 02/05/2013 2355   K 4.7 04/30/2023 1343   K 3.8 02/05/2013 2355   CL 103 04/30/2023 1343   CL 107 02/05/2013  2355   CO2 25 04/30/2023 1343   CO2 23 02/05/2013 2355   GLUCOSE 226 (H) 04/30/2023 1343   GLUCOSE 113 (H) 02/05/2013 2355   BUN 20 04/30/2023 1343   BUN 9 02/05/2013 2355   CREATININE 1.23 04/30/2023 1343   CALCIUM  8.7 04/30/2023 1343   CALCIUM  8.9 02/05/2013 2355   GFRNONAA >60 07/02/2021 1241   GFRNONAA 61 02/16/2020 0922   GFRAA 70 02/16/2020 0922    Lipid Panel     Component Value Date/Time   CHOL 93 04/30/2023 1343   TRIG 162 (H) 04/30/2023 1343   HDL 39 (L) 04/30/2023 1343   CHOLHDL 2.4 04/30/2023 1343   LDLCALC 30 04/30/2023 1343    CBC    Component Value Date/Time   WBC 4.9 04/30/2023 1343   RBC 3.97 (L) 04/30/2023 1343   HGB 12.2 (L) 04/30/2023 1343   HGB 13.0 02/05/2013 2355   HCT 35.8 (L) 04/30/2023 1343   HCT 36.7 (L) 02/05/2013 2355   PLT 166 04/30/2023 1343   PLT 106 (L) 02/05/2013 2355   MCV 90.2 04/30/2023 1343   MCV 93 02/05/2013 2355   MCH 30.7 04/30/2023 1343   MCHC 34.1 04/30/2023 1343   RDW 13.0 04/30/2023 1343   RDW 13.5 02/05/2013 2355   LYMPHSABS 1,864 02/16/2020 0922   LYMPHSABS 1.8 02/01/2013 1304   MONOABS 0.4 02/01/2013 1304   EOSABS 207 02/16/2020 0922   EOSABS 0.2 02/01/2013 1304   BASOSABS 30 02/16/2020 0922   BASOSABS 0.0 02/01/2013 1304    Hgb A1C Lab Results  Component Value Date   HGBA1C 6.4 (A) 04/30/2023            Assessment & Plan:   Preventative Health Maintenance:  Encouraged him to get a flu shot in the fall He declines tetanus for financial reasons, advised him if he gets bit or cut to go get this done Pneumovax and Prevnar UTD Encouraged him to get his COVID booster Discussed Shingrix vaccine, he will check coverage with his insurance company and schedule a visit if he would like to have this done Colon screening UTD Encouraged him to consume a balanced diet and exercise regimen Advised him to see an eye doctor and dentist annually We will  check CBC, c-Met, lipid, A1c, urine microalbumin and PSA  today  RTC in 6 months, follow-up chronic conditions Helayne Lo, NP

## 2023-11-20 ENCOUNTER — Ambulatory Visit

## 2023-12-03 ENCOUNTER — Other Ambulatory Visit: Payer: Self-pay | Admitting: Internal Medicine

## 2023-12-04 NOTE — Telephone Encounter (Signed)
 Requested Prescriptions  Pending Prescriptions Disp Refills   atorvastatin  (LIPITOR) 80 MG tablet [Pharmacy Med Name: ATORVASTATIN  CALCIUM  80 MG TAB] 90 tablet 1    Sig: TAKE 1 TABLET BY MOUTH ONCE EVERY EVENING     Cardiovascular:  Antilipid - Statins Failed - 12/04/2023  3:41 PM      Failed - Valid encounter within last 12 months    Recent Outpatient Visits   None            Failed - Lipid Panel in normal range within the last 12 months    Cholesterol  Date Value Ref Range Status  04/30/2023 93 <200 mg/dL Final   LDL Cholesterol (Calc)  Date Value Ref Range Status  04/30/2023 30 mg/dL (calc) Final    Comment:    Reference range: <100 . Desirable range <100 mg/dL for primary prevention;   <70 mg/dL for patients with CHD or diabetic patients  with > or = 2 CHD risk factors. [name], which is a validated novel method providing  better accuracy than the Friedewald equation in the  estimation of LDL-C.  [name] et al. [name]. 7986;689(80): 2061-2068  (http://education.[facility].com/faq/FAQ164)    HDL  Date Value Ref Range Status  04/30/2023 39 (L) > OR = 40 mg/dL Final   Triglycerides  Date Value Ref Range Status  04/30/2023 162 (H) <150 mg/dL Final         Passed - Patient is not pregnant

## 2023-12-17 ENCOUNTER — Encounter: Payer: Self-pay | Admitting: Emergency Medicine

## 2023-12-17 ENCOUNTER — Other Ambulatory Visit: Payer: Self-pay

## 2023-12-17 ENCOUNTER — Emergency Department
Admission: EM | Admit: 2023-12-17 | Discharge: 2023-12-17 | Disposition: A | Discharge status: Home / Self Care | Attending: Emergency Medicine | Admitting: Emergency Medicine

## 2023-12-17 ENCOUNTER — Ambulatory Visit
Admission: RE | Admit: 2023-12-17 | Discharge: 2023-12-17 | Disposition: A | Discharge status: Home / Self Care | Source: Ambulatory Visit | Attending: Acute Care | Admitting: Acute Care

## 2023-12-17 DIAGNOSIS — I129 Hypertensive chronic kidney disease with stage 1 through stage 4 chronic kidney disease, or unspecified chronic kidney disease: Secondary | ICD-10-CM | POA: Insufficient documentation

## 2023-12-17 DIAGNOSIS — E1122 Type 2 diabetes mellitus with diabetic chronic kidney disease: Secondary | ICD-10-CM | POA: Insufficient documentation

## 2023-12-17 DIAGNOSIS — K5900 Constipation, unspecified: Secondary | ICD-10-CM | POA: Diagnosis not present

## 2023-12-17 DIAGNOSIS — K625 Hemorrhage of anus and rectum: Secondary | ICD-10-CM | POA: Diagnosis present

## 2023-12-17 DIAGNOSIS — Z87891 Personal history of nicotine dependence: Secondary | ICD-10-CM

## 2023-12-17 DIAGNOSIS — N189 Chronic kidney disease, unspecified: Secondary | ICD-10-CM | POA: Diagnosis not present

## 2023-12-17 DIAGNOSIS — J449 Chronic obstructive pulmonary disease, unspecified: Secondary | ICD-10-CM | POA: Insufficient documentation

## 2023-12-17 DIAGNOSIS — Z122 Encounter for screening for malignant neoplasm of respiratory organs: Secondary | ICD-10-CM | POA: Diagnosis present

## 2023-12-17 DIAGNOSIS — K644 Residual hemorrhoidal skin tags: Secondary | ICD-10-CM | POA: Insufficient documentation

## 2023-12-17 LAB — COMPREHENSIVE METABOLIC PANEL WITH GFR
ALT: 21 U/L (ref 0–44)
AST: 24 U/L (ref 15–41)
Albumin: 4.1 g/dL (ref 3.5–5.0)
Alkaline Phosphatase: 58 U/L (ref 38–126)
Anion gap: 11 (ref 5–15)
BUN: 29 mg/dL — ABNORMAL HIGH (ref 8–23)
CO2: 18 mmol/L — ABNORMAL LOW (ref 22–32)
Calcium: 9.1 mg/dL (ref 8.9–10.3)
Chloride: 108 mmol/L (ref 98–111)
Creatinine, Ser: 1.57 mg/dL — ABNORMAL HIGH (ref 0.61–1.24)
GFR, Estimated: 47 mL/min — ABNORMAL LOW (ref >60)
Glucose, Bld: 118 mg/dL — ABNORMAL HIGH (ref 70–99)
Potassium: 4.2 mmol/L (ref 3.5–5.1)
Sodium: 137 mmol/L (ref 135–145)
Total Bilirubin: 0.9 mg/dL (ref 0.0–1.2)
Total Protein: 7.4 g/dL (ref 6.5–8.1)

## 2023-12-17 LAB — TYPE AND SCREEN
ABO/RH(D): A POS
Antibody Screen: NEGATIVE

## 2023-12-17 LAB — CBC
HCT: 31.3 % — ABNORMAL LOW (ref 39.0–52.0)
Hemoglobin: 11.4 g/dL — ABNORMAL LOW (ref 13.0–17.0)
MCH: 30.4 pg (ref 26.0–34.0)
MCHC: 36.4 g/dL — ABNORMAL HIGH (ref 30.0–36.0)
MCV: 83.5 fL (ref 80.0–100.0)
Platelets: 174 K/uL (ref 150–400)
RBC: 3.75 MIL/uL — ABNORMAL LOW (ref 4.22–5.81)
RDW: 12.9 % (ref 11.5–15.5)
WBC: 5.6 K/uL (ref 4.0–10.5)
nRBC: 0 % (ref 0.0–0.2)

## 2023-12-17 NOTE — ED Provider Notes (Signed)
 [facility] Provider Note    Event Date/Time   First MD Initiated Contact with Patient 12/17/23 1612     (approximate)   History   Chief Complaint Rectal Bleeding   HPI  [name] is a 68 y.o. male with past medical history of hypertension, diabetes, COPD, GERD, CKD, and hepatitis C who presents to the ED complaining of rectal bleeding.  Patient reports that he has been constipated for the past 3 days and straining for a bowel movement.  He has been taking laxatives without relief, went to take a bowel movement earlier this afternoon and noticed bright red blood in the bowl.  He denies any rectal pain, has not noticed any ongoing bleeding since this episode.  He has not had any abdominal pain, nausea, or vomiting.  He denies any history of GI bleeding and does not take a blood thinner.     Physical Exam   Triage Vital Signs: ED Triage Vitals  Encounter Vitals Group     BP 12/17/23 1349 (!) 140/81     Girls Systolic BP Percentile --      Girls Diastolic BP Percentile --      Boys Systolic BP Percentile --      Boys Diastolic BP Percentile --      Pulse Rate 12/17/23 1349 100     Resp 12/17/23 1349 17     Temp 12/17/23 1349 98.5 F (36.9 C)     Temp Source 12/17/23 1349 Oral     SpO2 12/17/23 1349 100 %     Weight 12/17/23 1348 143 lb (64.9 kg)     Height 12/17/23 1348 5' 6 (1.676 m)     Head Circumference --      Peak Flow --      Pain Score 12/17/23 1348 0     Pain Loc --      Pain Education --      Exclude from Growth Chart --     Most recent vital signs: Vitals:   12/17/23 1349  BP: (!) 140/81  Pulse: 100  Resp: 17  Temp: 98.5 F (36.9 C)  SpO2: 100%    Constitutional: Alert and oriented. Eyes: Conjunctivae are normal. Head: Atraumatic. Nose: No congestion/rhinnorhea. Mouth/Throat: Mucous membranes are moist.  Cardiovascular: Normal rate, regular rhythm. Grossly normal heart sounds.  2+ radial pulses  bilaterally. Respiratory: Normal respiratory effort.  No retractions. Lungs CTAB. Gastrointestinal: Soft and nontender. No distention.  Rectal exam with nonbleeding external hemorrhoid. Musculoskeletal: No lower extremity tenderness nor edema.  Neurologic:  Normal speech and language. No gross focal neurologic deficits are appreciated.    ED Results / Procedures / Treatments   Labs (all labs ordered are listed, but only abnormal results are displayed) Labs Reviewed  COMPREHENSIVE METABOLIC PANEL WITH GFR - Abnormal; Notable for the following components:      Result Value   CO2 18 (*)    Glucose, Bld 118 (*)    BUN 29 (*)    Creatinine, Ser 1.57 (*)    GFR, Estimated 47 (*)    All other components within normal limits  CBC - Abnormal; Notable for the following components:   RBC 3.75 (*)    Hemoglobin 11.4 (*)    HCT 31.3 (*)    MCHC 36.4 (*)    All other components within normal limits  POC OCCULT BLOOD, ED  TYPE AND SCREEN    PROCEDURES:  Critical Care performed: No  Procedures  MEDICATIONS ORDERED IN ED: Medications - No data to display   IMPRESSION / MDM / ASSESSMENT AND PLAN / ED COURSE  I reviewed the triage vital signs and the nursing notes.                              68 y.o. male with past medical history of hypertension, diabetes, COPD, GERD, CKD, and hepatitis C who presents to the ED complaining of 3 days of constipation with rectal bleeding today.  Patient's presentation is most consistent with acute presentation with potential threat to life or bodily function.  Differential diagnosis includes, but is not limited to, upper GI bleed, lower GI bleed, hemorrhoids, anal fissure, constipation, bowel obstruction.  Patient nontoxic-appearing and in no acute distress, vital signs are unremarkable.  He has a benign abdominal exam and rectal exam shows nonbleeding external hemorrhoid.  Labs are reassuring with no significant anemia, leukocytosis, electrolyte  abnormality, or AKI.  Low suspicion for significant GI bleed at this time, suspect rectal bleeding due to hemorrhoid associated with straining for a bowel movement.  Patient counseled on over-the-counter medications for constipation and counseled to follow-up with his PCP, otherwise return to the ED for new or worsening symptoms.  Patient agrees with plan.      FINAL CLINICAL IMPRESSION(S) / ED DIAGNOSES   Final diagnoses:  Rectal bleeding  Constipation, unspecified constipation type     Rx / DC Orders   ED Discharge Orders     None        Note:  This document was prepared using Dragon voice recognition software and may include unintentional dictation errors.   Willo Dunnings, MD 12/17/23 [phone]

## 2023-12-17 NOTE — ED Triage Notes (Signed)
 Patient to ED via POV for rectal bleeding. Started today after trying to have a BM for 2 days. Took stool softer and laxative. Dark red in color. Able to have small BM today.

## 2023-12-17 NOTE — Discharge Instructions (Signed)
 You were seen in the emergency department today for constipation.  We recommend that you use one or more of the following over-the-counter medications in the order described:   1)  Colace (or Dulcolax) 100 mg:  This is a stool softener, and you may take it once or twice a day as needed. 2)  Senna tablets:  This is a bowel stimulant that will help "push" out your stool. It is the next step to add after you have tried a stool softener. 3)  Miralax (powder):  This medication works by drawing additional fluid into your intestines and helps to flush out your stool.  Mix the powder with water or juice according to label instructions.  It may help if the Colace and Senna are not sufficient, but you must be sure to use the recommended amount of water or juice when you mix up the powder. 4)  Look for magnesium citrate at the pharmacy (it is usually a small glass bottle).  Drink the bottle according to the label instructions.  Remember that narcotic pain medications are constipating, so avoid them or minimize their use.  Drink plenty of fluids.  Please return to the Emergency Department immediately if you develop new or worsening symptoms that concern you, such as (but not limited to) fever > 101 degrees, severe abdominal pain, or persistent vomiting.

## 2023-12-25 ENCOUNTER — Other Ambulatory Visit: Payer: Self-pay

## 2023-12-25 DIAGNOSIS — Z122 Encounter for screening for malignant neoplasm of respiratory organs: Secondary | ICD-10-CM

## 2023-12-25 DIAGNOSIS — Z87891 Personal history of nicotine dependence: Secondary | ICD-10-CM

## 2023-12-30 ENCOUNTER — Other Ambulatory Visit: Payer: Self-pay | Admitting: Internal Medicine

## 2024-01-01 NOTE — Telephone Encounter (Signed)
 Requested Prescriptions  Refused Prescriptions Disp Refills   ezetimibe  (ZETIA ) 10 MG tablet [Pharmacy Med Name: EZETIMIBE  10 MG TAB] 90 tablet 1    Sig: TAKE 1 TABLET BY MOUTH ONCE DAILY     There is no refill protocol information for this order

## 2024-01-20 ENCOUNTER — Other Ambulatory Visit: Payer: Self-pay | Admitting: Internal Medicine

## 2024-01-20 DIAGNOSIS — I10 Essential (primary) hypertension: Secondary | ICD-10-CM

## 2024-01-20 DIAGNOSIS — E1169 Type 2 diabetes mellitus with other specified complication: Secondary | ICD-10-CM

## 2024-01-21 NOTE — Telephone Encounter (Signed)
 Requested Prescriptions  Refused Prescriptions Disp Refills   tamsulosin  (FLOMAX ) 0.4 MG CAPS capsule [Pharmacy Med Name: TAMSULOSIN  HCL 0.4 MG CAP] 90 capsule 1    Sig: TAKE 1 CAPSULE BY MOUTH ONCE DAILY 30 MINUTES AFTER LARGEST MEAL.     Urology: Alpha-Adrenergic Blocker Failed - 01/21/2024  5:31 PM      Failed - PSA in normal range and within 360 days    PSA  Date Value Ref Range Status  10/29/2022 0.55 < OR = 4.00 ng/mL Final    Comment:    The total PSA value from this assay system is  standardized against the WHO standard. The test  result will be approximately 20% lower when compared  to the equimolar-standardized total PSA (Beckman  Coulter). Comparison of serial PSA results should be  interpreted with this fact in mind. . This test was performed using the Siemens  chemiluminescent method. Values obtained from  different assay methods cannot be used interchangeably. PSA levels, regardless of value, should not be interpreted as absolute evidence of the presence or absence of disease.          Failed - Last BP in normal range    BP Readings from Last 1 Encounters:  12/17/23 (!) 144/82         Failed - Valid encounter within last 12 months    Recent Outpatient Visits   None             lisinopril  (ZESTRIL ) 20 MG tablet [Pharmacy Med Name: LISINOPRIL  20 MG TAB] 90 tablet 1    Sig: TAKE 1 TABLET BY MOUTH ONCE DAILY HIGH BLOOD PRESSURE     Cardiovascular:  ACE Inhibitors Failed - 01/21/2024  5:31 PM      Failed - Cr in normal range and within 180 days    Creat  Date Value Ref Range Status  04/30/2023 1.23 0.70 - 1.35 mg/dL Final   Creatinine, Ser  Date Value Ref Range Status  12/17/2023 1.57 (H) 0.61 - 1.24 mg/dL Final   Creatinine, Urine  Date Value Ref Range Status  10/29/2022 181 20 - 320 mg/dL Final         Failed - Last BP in normal range    BP Readings from Last 1 Encounters:  12/17/23 (!) 144/82         Failed - Valid encounter within last 6  months    Recent Outpatient Visits   None            Passed - K in normal range and within 180 days    Potassium  Date Value Ref Range Status  12/17/2023 4.2 3.5 - 5.1 mmol/L Final  02/05/2013 3.8 3.5 - 5.1 mmol/L Final         Passed - Patient is not pregnant       ezetimibe  (ZETIA ) 10 MG tablet [Pharmacy Med Name: EZETIMIBE  10 MG TAB] 90 tablet 1    Sig: TAKE 1 TABLET BY MOUTH ONCE DAILY     Cardiovascular:  Antilipid - Sterol Transport Inhibitors Failed - 01/21/2024  5:31 PM      Failed - Valid encounter within last 12 months    Recent Outpatient Visits   None            Failed - Lipid Panel in normal range within the last 12 months    Cholesterol  Date Value Ref Range Status  04/30/2023 93 <200 mg/dL Final   LDL Cholesterol (Calc)  Date Value Ref Range  Status  04/30/2023 30 mg/dL (calc) Final    Comment:    Reference range: <100 . Desirable range <100 mg/dL for primary prevention;   <70 mg/dL for patients with CHD or diabetic patients  with > or = 2 CHD risk factors. [name], which is a validated novel method providing  better accuracy than the Friedewald equation in the  estimation of LDL-C.  [name] et al. [name]. 7986;689(80): 2061-2068  (http://education.[facility].com/faq/FAQ164)    HDL  Date Value Ref Range Status  04/30/2023 39 (L) > OR = 40 mg/dL Final   Triglycerides  Date Value Ref Range Status  04/30/2023 162 (H) <150 mg/dL Final         Passed - AST in normal range and within 360 days    AST  Date Value Ref Range Status  12/17/2023 24 15 - 41 U/L Final   SGOT(AST)  Date Value Ref Range Status  02/05/2013 91 (H) 15 - 37 Unit/L Final         Passed - ALT in normal range and within 360 days    ALT  Date Value Ref Range Status  12/17/2023 21 0 - 44 U/L Final   SGPT (ALT)  Date Value Ref Range Status  02/05/2013 144 (H) 12 - 78 U/L Final         Passed - Patient  is not pregnant

## 2024-02-11 ENCOUNTER — Other Ambulatory Visit: Payer: Self-pay | Admitting: Internal Medicine

## 2024-02-11 DIAGNOSIS — K219 Gastro-esophageal reflux disease without esophagitis: Secondary | ICD-10-CM

## 2024-02-19 ENCOUNTER — Other Ambulatory Visit: Payer: Self-pay | Admitting: Internal Medicine

## 2024-02-19 DIAGNOSIS — K219 Gastro-esophageal reflux disease without esophagitis: Secondary | ICD-10-CM

## 2024-02-22 NOTE — Telephone Encounter (Signed)
 Duplicate request, no longer under prescriber care.  Requested Prescriptions  Pending Prescriptions Disp Refills   omeprazole  (PRILOSEC) 20 MG capsule [Pharmacy Med Name: OMEPRAZOLE  DR 20 MG CAP] 90 capsule 1    Sig: TAKE 1 CAPSULE BY MOUTH ONCE DAILY     Gastroenterology: Proton Pump Inhibitors Failed - 02/22/2024  9:38 AM      Failed - Valid encounter within last 12 months    Recent Outpatient Visits   None
